# Patient Record
Sex: Male | Born: 1960 | Race: Black or African American | Hispanic: No | Marital: Married | Smoking: Current every day smoker
Health system: Southern US, Community
[De-identification: ages and names within clinical notes are randomized; demographics above are authoritative.]

## PROBLEM LIST (undated history)

## (undated) ENCOUNTER — Emergency Department (HOSPITAL_COMMUNITY): Payer: BLUE CROSS/BLUE SHIELD | Source: Home / Self Care

## (undated) DIAGNOSIS — K759 Inflammatory liver disease, unspecified: Secondary | ICD-10-CM

## (undated) DIAGNOSIS — I10 Essential (primary) hypertension: Secondary | ICD-10-CM

## (undated) DIAGNOSIS — E119 Type 2 diabetes mellitus without complications: Secondary | ICD-10-CM

## (undated) DIAGNOSIS — Z87442 Personal history of urinary calculi: Secondary | ICD-10-CM

## (undated) DIAGNOSIS — N1832 Chronic kidney disease, stage 3b: Secondary | ICD-10-CM

## (undated) DIAGNOSIS — E7800 Pure hypercholesterolemia, unspecified: Secondary | ICD-10-CM

## (undated) HISTORY — PX: TONSILLECTOMY: SUR1361

---

## 2005-05-09 ENCOUNTER — Emergency Department: Payer: Self-pay | Admitting: Emergency Medicine

## 2005-05-09 IMAGING — CT CT ABD-PELV W/O CM
1 of 2 series · 15 of 32 positions shown, 19 images · non-contrast
Comparison: none

REASON FOR EXAM: Abdominal pain, evaluate stone RM 6
COMMENTS:

[Series 2: stone · axial · 0.75mm/px · z∈[+105,+528]mm · 15 of 159 slices shown, 19 images]
[im 12/159  soft-tissue]
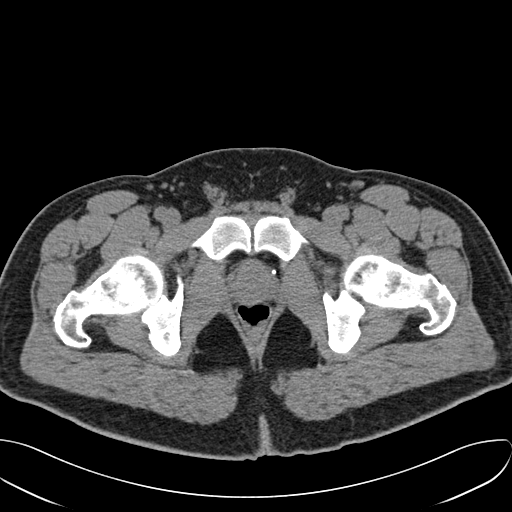
[im 12/159  bone]
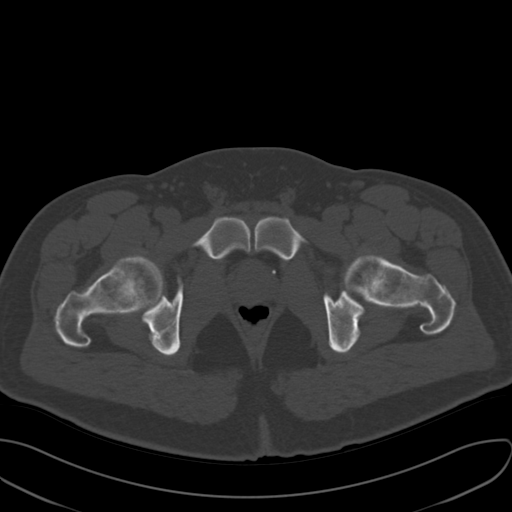
[im 23/159  soft-tissue]
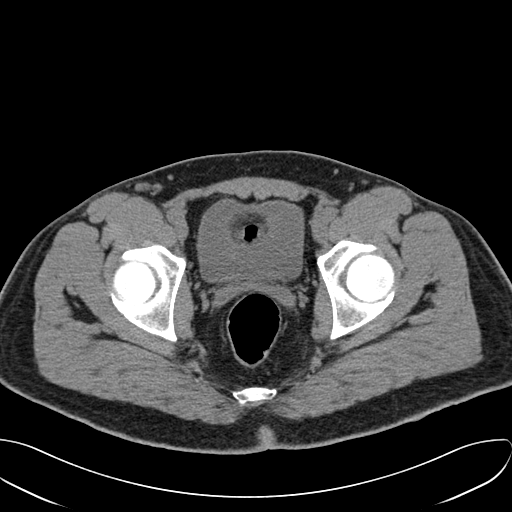
[im 34/159  soft-tissue]
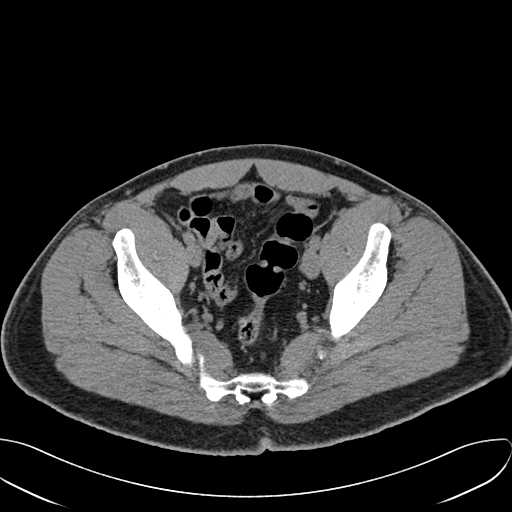
[im 46/159  soft-tissue]
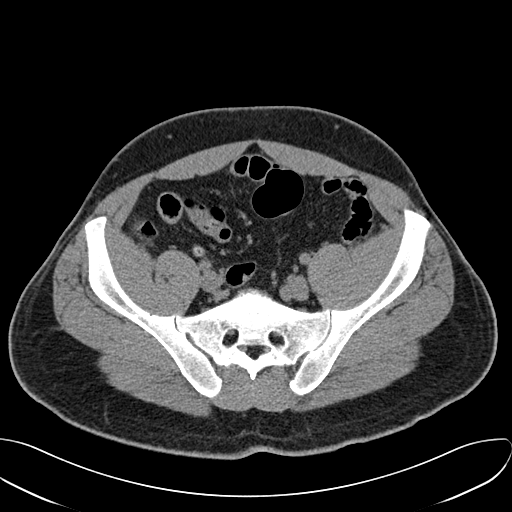
[im 57/159  soft-tissue]
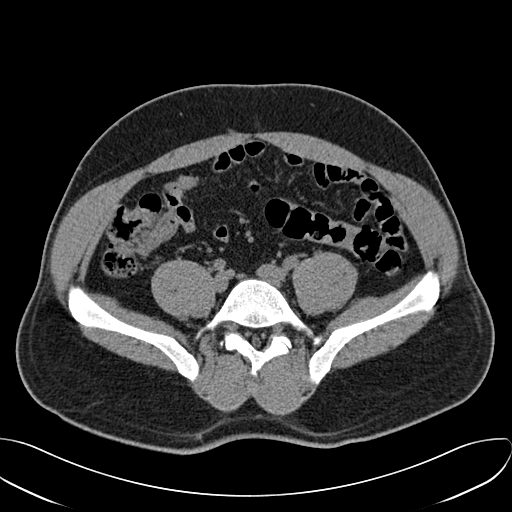
[im 68/159  soft-tissue]
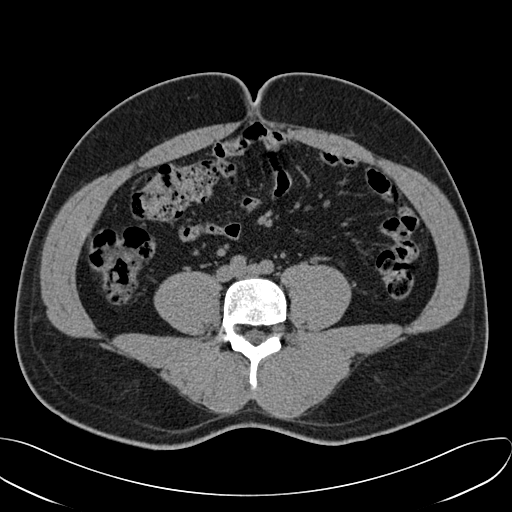
[im 80/159  soft-tissue]
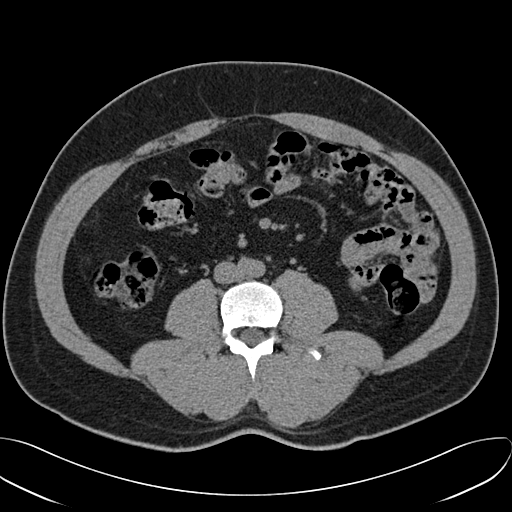
[im 91/159  soft-tissue]
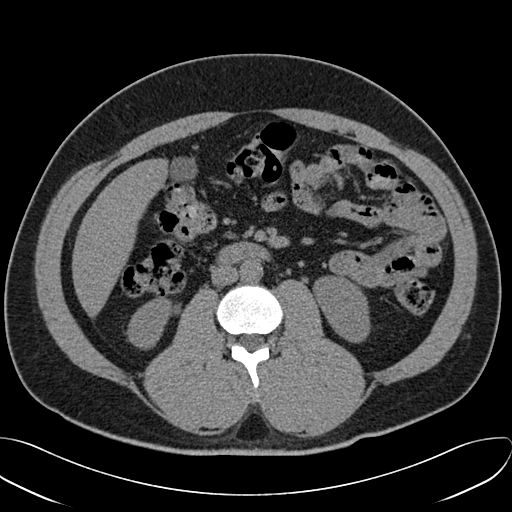
[im 102/159  soft-tissue]
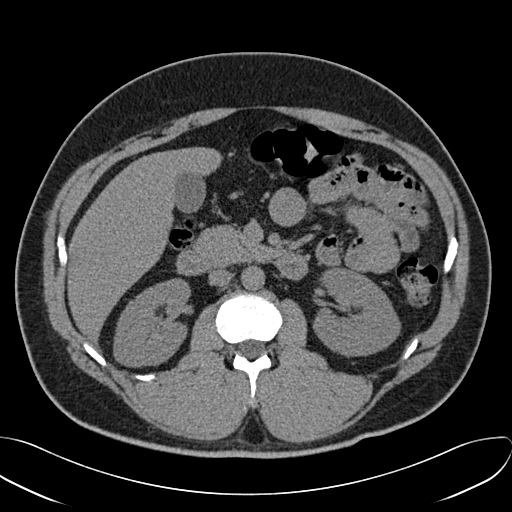
[im 102/159  bone]
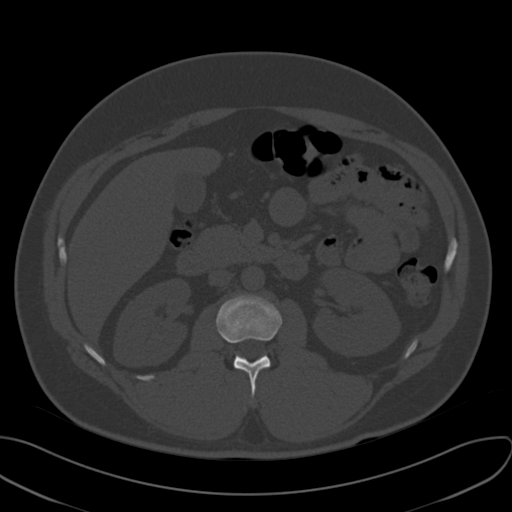
[im 113/159  soft-tissue]
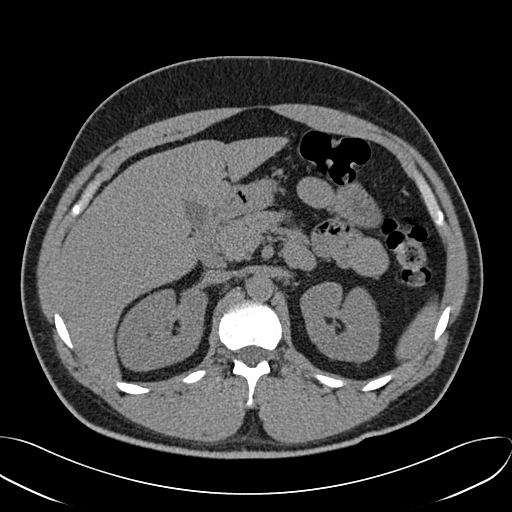
[im 125/159  soft-tissue]
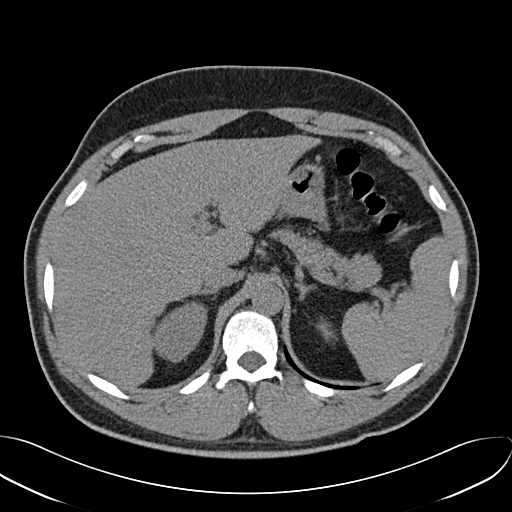
[im 136/159  soft-tissue]
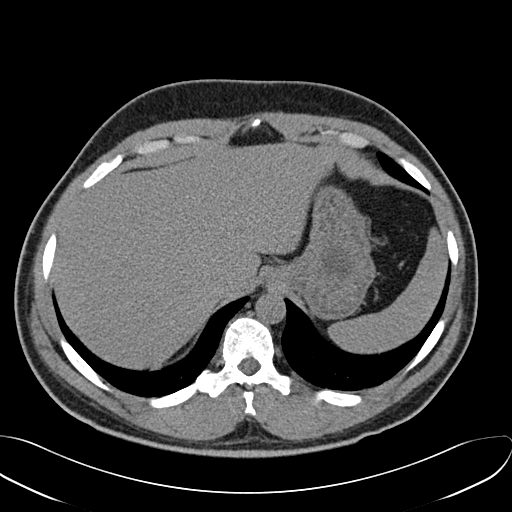
[im 136/159  lung]
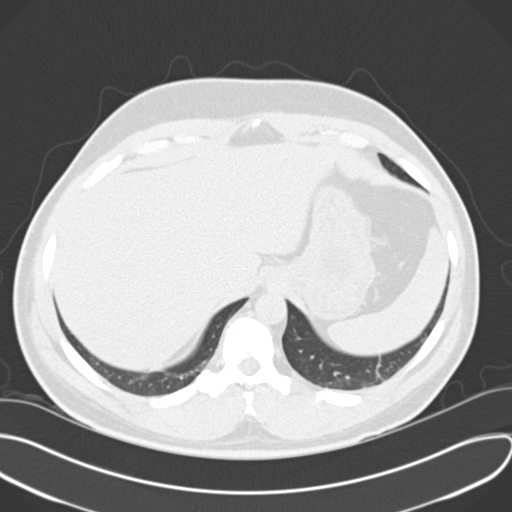
[im 142/159  lung]
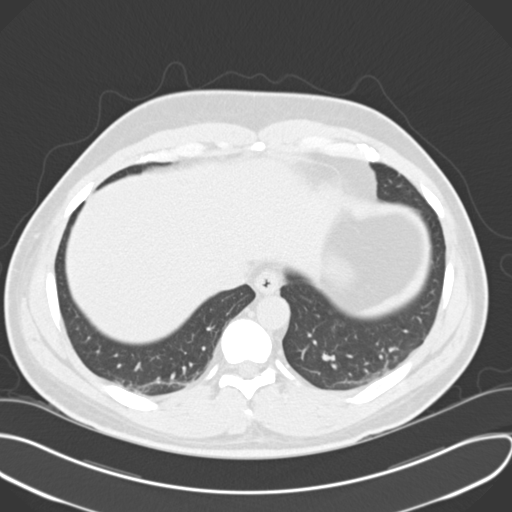
[im 147/159  soft-tissue]
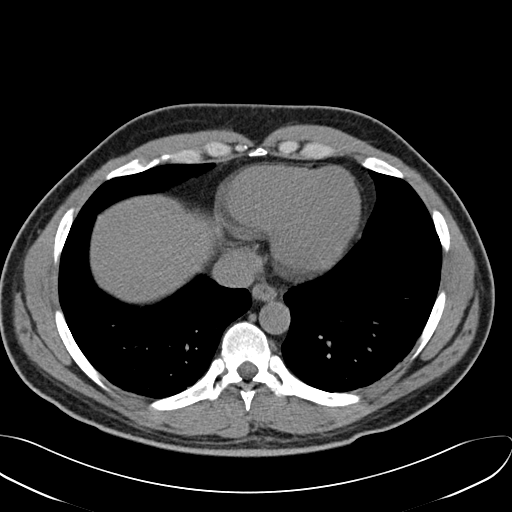
[im 147/159  lung]
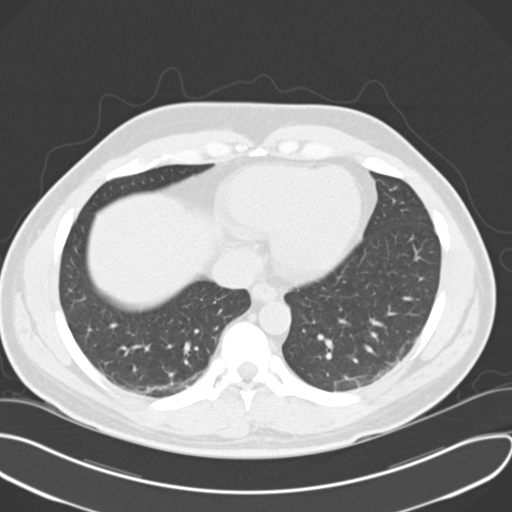
[im 153/159  lung]
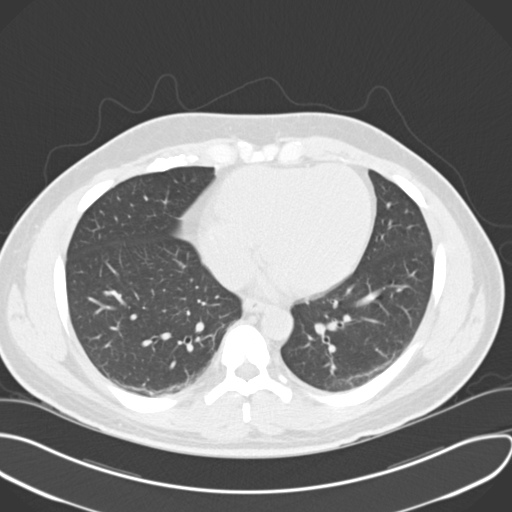

[15 of 32 positions shown; findings below may reference images not displayed]

PROCEDURE:     CT  - CT ABDOMEN AND PELVIS W[DATE] [DATE]

RESULT:     Emergent abdominal and pelvic CT was performed for abdominal
pain.  No renal calculi are identified.  No hydronephrosis is seen.  There
is an approximately 2 mm calculus in the LEFT posterior urinary bladder
which may be secondary to recent passage of a calculus.  No calculi is noted
within the ureters.  No hydroureters are identified.  No fluid is noted in
the abdomen or pelvis.  No evidence of diverticulitis or appendicitis is
noted.  The images through the lung bases reveals the lung bases to be clear
with no effusions.
IMPRESSION: Findings which can be compatible with recent passage of calculus on the LEFT
with a 2-mm calculus in the LEFT urinary bladder.  No hydronephrosis is
seen.  No renal calculi are noted.  The report was called to the emergency
room at the conclusion of the dictation.

## 2005-12-10 ENCOUNTER — Emergency Department: Payer: Self-pay | Admitting: Internal Medicine

## 2016-03-22 ENCOUNTER — Other Ambulatory Visit: Payer: Self-pay | Admitting: Gastroenterology

## 2016-03-22 DIAGNOSIS — B182 Chronic viral hepatitis C: Secondary | ICD-10-CM

## 2016-03-26 ENCOUNTER — Ambulatory Visit
Admission: RE | Admit: 2016-03-26 | Discharge: 2016-03-26 | Disposition: A | Discharge status: Home / Self Care | Payer: BLUE CROSS/BLUE SHIELD | Source: Ambulatory Visit | Attending: Gastroenterology | Admitting: Gastroenterology

## 2016-03-26 ENCOUNTER — Ambulatory Visit: Payer: BLUE CROSS/BLUE SHIELD

## 2016-07-09 ENCOUNTER — Ambulatory Visit
Admission: RE | Admit: 2016-07-09 | Payer: BLUE CROSS/BLUE SHIELD | Source: Ambulatory Visit | Admitting: Gastroenterology

## 2016-07-09 ENCOUNTER — Encounter: Admission: RE | Payer: Self-pay | Source: Ambulatory Visit

## 2016-07-09 SURGERY — COLONOSCOPY WITH PROPOFOL
Anesthesia: General

## 2016-08-07 ENCOUNTER — Other Ambulatory Visit: Payer: Self-pay | Admitting: Gastroenterology

## 2016-08-07 DIAGNOSIS — B1920 Unspecified viral hepatitis C without hepatic coma: Secondary | ICD-10-CM

## 2016-08-15 ENCOUNTER — Ambulatory Visit
Admission: RE | Admit: 2016-08-15 | Discharge: 2016-08-15 | Disposition: A | Discharge status: Home / Self Care | Payer: BLUE CROSS/BLUE SHIELD | Source: Ambulatory Visit | Attending: Gastroenterology | Admitting: Gastroenterology

## 2016-08-15 DIAGNOSIS — B1920 Unspecified viral hepatitis C without hepatic coma: Secondary | ICD-10-CM | POA: Insufficient documentation

## 2016-10-29 ENCOUNTER — Encounter: Payer: Self-pay | Admitting: *Deleted

## 2017-02-10 IMAGING — US US ABDOMEN COMPLETE W/ ELASTOGRAPHY
1 series · 13 of 25 positions shown · non-contrast
Comparison: None ; correlation CT abdomen and pelvis [DATE]

CLINICAL DATA: Hepatitis-C infection without hepatic coma



[Series 1: us abdomen complete w/ elastography · 0.22mm/px · 13 of 141 slices shown]
[im 1/141]
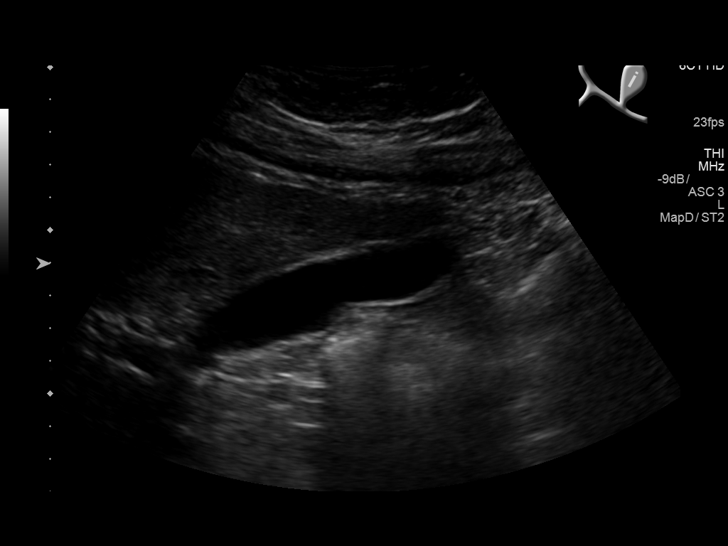
[im 12/141]
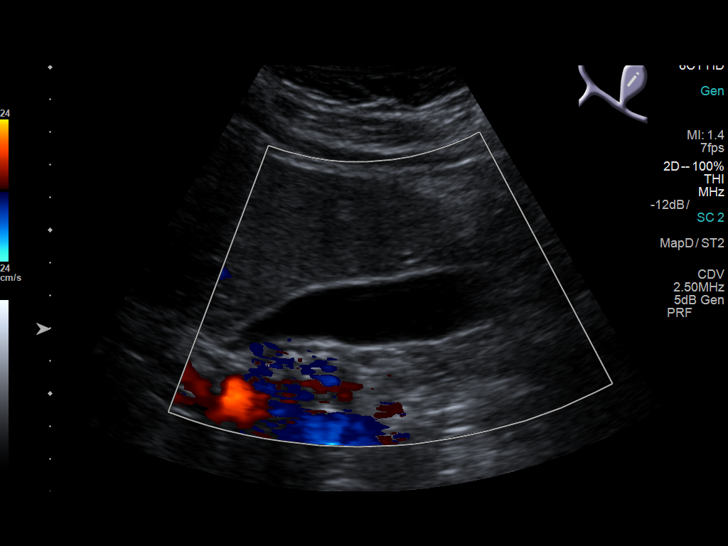
[im 24/141]
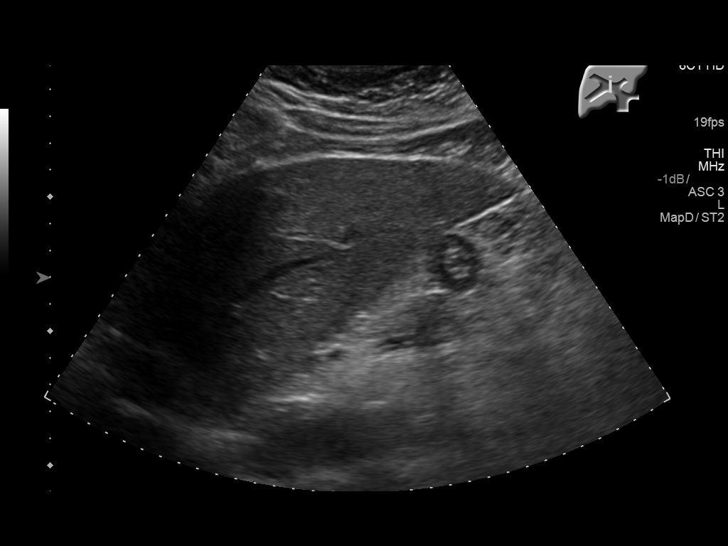
[im 36/141]
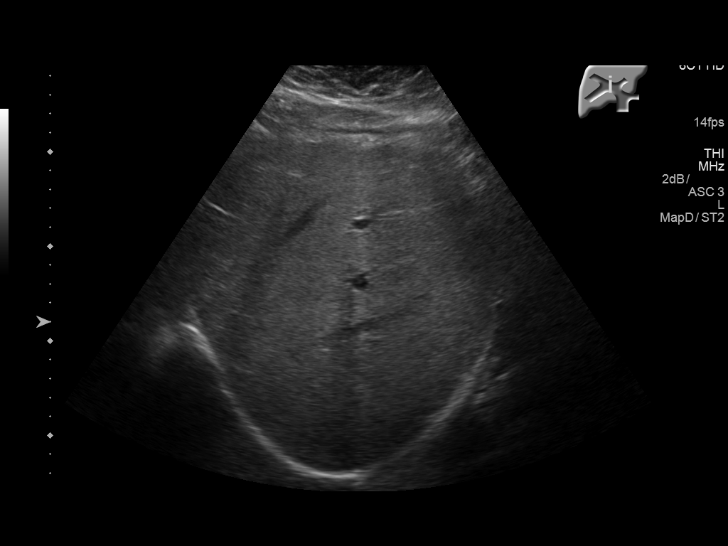
[im 47/141]
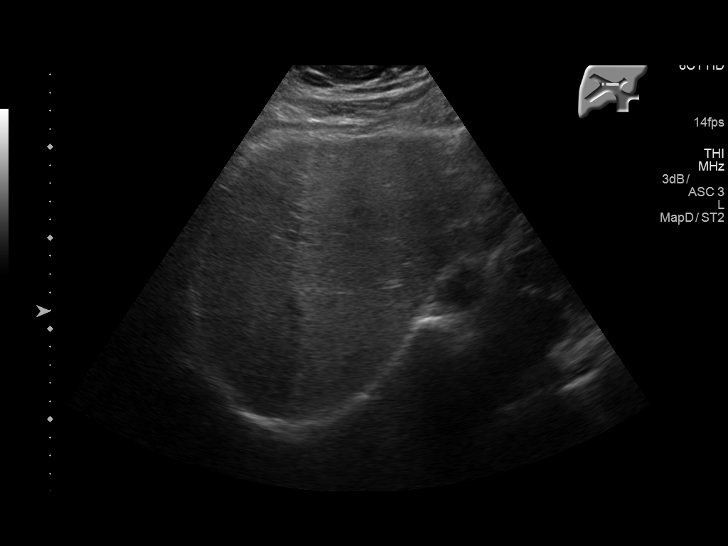
[im 59/141]
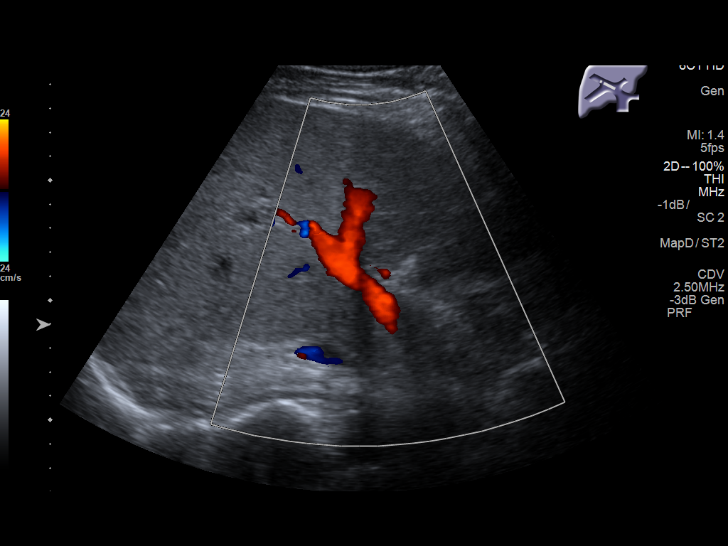
[im 71/141]
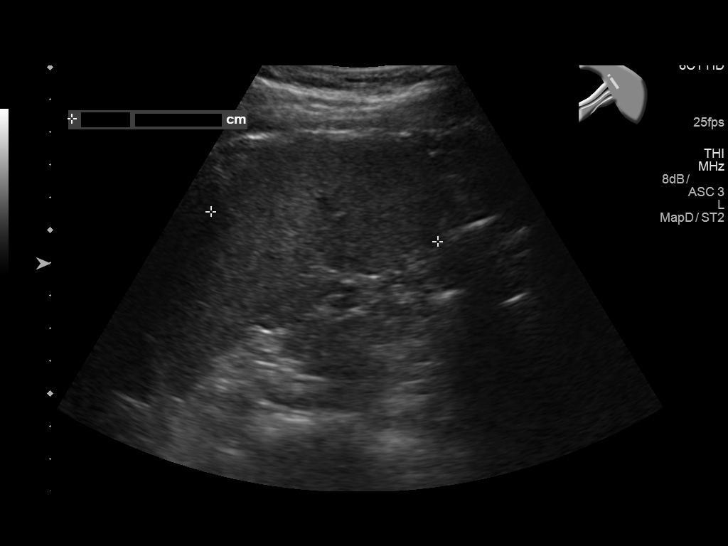
[im 82/141]
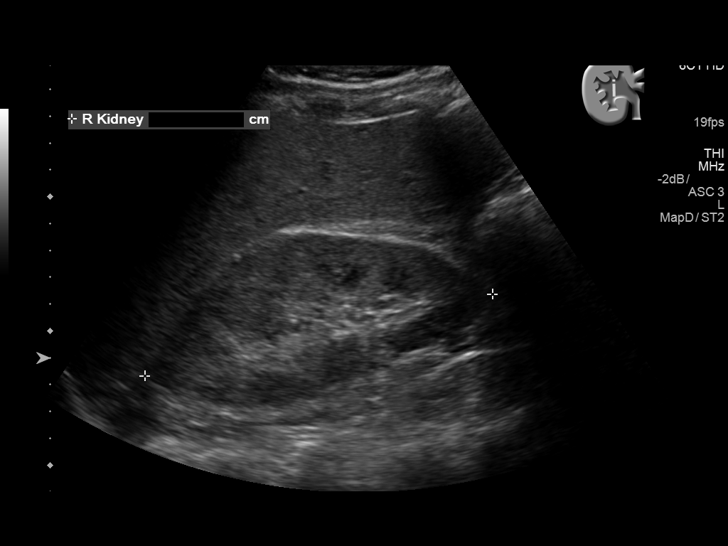
[im 94/141]
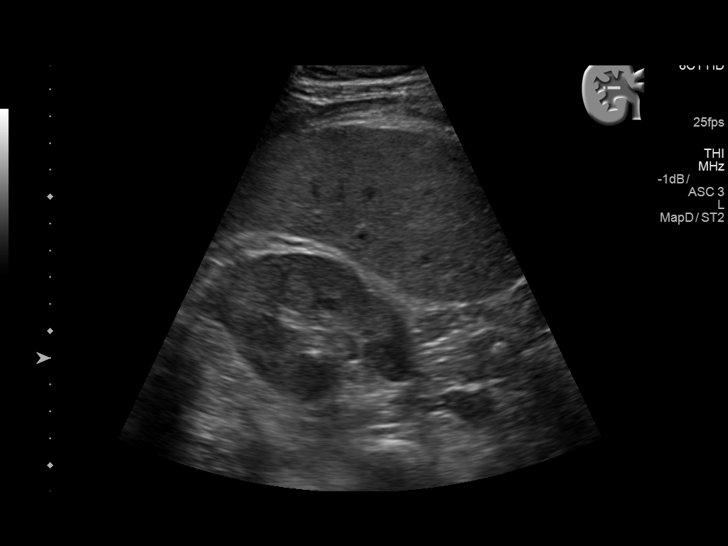
[im 106/141]
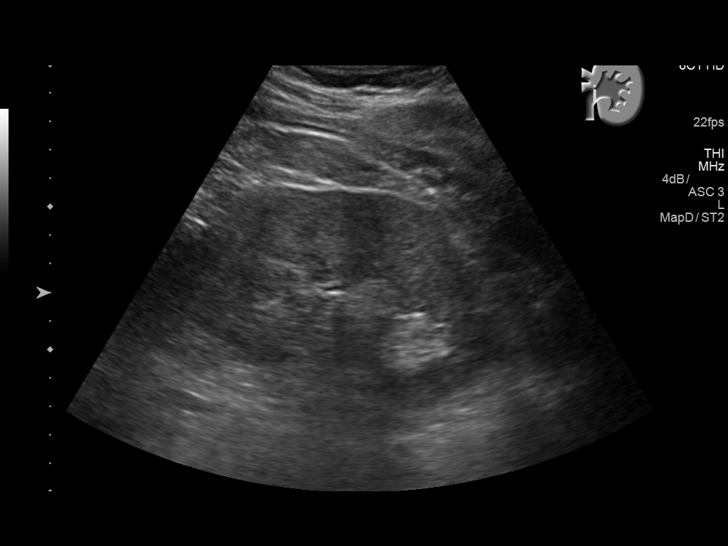
[im 117/141]
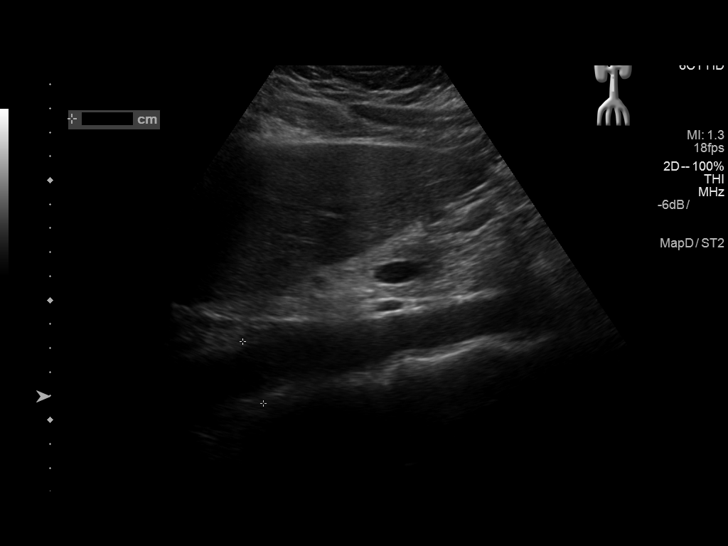
[im 129/141]
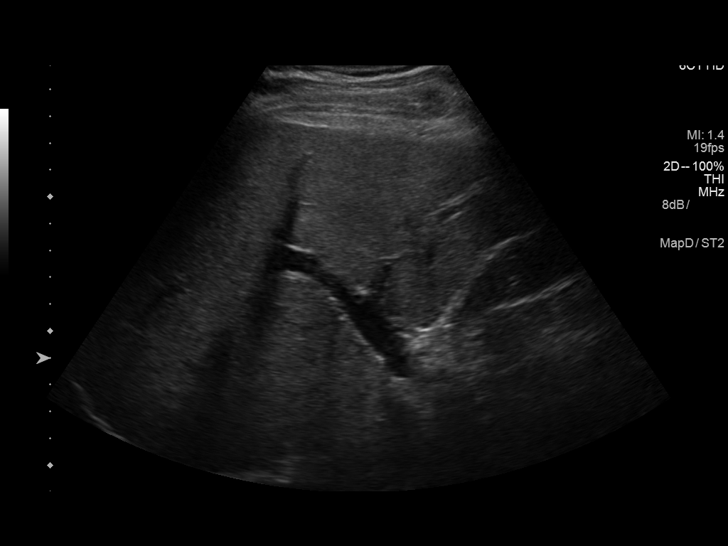
[im 141/141]
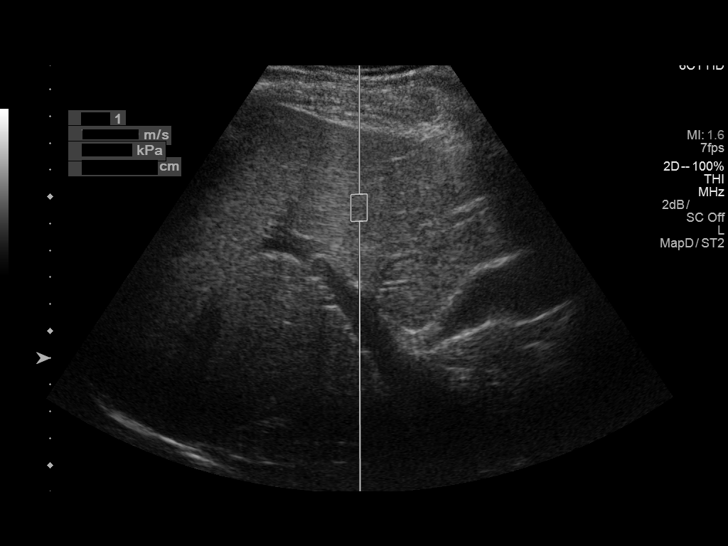

[13 of 25 positions shown; findings below may reference images not displayed]

FINDINGS: ULTRASOUND ABDOMEN

Gallbladder: Normally distended without stones or wall thickening.
No pericholecystic fluid or sonographic Murphy sign.

Common bile duct: Diameter: 3 mm diameter , normal

Liver: Minimally increased echogenicity. No definite mass or
nodules. Hepatopetal portal venous flow.

IVC: Normal appearance

Pancreas: Normal appearance

Spleen: Normal appearance, 8.6 cm length

Right Kidney: Length: 13.3 cm. Normal morphology without mass or
hydronephrosis.

Left Kidney: Length: 12.9 cm. Normal morphology without mass or
hydronephrosis.

Abdominal aorta: Normal caliber

Other findings: No free fluid

ULTRASOUND HEPATIC ELASTOGRAPHY

Device: Siemens Helix VTQ

Patient position: Supine

Transducer [id number]

Number of measurements: 10

Hepatic segment:  8

Median velocity:   1.74  m/sec

IQR:

IQR/Median velocity ratio: 0.1-6

Corresponding Metavir fibrosis score: F2 & some F3

Risk of fibrosis: Moderate

Limitations of exam: None

Pertinent findings noted on other imaging exams:  None

Please note that abnormal shear wave velocities may also be
identified in clinical settings other than with hepatic fibrosis,
such as: acute hepatitis, elevated right heart and central venous
pressures including use of beta blockers, AGAETISSON disease
(AGAETISSON), infiltrative processes such as
mastocytosis/amyloidosis/infiltrative tumor, extrahepatic
cholestasis, in the post-prandial state, and liver transplantation.
Correlation with patient history, laboratory data, and clinical
condition recommended.
IMPRESSION: ULTRASOUND ABDOMEN:
Increased hepatic echogenicity which can be seen with fatty
infiltration, cirrhosis, and certain infiltrative disorders

ULTRASOUND HEPATIC ELASTOGRAPHY:

Median hepatic shear wave velocity is calculated at 1.74 m/sec.

Corresponding Metavir fibrosis score is  F2 & some F3 .

Risk of fibrosis is moderate.

Follow-up: Additional testing appropriate

## 2017-03-11 ENCOUNTER — Encounter: Admission: RE | Payer: Self-pay | Source: Ambulatory Visit

## 2017-03-11 ENCOUNTER — Ambulatory Visit
Admission: RE | Admit: 2017-03-11 | Payer: BLUE CROSS/BLUE SHIELD | Source: Ambulatory Visit | Admitting: Gastroenterology

## 2017-03-11 HISTORY — DX: Inflammatory liver disease, unspecified: K75.9

## 2017-03-11 HISTORY — DX: Type 2 diabetes mellitus without complications: E11.9

## 2017-03-11 HISTORY — DX: Essential (primary) hypertension: I10

## 2017-03-11 HISTORY — DX: Pure hypercholesterolemia, unspecified: E78.00

## 2017-03-11 SURGERY — COLONOSCOPY WITH PROPOFOL
Anesthesia: General

## 2017-04-09 ENCOUNTER — Emergency Department: Payer: BLUE CROSS/BLUE SHIELD

## 2017-04-09 ENCOUNTER — Other Ambulatory Visit: Payer: Self-pay

## 2017-04-09 ENCOUNTER — Emergency Department
Admission: EM | Admit: 2017-04-09 | Discharge: 2017-04-09 | Disposition: A | Discharge status: Home / Self Care | Payer: BLUE CROSS/BLUE SHIELD | Attending: Emergency Medicine | Admitting: Emergency Medicine

## 2017-04-09 ENCOUNTER — Encounter: Payer: Self-pay | Admitting: Emergency Medicine

## 2017-04-09 DIAGNOSIS — I10 Essential (primary) hypertension: Secondary | ICD-10-CM | POA: Diagnosis not present

## 2017-04-09 DIAGNOSIS — Z79899 Other long term (current) drug therapy: Secondary | ICD-10-CM | POA: Diagnosis not present

## 2017-04-09 DIAGNOSIS — F17200 Nicotine dependence, unspecified, uncomplicated: Secondary | ICD-10-CM | POA: Insufficient documentation

## 2017-04-09 DIAGNOSIS — M5137 Other intervertebral disc degeneration, lumbosacral region: Secondary | ICD-10-CM | POA: Diagnosis not present

## 2017-04-09 DIAGNOSIS — R202 Paresthesia of skin: Secondary | ICD-10-CM | POA: Diagnosis not present

## 2017-04-09 DIAGNOSIS — Z794 Long term (current) use of insulin: Secondary | ICD-10-CM | POA: Insufficient documentation

## 2017-04-09 DIAGNOSIS — E119 Type 2 diabetes mellitus without complications: Secondary | ICD-10-CM | POA: Insufficient documentation

## 2017-04-09 DIAGNOSIS — M79604 Pain in right leg: Secondary | ICD-10-CM | POA: Diagnosis present

## 2017-04-09 IMAGING — CR DG LUMBAR SPINE COMPLETE 4+V
1 series · 5 of 5 positions shown · non-contrast
Comparison: None.

CLINICAL DATA: Low back pain

EXAM:
LUMBAR SPINE - COMPLETE 4+ VIEW

[Series 1: dg lumbar spine complete 4 +v · 0.14mm/px · 5 of 5 slices shown]
[im 1/5]
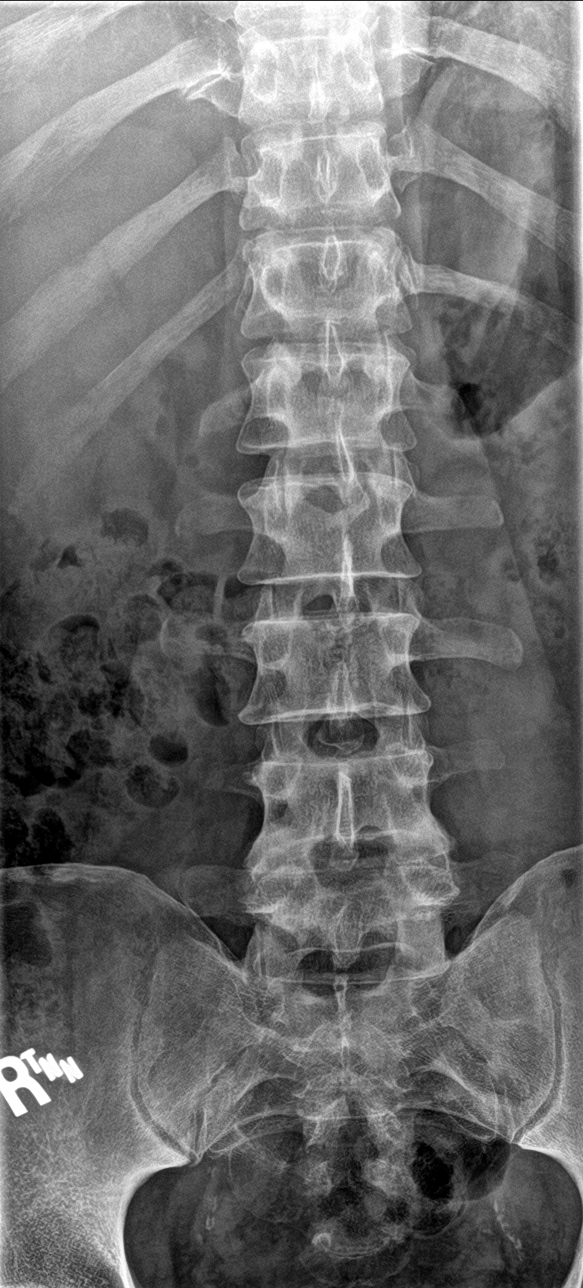
[im 2/5]
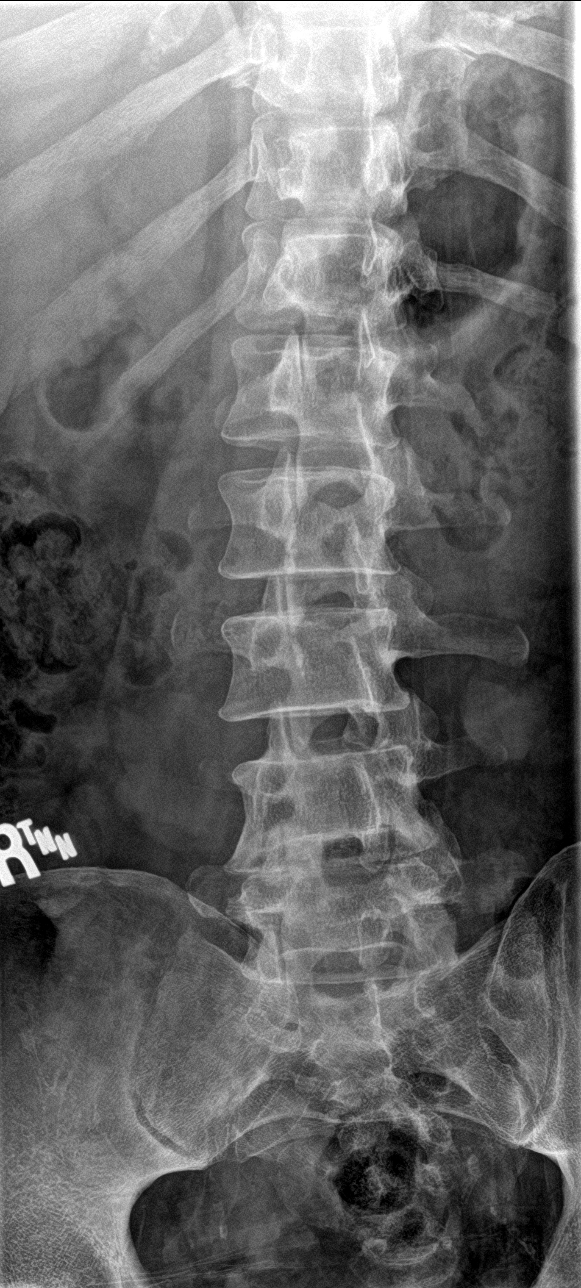
[im 3/5]
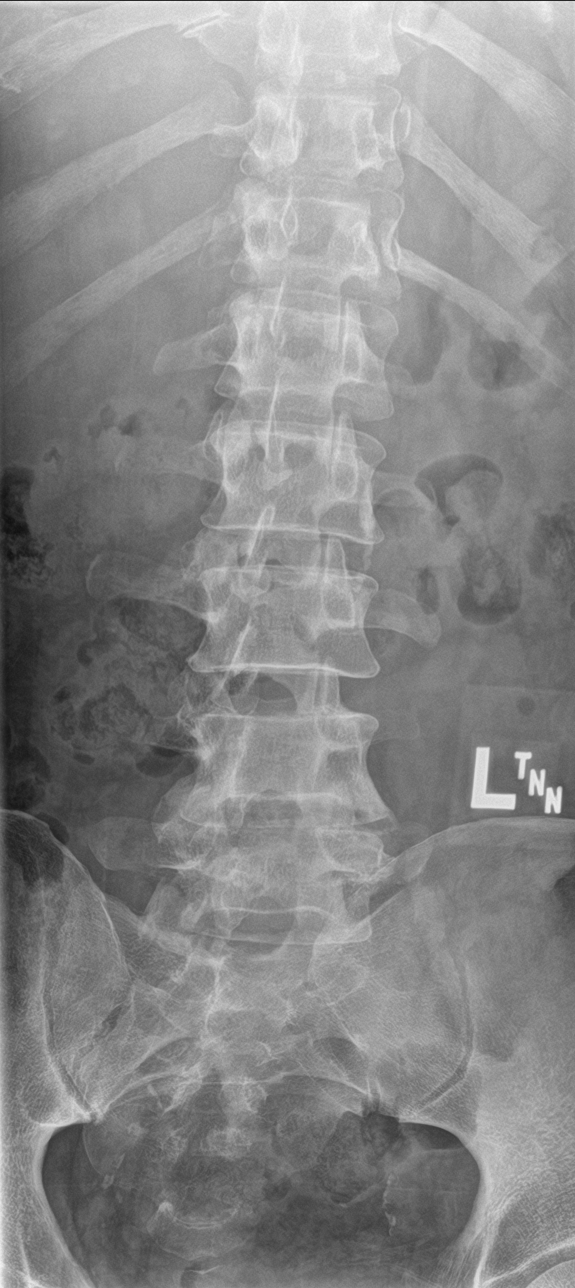
[im 4/5]
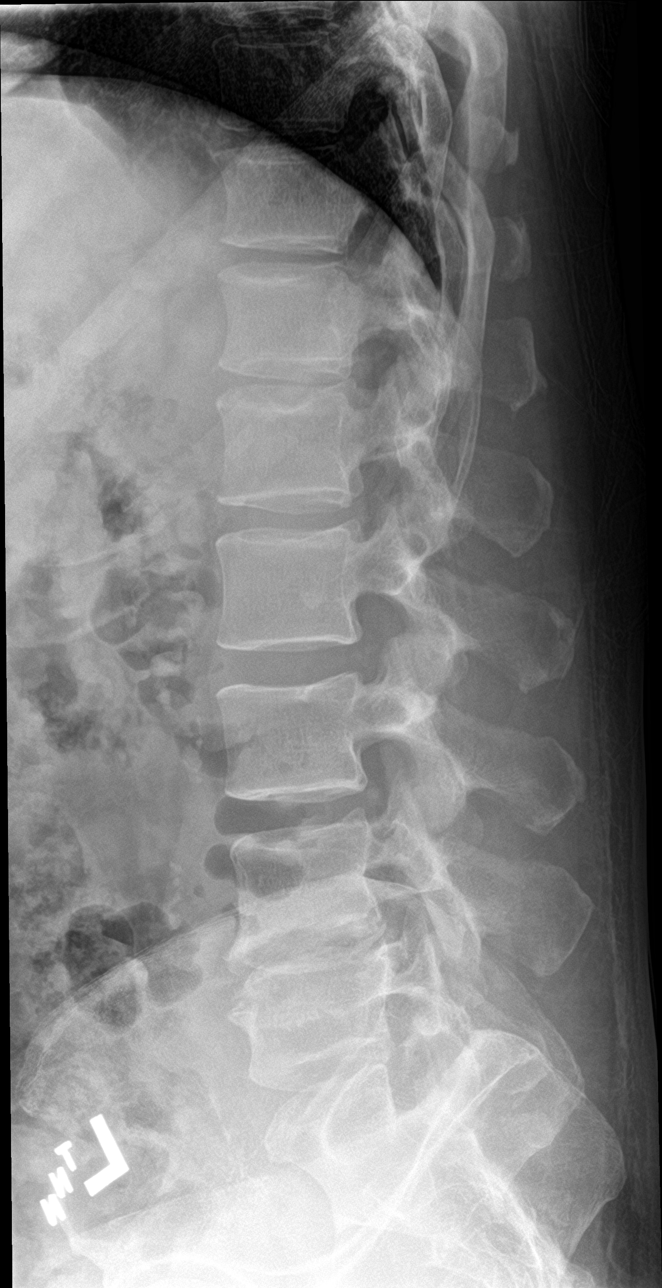
[im 5/5]
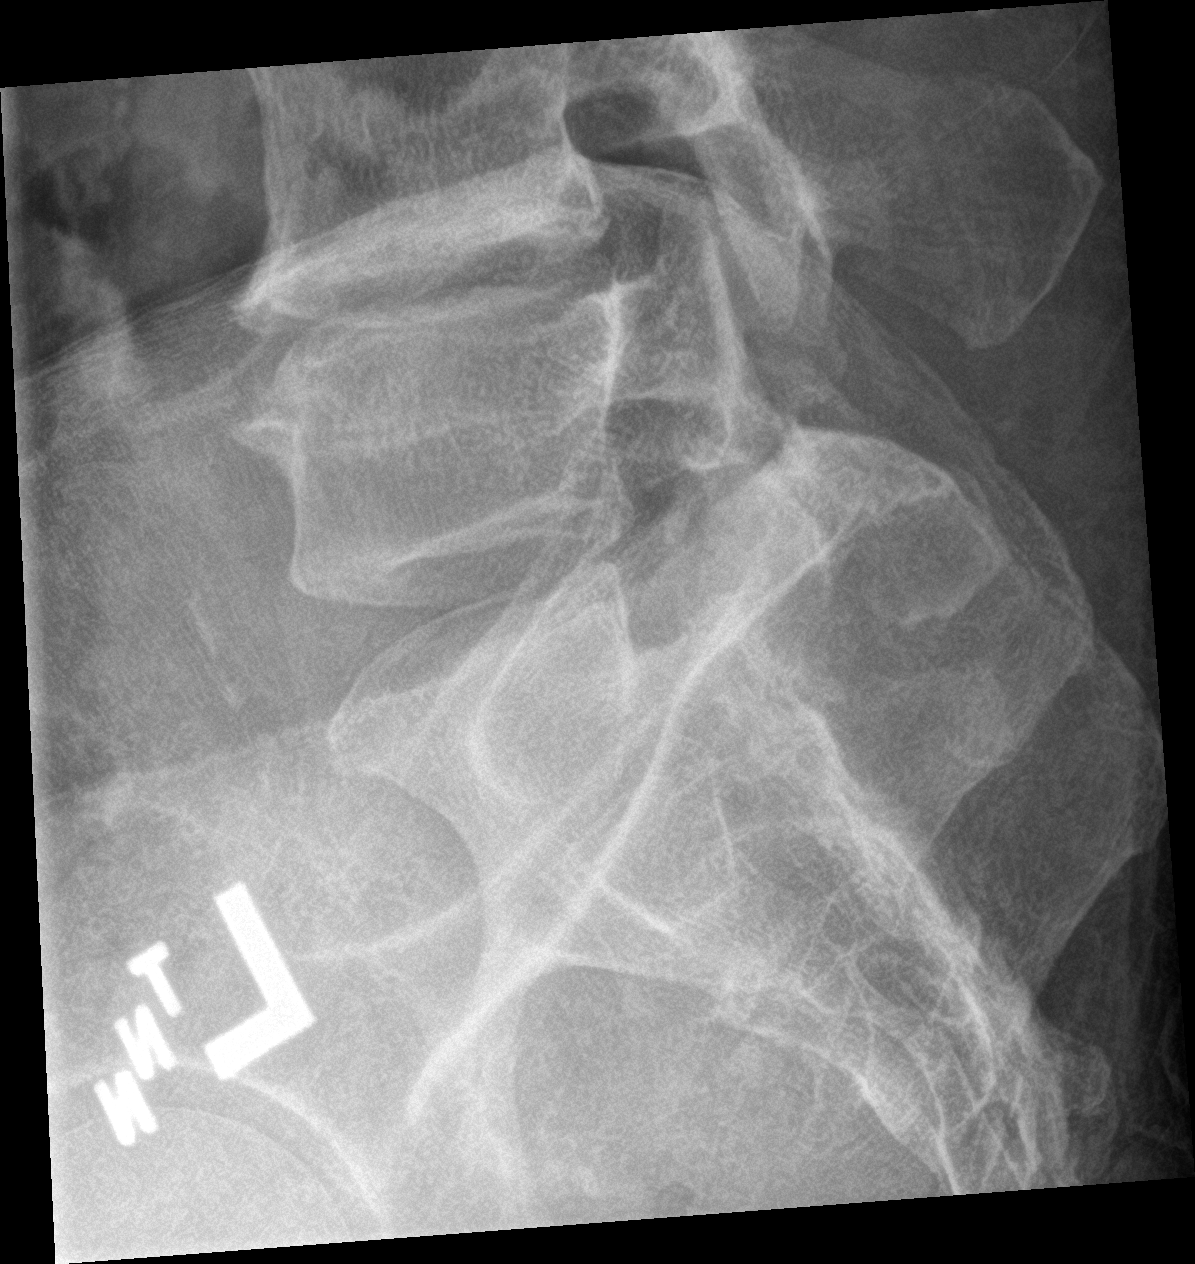

[5 of 5 positions shown; findings below may reference images not displayed]

FINDINGS: There is disc space narrowing with endplate remodeling at L4-L5. The
other disc spaces are preserved. Alignment is normal.
IMPRESSION: Mild lower lumbar degenerative disc disease. No acute fracture or
listhesis of the lumbar spine.

## 2017-04-09 MED ORDER — GABAPENTIN 300 MG PO CAPS: 300.0000 mg | ORAL_CAPSULE | Freq: Two times a day (BID) | ORAL | 0 refills | Status: DC

## 2017-04-09 NOTE — ED Triage Notes (Signed)
C/O intermittent right leg pain x 2 weeks.  Denies injury.

## 2017-04-09 NOTE — Discharge Instructions (Signed)
Your exam is essentially normal. 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 Your symptoms of numbness and tingling are due to nerve irritation. Take the prescription medicine as directed. Follow-up with your provider for continued or worsening symptoms. Be sure to keep your blood sugars under control. Return to the ED for leg weakness, swelling, or foot drop.

## 2017-04-09 NOTE — ED Provider Notes (Signed)
[facility] Emergency Department Provider Note ____________________________________________  Time seen: 1721  I have reviewed the triage vital signs and the nursing notes.  HISTORY  Chief Complaint  Leg Pain  HPI Chase Rodriguez is a 56 y.o. male presents to the ED for evaluation of intermittent right leg and thigh pain for the last 2 weeks.  Patient denies any injury, accident, or trauma.  He reports burning sensation to the anterior lateral thigh but this seems to migrate from the medial thigh to the dorsal lateral thigh to the posterior hip and buttocks.  He denies any distal paresthesias beyond the knee, foot drop, or weakness.  He also denies any skin temp or color changes.  Denies any history of recent fevers, chills, or sweats.  Also denies any low back pain, incontinence, or peripheral edema.  He has a history of hypertension, diabetes, and hepatitis C.  Past Medical History:  Diagnosis Date  . Diabetes mellitus without complication ([place])   . Hepatitis   . High cholesterol   . Hypertension     There are no active problems to display for this patient.   Past Surgical History:  Procedure Laterality Date  . TONSILLECTOMY      Prior to Admission medications   Medication Sig Start Date End Date Taking? Authorizing Provider  atorvastatin (LIPITOR) 20 MG tablet Take 20 mg by mouth daily.    [provider]  gabapentin (NEURONTIN) 300 MG capsule Take 1 capsule (300 mg total) 2 (two) times daily by mouth. 04/09/17 05/09/17  Pachia Strum, Dannielle Karvonen, PA-C  insulin detemir (LEVEMIR) 100 UNIT/ML injection Inject 50 Units into the skin at bedtime.    [provider]  lisinopril-hydrochlorothiazide (PRINZIDE,ZESTORETIC) 20-25 MG tablet Take 1 tablet by mouth daily.    [provider]  metFORMIN (GLUCOPHAGE) 1000 MG tablet Take 1,000 mg by mouth 2 (two) times daily with a meal.    [provider]    Allergies Patient has no  known allergies.  No family history on file.  Social History Social History   Tobacco Use  . Smoking status: Current Every Day Smoker  . Smokeless tobacco: Never Used  Substance Use Topics  . Alcohol use: No  . Drug use: No    Review of Systems  Constitutional: Negative for fever. Cardiovascular: Negative for chest pain. Respiratory: Negative for shortness of breath. Gastrointestinal: Negative for abdominal pain, vomiting and diarrhea. Genitourinary: Negative for dysuria. Musculoskeletal: Negative for back pain. Skin: Negative for rash. Neurological: Negative for headaches, focal weakness or numbness. Right thigh paresthesias as above ____________________________________________  PHYSICAL EXAM:  VITAL SIGNS: ED Triage Vitals  Enc Vitals Group     BP 04/09/17 1708 (!) 157/92     Pulse Rate 04/09/17 1708 96     Resp 04/09/17 1708 16     Temp 04/09/17 1708 98.2 F (36.8 C)     Temp Source 04/09/17 1708 Oral     SpO2 04/09/17 1708 98 %     Weight 04/09/17 1706 198 lb (89.8 kg)     Height 04/09/17 1706 5\' 8"  (1.727 m)     Head Circumference --      Peak Flow --      Pain Score 04/09/17 1706 8     Pain Loc --      Pain Edu? --      Excl. in [place]? --     Constitutional: Alert and oriented. Well appearing and in no distress. Head: Normocephalic and  atraumatic. Eyes: Conjunctivae are normal. Normal extraocular movements Cardiovascular: Normal rate, regular rhythm. Normal distal pulses. Respiratory: Normal respiratory effort. No wheezes/rales/rhonchi. Gastrointestinal: Soft and nontender. No distention. Musculoskeletal: Normal spinal alignment without midline tenderness, spasm, deformity, or step-off.  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 Nontender with normal range of motion in all extremities.  Neurologic: Cranial nerves II through XII grossly intact.  Normal LE DTRs bilaterally.  Normal toe dorsiflexion and foot eversion noted bilaterally.   Normal gait without ataxia. Normal speech and language. No gross focal neurologic deficits are appreciated. Skin:  Skin is warm, dry and intact. No rash noted.  No edema, ecchymosis, or stasis ulcers noted. ___________________________________________   RADIOLOGY  Lumbar Spine  IMPRESSION: Mild lower lumbar degenerative disc disease. No acute fracture or listhesis of the lumbar spine  I, Denis Koppel, Dannielle Karvonen, personally viewed and evaluated these images (plain radiographs) as part of my medical decision making, as well as reviewing the written report by the radiologist. ____________________________________________  INITIAL IMPRESSION / [place] / ED COURSE  Patient with ED evaluation of a 2-week complaint of intermittent right thigh paresthesias.  Patient without any recent injury, accident, or trauma, presents for evaluation.  His exam is overall benign without any acute neuromuscular deficits.  We did perform a plain film x-rays which did reveal some L4-5 DDD.  Overall the x-ray is without acute fracture or dislocation.  No degenerative changes to correlate to his L2-3 dermatomal symptoms.  He is discharged at this time with a prescription for gabapentin to dose as directed.  He is advised to follow-up with primary care provider for ongoing symptoms.  Return precautions are reviewed.  He is encouraged to continue monitor and control his blood sugars with his insulin and oral meds. ____________________________________________  FINAL CLINICAL IMPRESSION(S) / ED DIAGNOSES  Final diagnoses:  Paresthesia of right lower extremity  DDD (degenerative disc disease), lumbosacral      Carmie End, Dannielle Karvonen, PA-C 04/09/17 1917    Harvest Dark, MD 04/09/17 2015

## 2017-04-09 NOTE — ED Notes (Signed)
Pt returned from xray via stretcher.

## 2017-08-19 ENCOUNTER — Encounter: Payer: Self-pay | Admitting: *Deleted

## 2017-08-19 ENCOUNTER — Other Ambulatory Visit: Payer: Self-pay

## 2017-08-19 ENCOUNTER — Emergency Department
Admission: EM | Admit: 2017-08-19 | Discharge: 2017-08-19 | Disposition: A | Discharge status: Home / Self Care | Payer: BLUE CROSS/BLUE SHIELD | Attending: Emergency Medicine | Admitting: Emergency Medicine

## 2017-08-19 DIAGNOSIS — Z794 Long term (current) use of insulin: Secondary | ICD-10-CM | POA: Diagnosis not present

## 2017-08-19 DIAGNOSIS — I10 Essential (primary) hypertension: Secondary | ICD-10-CM | POA: Diagnosis not present

## 2017-08-19 DIAGNOSIS — F17200 Nicotine dependence, unspecified, uncomplicated: Secondary | ICD-10-CM | POA: Diagnosis not present

## 2017-08-19 DIAGNOSIS — G8929 Other chronic pain: Secondary | ICD-10-CM | POA: Insufficient documentation

## 2017-08-19 DIAGNOSIS — M549 Dorsalgia, unspecified: Secondary | ICD-10-CM | POA: Diagnosis not present

## 2017-08-19 DIAGNOSIS — E119 Type 2 diabetes mellitus without complications: Secondary | ICD-10-CM | POA: Insufficient documentation

## 2017-08-19 DIAGNOSIS — M79604 Pain in right leg: Secondary | ICD-10-CM | POA: Diagnosis present

## 2017-08-19 DIAGNOSIS — Z79899 Other long term (current) drug therapy: Secondary | ICD-10-CM | POA: Diagnosis not present

## 2017-08-19 DIAGNOSIS — M79605 Pain in left leg: Secondary | ICD-10-CM | POA: Diagnosis not present

## 2017-08-19 DIAGNOSIS — M7918 Myalgia, other site: Secondary | ICD-10-CM

## 2017-08-19 MED ORDER — MELOXICAM 15 MG PO TABS: 15.0000 mg | ORAL_TABLET | Freq: Every day | ORAL | 2 refills | Status: AC

## 2017-08-19 MED ORDER — GABAPENTIN 300 MG PO CAPS: 300.0000 mg | ORAL_CAPSULE | Freq: Two times a day (BID) | ORAL | 0 refills | Status: DC

## 2017-08-19 NOTE — ED Triage Notes (Signed)
Pt ambulatory with steady gait to triage room, reports pain in the upper back area that comes and goes & constant pain in bilateral legs, symptoms "for three months" States he has been seen for the same, told it was arthritis, but the medication prescribed is not working.

## 2017-08-19 NOTE — ED Provider Notes (Signed)
[facility] Emergency Department Provider Note  ____________________________________________   First MD Initiated Contact with Patient 08/19/17 1947     (approximate)  I have reviewed the triage vital signs and the nursing notes.   HISTORY  Chief Complaint Leg Pain    HPI Chase Rodriguez is a 57 y.o. male presents emergency department complaining on pain in the legs the knee the back.  He states the pain moves to different areas of his body.  He was placed on gabapentin which did not help.  So he did not get the refill.  He states to doctors have told him it is arthritis.  He denies any fever or chills.  Denies any chest pain or shortness of breath.  Past Medical History:  Diagnosis Date  . Diabetes mellitus without complication ([place])   . Hepatitis   . High cholesterol   . Hypertension     There are no active problems to display for this patient.   Past Surgical History:  Procedure Laterality Date  . TONSILLECTOMY      Prior to Admission medications   Medication Sig Start Date End Date Taking? Authorizing Provider  atorvastatin (LIPITOR) 20 MG tablet Take 20 mg by mouth daily.    [provider]  gabapentin (NEURONTIN) 300 MG capsule Take 1 capsule (300 mg total) by mouth 2 (two) times daily. 08/19/17 09/18/17  Erlinda Solinger, Linden Dolin, PA-C  insulin detemir (LEVEMIR) 100 UNIT/ML injection Inject 50 Units into the skin at bedtime.    [provider]  lisinopril-hydrochlorothiazide (PRINZIDE,ZESTORETIC) 20-25 MG tablet Take 1 tablet by mouth daily.    [provider]  meloxicam (MOBIC) 15 MG tablet Take 1 tablet (15 mg total) by mouth daily. 08/19/17 08/19/18  Sidney Kann, Linden Dolin, PA-C  metFORMIN (GLUCOPHAGE) 1000 MG tablet Take 1,000 mg by mouth 2 (two) times daily with a meal.    [provider]    Allergies Patient has no known allergies.  No family history on file.  Social History Social History   Tobacco Use  .  Smoking status: Current Every Day Smoker  . Smokeless tobacco: Never Used  Substance Use Topics  . Alcohol use: No  . Drug use: No    Review of Systems  Constitutional: No fever/chills Eyes: No visual changes. ENT: No sore throat. Respiratory: Denies cough Genitourinary: Negative for dysuria. Musculoskeletal: Positive for back pain.  Positive for leg pain and hip pain Skin: Negative for rash.    ____________________________________________   PHYSICAL EXAM:  VITAL SIGNS: ED Triage Vitals  Enc Vitals Group     BP 08/19/17 1914 (!) 172/98     Pulse Rate 08/19/17 1914 (!) 101     Resp 08/19/17 1914 18     Temp 08/19/17 1914 98.9 F (37.2 C)     Temp Source 08/19/17 1914 Oral     SpO2 08/19/17 1914 96 %     Weight 08/19/17 1915 198 lb (89.8 kg)     Height 08/19/17 1915 5\' 11"  (1.803 m)     Head Circumference --      Peak Flow --      Pain Score 08/19/17 1920 10     Pain Loc --      Pain Edu? --      Excl. in [place]? --     Constitutional: Alert and oriented. Well appearing and in no acute distress. Eyes: Conjunctivae are normal.  Head: Atraumatic. Nose: No congestion/rhinnorhea. Mouth/Throat: Mucous membranes are moist.  Cardiovascular: Normal rate, regular rhythm.  Heart sounds are normal Respiratory: Normal respiratory effort.  No retractions, lungs clear to auscultation GU: deferred Musculoskeletal: FROM all extremities, warm and well perfused there is no exact tenderness noted on the spine hips or knees.,  Patient does have full range of motion.  Neurovascularly is intact Neurologic:  Normal speech and language.  Skin:  Skin is warm, dry and intact. No rash noted. Psychiatric: Mood and affect are normal. Speech and behavior are normal.  ____________________________________________   LABS (all labs ordered are listed, but only abnormal results are displayed)  Labs Reviewed - No data to  display ____________________________________________   ____________________________________________  RADIOLOGY    ____________________________________________   PROCEDURES  Procedure(s) performed: No  Procedures    ____________________________________________   INITIAL IMPRESSION / ASSESSMENT AND PLAN / ED COURSE  Pertinent labs & imaging results that were available during my care of the patient were reviewed by me and considered in my medical decision making (see chart for details).  Patient is 57 year old male that presents to the emergency department complaining of chronic pain in his back, knees and hips.  He is also here with another patient.  He was put on gabapentin but states it did not help.  On physical exam he appears very well.  I cannot locate any exact tender areas.  He has full range of motion is able to ambulate without any difficulty.  He is come to the door and walked down the hall several times without any problems.  Explained the findings to the patient.  Explained that he probably does have arthritis in the see his regular doctor.  He was given a prescription for meloxicam.  He is to continue his gabapentin and other medications.  He states he understands.  He was discharged in stable condition     As part of my medical decision making, I reviewed the following data within the [place] notes reviewed and incorporated, Old chart reviewed, Notes from prior ED visits and [place] Controlled Substance Database  ____________________________________________   FINAL CLINICAL IMPRESSION(S) / ED DIAGNOSES  Final diagnoses:  Musculoskeletal pain, chronic      NEW MEDICATIONS STARTED DURING THIS VISIT:  Discharge Medication List as of 08/19/2017  8:09 PM    START taking these medications   Details  meloxicam (MOBIC) 15 MG tablet Take 1 tablet (15 mg total) by mouth daily., Starting Mon 08/19/2017, Until Tue 08/19/2018, Print          Note:  This document was prepared using Dragon voice recognition software and may include unintentional dictation errors.    Versie Starks, PA-C 08/19/17 2209    Schuyler Amor, MD 08/30/17 [phone]

## 2017-08-19 NOTE — ED Notes (Signed)
Pt to the er for back pain that has no changes. The pain has been going on for 4 months. Seen by your doctor and placed on gabapentin which did not help. Went back to his doctor and placed on methacarbamol which showed no relief. Pt says pain jumps around. Pain also in the left ankle. Pt denies sitting or standing makes no difference. Pt ambulates with a slight limp r/t pain. Pt is diabetic but blood sugar is under control and pt is compliant with meds.

## 2021-03-24 ENCOUNTER — Emergency Department: Payer: Self-pay

## 2021-03-24 ENCOUNTER — Other Ambulatory Visit: Payer: Self-pay

## 2021-03-24 ENCOUNTER — Inpatient Hospital Stay: Payer: Self-pay

## 2021-03-24 ENCOUNTER — Inpatient Hospital Stay
Admission: EM | Admit: 2021-03-24 | Discharge: 2021-03-30 | DRG: 064 | Disposition: A | Discharge status: Home Health | Payer: Self-pay | Attending: Internal Medicine | Admitting: Internal Medicine

## 2021-03-24 DIAGNOSIS — Z23 Encounter for immunization: Secondary | ICD-10-CM

## 2021-03-24 DIAGNOSIS — E119 Type 2 diabetes mellitus without complications: Secondary | ICD-10-CM | POA: Diagnosis present

## 2021-03-24 DIAGNOSIS — E876 Hypokalemia: Secondary | ICD-10-CM | POA: Diagnosis present

## 2021-03-24 DIAGNOSIS — N1832 Chronic kidney disease, stage 3b: Secondary | ICD-10-CM | POA: Diagnosis present

## 2021-03-24 DIAGNOSIS — W19XXXA Unspecified fall, initial encounter: Secondary | ICD-10-CM | POA: Diagnosis present

## 2021-03-24 DIAGNOSIS — Z79899 Other long term (current) drug therapy: Secondary | ICD-10-CM

## 2021-03-24 DIAGNOSIS — G40909 Epilepsy, unspecified, not intractable, without status epilepticus: Secondary | ICD-10-CM

## 2021-03-24 DIAGNOSIS — S064XAA Epidural hemorrhage with loss of consciousness status unknown, initial encounter: Secondary | ICD-10-CM | POA: Diagnosis present

## 2021-03-24 DIAGNOSIS — I6381 Other cerebral infarction due to occlusion or stenosis of small artery: Secondary | ICD-10-CM | POA: Diagnosis present

## 2021-03-24 DIAGNOSIS — Z6832 Body mass index (BMI) 32.0-32.9, adult: Secondary | ICD-10-CM

## 2021-03-24 DIAGNOSIS — E1100 Type 2 diabetes mellitus with hyperosmolarity without nonketotic hyperglycemic-hyperosmolar coma (NKHHC): Secondary | ICD-10-CM

## 2021-03-24 DIAGNOSIS — N179 Acute kidney failure, unspecified: Secondary | ICD-10-CM | POA: Diagnosis present

## 2021-03-24 DIAGNOSIS — E7800 Pure hypercholesterolemia, unspecified: Secondary | ICD-10-CM | POA: Diagnosis present

## 2021-03-24 DIAGNOSIS — E1165 Type 2 diabetes mellitus with hyperglycemia: Secondary | ICD-10-CM | POA: Diagnosis present

## 2021-03-24 DIAGNOSIS — E669 Obesity, unspecified: Secondary | ICD-10-CM | POA: Diagnosis present

## 2021-03-24 DIAGNOSIS — I160 Hypertensive urgency: Secondary | ICD-10-CM | POA: Diagnosis present

## 2021-03-24 DIAGNOSIS — E785 Hyperlipidemia, unspecified: Secondary | ICD-10-CM | POA: Diagnosis present

## 2021-03-24 DIAGNOSIS — Z7984 Long term (current) use of oral hypoglycemic drugs: Secondary | ICD-10-CM

## 2021-03-24 DIAGNOSIS — Z716 Tobacco abuse counseling: Secondary | ICD-10-CM

## 2021-03-24 DIAGNOSIS — Z794 Long term (current) use of insulin: Secondary | ICD-10-CM

## 2021-03-24 DIAGNOSIS — I639 Cerebral infarction, unspecified: Principal | ICD-10-CM | POA: Diagnosis present

## 2021-03-24 DIAGNOSIS — Z8673 Personal history of transient ischemic attack (TIA), and cerebral infarction without residual deficits: Secondary | ICD-10-CM

## 2021-03-24 DIAGNOSIS — S065XAA Traumatic subdural hemorrhage with loss of consciousness status unknown, initial encounter: Secondary | ICD-10-CM | POA: Diagnosis present

## 2021-03-24 DIAGNOSIS — F17210 Nicotine dependence, cigarettes, uncomplicated: Secondary | ICD-10-CM | POA: Diagnosis present

## 2021-03-24 DIAGNOSIS — B182 Chronic viral hepatitis C: Secondary | ICD-10-CM | POA: Diagnosis present

## 2021-03-24 DIAGNOSIS — Z91120 Patient's intentional underdosing of medication regimen due to financial hardship: Secondary | ICD-10-CM

## 2021-03-24 DIAGNOSIS — R569 Unspecified convulsions: Secondary | ICD-10-CM | POA: Diagnosis present

## 2021-03-24 DIAGNOSIS — Z452 Encounter for adjustment and management of vascular access device: Secondary | ICD-10-CM

## 2021-03-24 DIAGNOSIS — I129 Hypertensive chronic kidney disease with stage 1 through stage 4 chronic kidney disease, or unspecified chronic kidney disease: Secondary | ICD-10-CM | POA: Diagnosis present

## 2021-03-24 DIAGNOSIS — Z20822 Contact with and (suspected) exposure to covid-19: Secondary | ICD-10-CM | POA: Diagnosis present

## 2021-03-24 DIAGNOSIS — E1122 Type 2 diabetes mellitus with diabetic chronic kidney disease: Secondary | ICD-10-CM | POA: Diagnosis present

## 2021-03-24 DIAGNOSIS — I10 Essential (primary) hypertension: Secondary | ICD-10-CM | POA: Diagnosis present

## 2021-03-24 HISTORY — DX: Chronic kidney disease, stage 3b: N18.32

## 2021-03-24 LAB — HEPATIC FUNCTION PANEL
ALT: 12 U/L (ref 0–44)
AST: 21 U/L (ref 15–41)
Albumin: 3.1 g/dL — ABNORMAL LOW (ref 3.5–5.0)
Alkaline Phosphatase: 65 U/L (ref 38–126)
Bilirubin, Direct: <0.1 mg/dL (ref 0.0–0.2)
Total Bilirubin: 0.5 mg/dL (ref 0.3–1.2)
Total Protein: 7.4 g/dL (ref 6.5–8.1)

## 2021-03-24 LAB — CBC
HCT: 42 % (ref 39.0–52.0)
Hemoglobin: 15.1 g/dL (ref 13.0–17.0)
MCH: 29.2 pg (ref 26.0–34.0)
MCHC: 36 g/dL (ref 30.0–36.0)
MCV: 81.1 fL (ref 80.0–100.0)
Platelets: 249 10*3/uL (ref 150–400)
RBC: 5.18 MIL/uL (ref 4.22–5.81)
RDW: 13.3 % (ref 11.5–15.5)
WBC: 8.7 10*3/uL (ref 4.0–10.5)
nRBC: 0 % (ref 0.0–0.2)

## 2021-03-24 LAB — BASIC METABOLIC PANEL
Anion gap: 11 (ref 5–15)
BUN: 23 mg/dL — ABNORMAL HIGH (ref 6–20)
CO2: 28 mmol/L (ref 22–32)
Calcium: 8.3 mg/dL — ABNORMAL LOW (ref 8.9–10.3)
Chloride: 88 mmol/L — ABNORMAL LOW (ref 98–111)
Creatinine, Ser: 2.48 mg/dL — ABNORMAL HIGH (ref 0.61–1.24)
GFR, Estimated: 29 mL/min — ABNORMAL LOW (ref >60)
Glucose, Bld: 768 mg/dL (ref 70–99)
Potassium: 2.3 mmol/L — CL (ref 3.5–5.1)
Sodium: 127 mmol/L — ABNORMAL LOW (ref 135–145)

## 2021-03-24 LAB — BLOOD GAS, VENOUS
Acid-Base Excess: 6.4 mmol/L — ABNORMAL HIGH (ref 0.0–2.0)
Bicarbonate: 32.8 mmol/L — ABNORMAL HIGH (ref 20.0–28.0)
O2 Saturation: 63.6 %
Patient temperature: 37
pCO2, Ven: 53 mmHg (ref 44.0–60.0)
pH, Ven: 7.4 (ref 7.250–7.430)
pO2, Ven: 33 mmHg (ref 32.0–45.0)

## 2021-03-24 LAB — URINALYSIS, COMPLETE (UACMP) WITH MICROSCOPIC
Bacteria, UA: NONE SEEN
Bilirubin Urine: NEGATIVE
Glucose, UA: >=500 mg/dL — AB
Ketones, ur: NEGATIVE mg/dL
Leukocytes,Ua: NEGATIVE
Nitrite: NEGATIVE
Protein, ur: >=300 mg/dL — AB
Specific Gravity, Urine: 1.016 (ref 1.005–1.030)
WBC, UA: NONE SEEN WBC/hpf (ref 0–5)
pH: 6 (ref 5.0–8.0)

## 2021-03-24 LAB — BETA-HYDROXYBUTYRIC ACID: Beta-Hydroxybutyric Acid: 0.12 mmol/L (ref 0.05–0.27)

## 2021-03-24 LAB — PROTIME-INR
INR: 1 (ref 0.8–1.2)
Prothrombin Time: 12.9 seconds (ref 11.4–15.2)

## 2021-03-24 LAB — MAGNESIUM: Magnesium: 1.4 mg/dL — ABNORMAL LOW (ref 1.7–2.4)

## 2021-03-24 LAB — CBG MONITORING, ED
Glucose-Capillary: 444 mg/dL — ABNORMAL HIGH (ref 70–99)
Glucose-Capillary: 449 mg/dL — ABNORMAL HIGH (ref 70–99)

## 2021-03-24 LAB — RESP PANEL BY RT-PCR (FLU A&B, COVID) ARPGX2
Influenza A by PCR: NEGATIVE
Influenza B by PCR: NEGATIVE
SARS Coronavirus 2 by RT PCR: NEGATIVE

## 2021-03-24 LAB — OSMOLALITY: Osmolality: 306 mOsm/kg — ABNORMAL HIGH (ref 275–295)

## 2021-03-24 LAB — TROPONIN I (HIGH SENSITIVITY): Troponin I (High Sensitivity): 88 ng/L — ABNORMAL HIGH (ref <18)

## 2021-03-24 LAB — APTT: aPTT: 37 seconds — ABNORMAL HIGH (ref 24–36)

## 2021-03-24 IMAGING — CT CT HEAD W/O CM
3 series · 15 of 47 positions shown, 18 images · non-contrast
Comparison: None.

CLINICAL DATA: Seizure, amnesia

EXAM:
CT HEAD WITHOUT CONTRAST
TECHNIQUE: Contiguous axial images were obtained from the base of the skull
through the vertex without intravenous contrast.

[Series 2: head wo · axial · 0.42mm/px · z∈[+312,+437]mm · 9 of 31 slices shown, 12 images]
[im 3/31  brain]
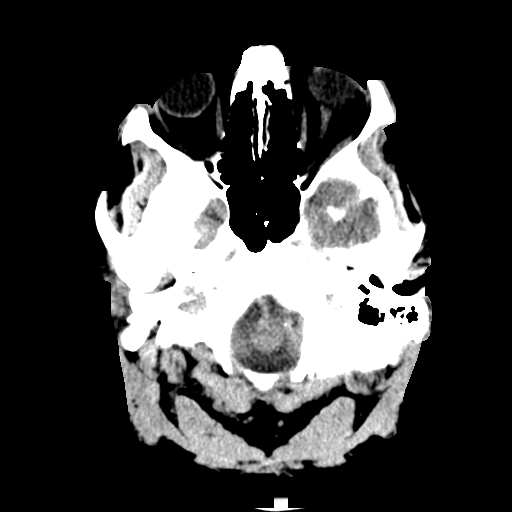
[im 3/31  bone]
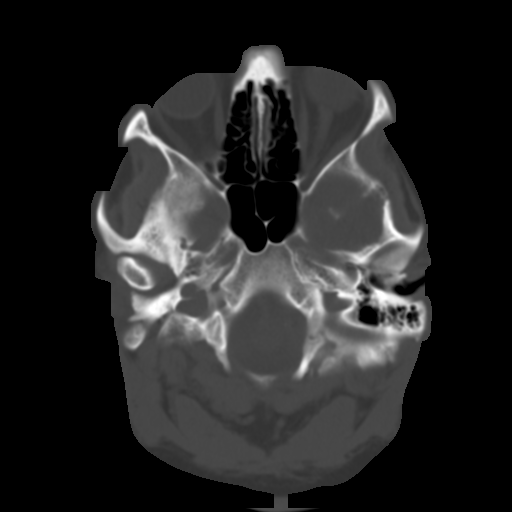
[im 6/31  brain]
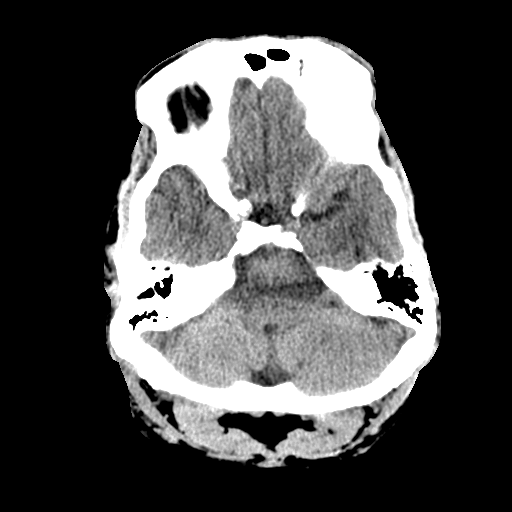
[im 9/31  brain]
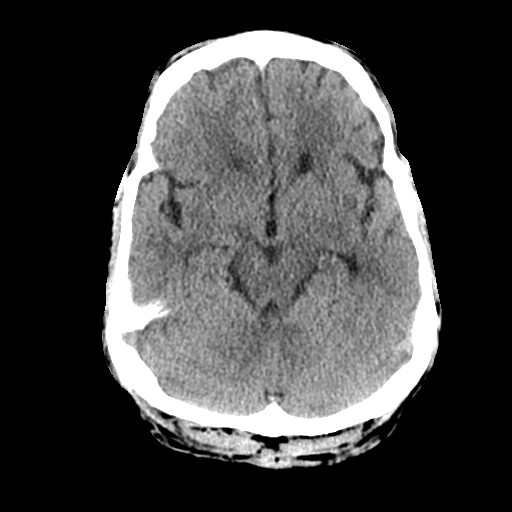
[im 12/31  brain]
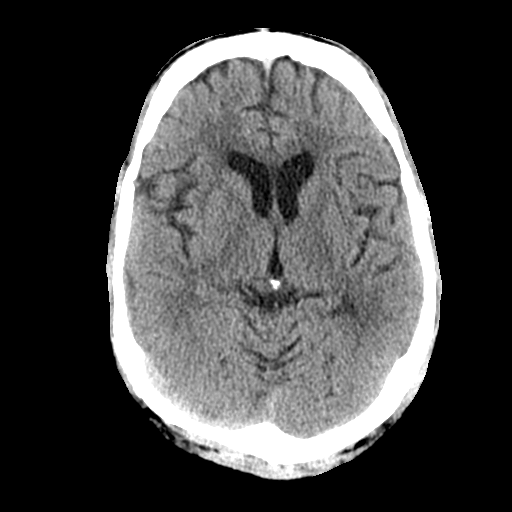
[im 16/31  brain]
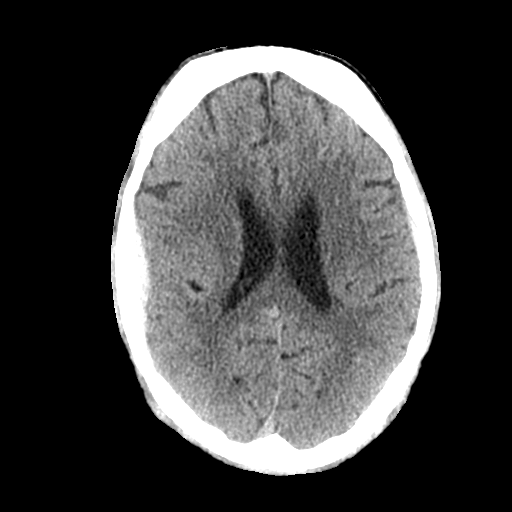
[im 16/31  bone]
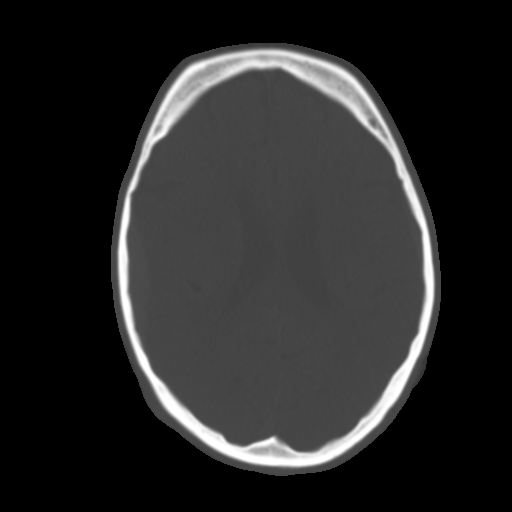
[im 19/31  brain]
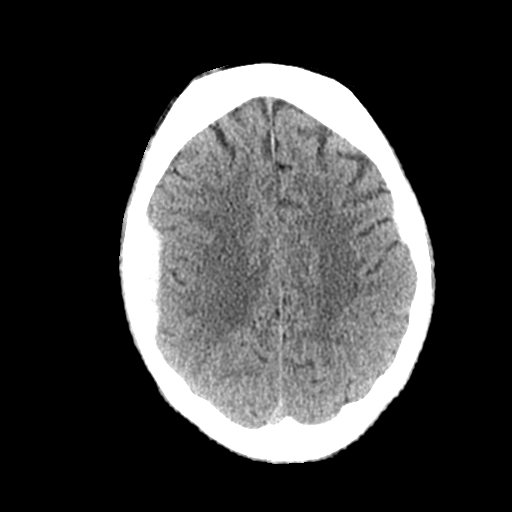
[im 22/31  brain]
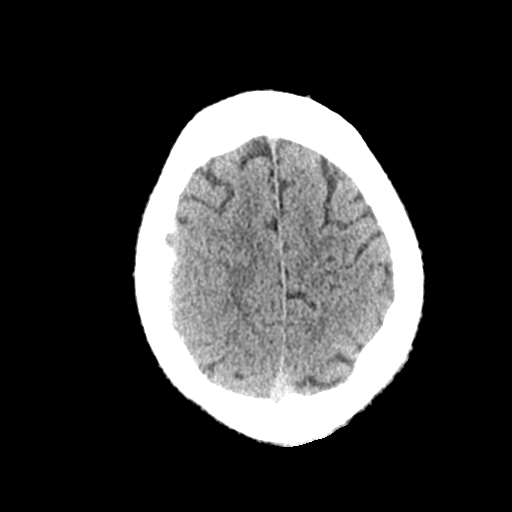
[im 25/31  brain]
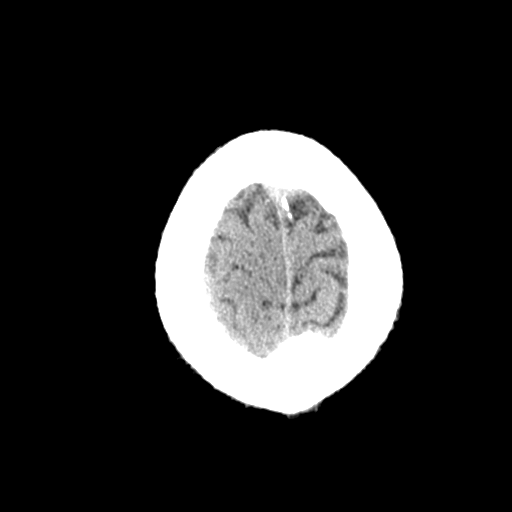
[im 28/31  brain]
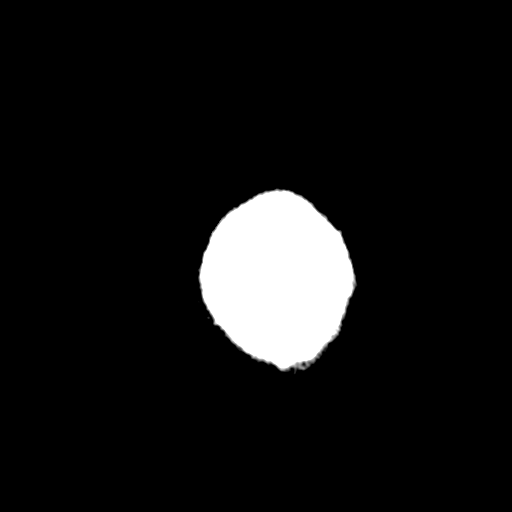
[im 28/31  bone]
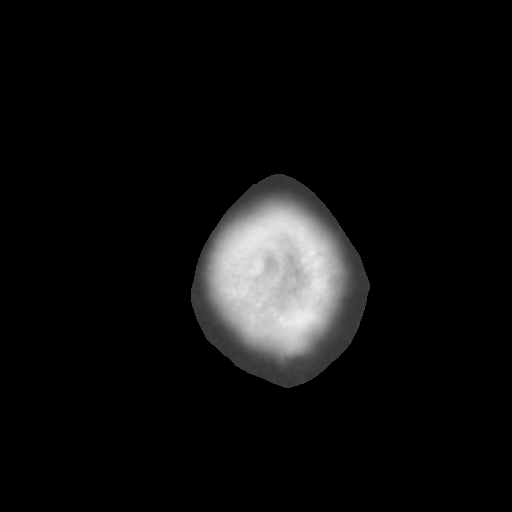

[Series 4: coronal soft tissue · coronal · 0.32mm/px · 3 of 67 slices shown]
[im 23/67  brain]
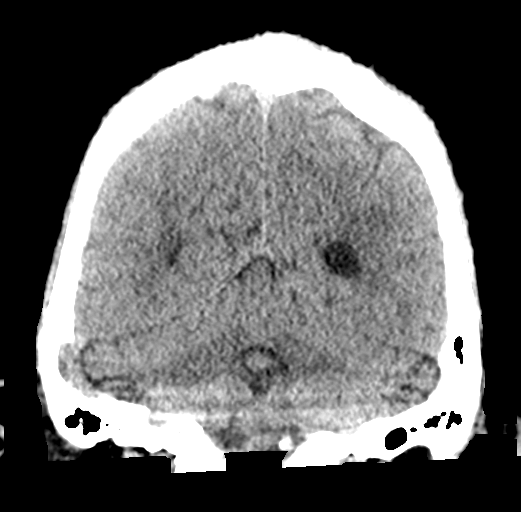
[im 30/67  brain]
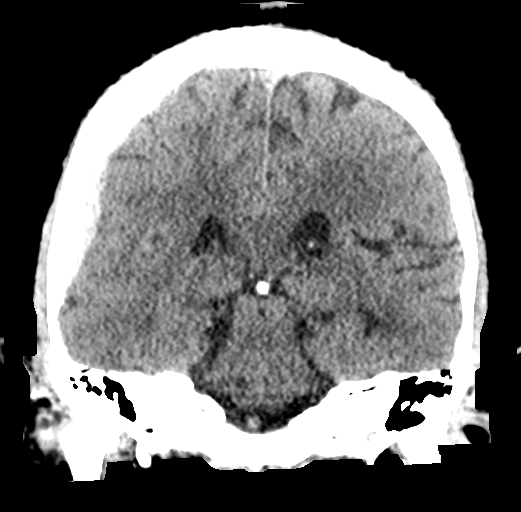
[im 37/67  brain]
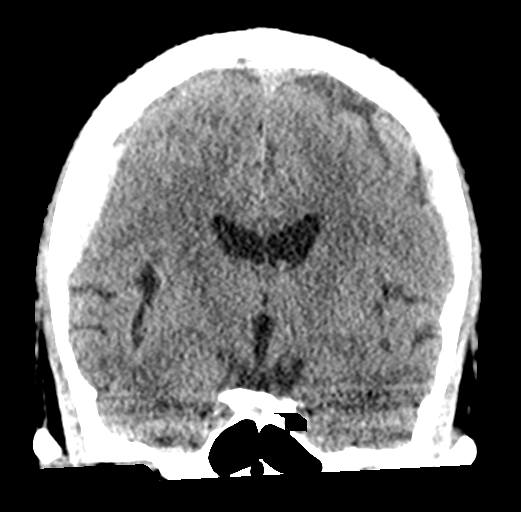

[Series 5: sagittal soft tissue · sagittal · 0.32mm/px · 3 of 52 slices shown]
[im 18/52  brain]
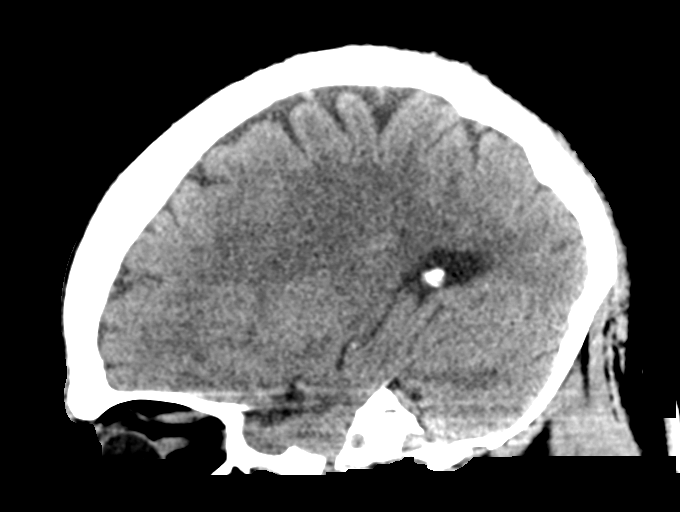
[im 26/52  brain]
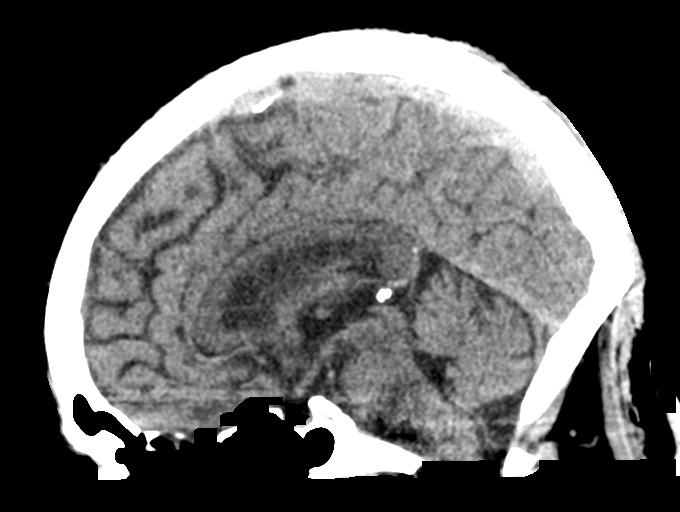
[im 35/52  brain]
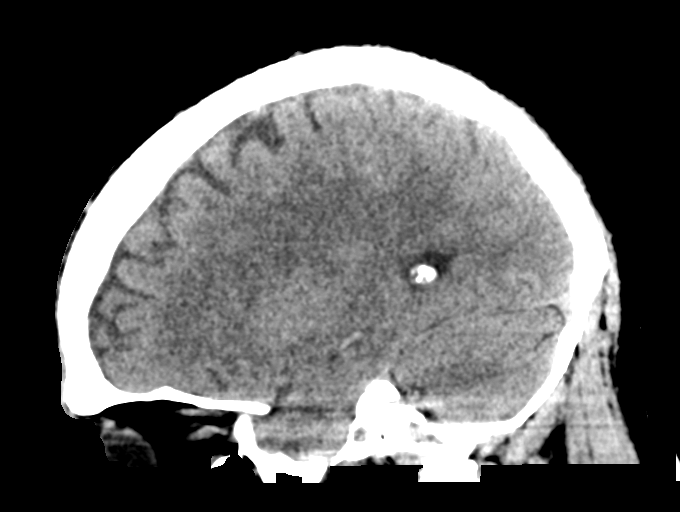

[15 of 47 positions shown; findings below may reference images not displayed]

FINDINGS: Brain: Acute extra-axial hemorrhage in the right temporal region as
a lentiform appearance concerning for right temporal epidural
hematoma. This measures up to 0.9 cm in thickness, and extends
proximally 5 cm in anterior-posterior dimension and 6 cm in
craniocaudal extent. Minimal underlying sulcal effacement without
midline shift.

There is a small contralateral left temporal subdural hematoma
measuring up to 0.4 cm without mass effect.

No signs of acute infarct. The lateral ventricles and midline
structures are unremarkable.

Vascular: No hyperdense vessel or unexpected calcification.

Skull: Normal. Negative for fracture or focal lesion.

Sinuses/Orbits: No acute finding.

Other: None.
IMPRESSION: 1. Acute right temporal extra-axial hematoma suspicious for epidural
hematoma. This measures up to 0.9 cm in maximal thickness with mild
underlying sulcal effacement. No midline shift.
2. Small contralateral acute left temporal subdural hematoma
measuring up to 0.4 cm. No mass effect.
3. No evidence of acute infarct.

Critical Value/emergent results were called by telephone at the time
of interpretation on [DATE] at [DATE] to provider BENNET ,
who verbally acknowledged these results.

## 2021-03-24 MED ORDER — DEXTROSE 50 % IV SOLN: 0.0000 mL | INTRAVENOUS | Status: DC | PRN

## 2021-03-24 MED ORDER — POLYETHYLENE GLYCOL 3350 17 G PO PACK: 17.0000 g | PACK | Freq: Every day | ORAL | Status: DC | PRN

## 2021-03-24 MED ORDER — POTASSIUM CHLORIDE CRYS ER 20 MEQ PO TBCR
40.0000 meq | EXTENDED_RELEASE_TABLET | Freq: Once | ORAL | Status: AC
  Administered 2021-03-24: 40 meq via ORAL
  Filled 2021-03-24: qty 2

## 2021-03-24 MED ORDER — DOCUSATE SODIUM 100 MG PO CAPS: 100.0000 mg | ORAL_CAPSULE | Freq: Two times a day (BID) | ORAL | Status: DC | PRN

## 2021-03-24 MED ORDER — LABETALOL HCL 5 MG/ML IV SOLN: 10.0000 mg | Freq: Once | INTRAVENOUS | Status: DC

## 2021-03-24 MED ORDER — LACTATED RINGERS IV BOLUS
1000.0000 mL | Freq: Once | INTRAVENOUS | Status: AC
  Administered 2021-03-24: 1000 mL via INTRAVENOUS

## 2021-03-24 MED ORDER — LABETALOL HCL 5 MG/ML IV SOLN
10.0000 mg | Freq: Once | INTRAVENOUS | Status: AC
  Administered 2021-03-24: 10 mg via INTRAVENOUS
  Filled 2021-03-24: qty 4

## 2021-03-24 MED ORDER — POTASSIUM CHLORIDE 10 MEQ/100ML IV SOLN
10.0000 meq | INTRAVENOUS | Status: AC
Start: 2021-03-24 — End: 2021-03-25
  Administered 2021-03-24 – 2021-03-25 (×4): 10 meq via INTRAVENOUS
  Filled 2021-03-24 (×4): qty 100

## 2021-03-24 MED ORDER — LEVETIRACETAM IN NACL 1500 MG/100ML IV SOLN
1500.0000 mg | Freq: Once | INTRAVENOUS | Status: AC
  Administered 2021-03-24: 1500 mg via INTRAVENOUS
  Filled 2021-03-24: qty 100

## 2021-03-24 MED ORDER — LABETALOL HCL 5 MG/ML IV SOLN: 20.0000 mg | Freq: Once | INTRAVENOUS | Status: DC

## 2021-03-24 MED ORDER — DEXTROSE IN LACTATED RINGERS 5 % IV SOLN: INTRAVENOUS | Status: DC

## 2021-03-24 MED ORDER — LEVETIRACETAM IN NACL 1000 MG/100ML IV SOLN
1000.0000 mg | Freq: Two times a day (BID) | INTRAVENOUS | Status: DC
  Administered 2021-03-25: 1000 mg via INTRAVENOUS
  Filled 2021-03-24 (×3): qty 100

## 2021-03-24 MED ORDER — NICARDIPINE HCL IN NACL 20-0.86 MG/200ML-% IV SOLN
3.0000 mg/h | INTRAVENOUS | Status: DC
  Administered 2021-03-24: 5 mg/h via INTRAVENOUS
  Filled 2021-03-24: qty 200

## 2021-03-24 MED ORDER — PANTOPRAZOLE SODIUM 40 MG IV SOLR
40.0000 mg | Freq: Every day | INTRAVENOUS | Status: DC
  Administered 2021-03-24: 40 mg via INTRAVENOUS
  Filled 2021-03-24: qty 40

## 2021-03-24 MED ORDER — MAGNESIUM SULFATE 2 GM/50ML IV SOLN
2.0000 g | Freq: Once | INTRAVENOUS | Status: AC
  Administered 2021-03-24: 2 g via INTRAVENOUS
  Filled 2021-03-24: qty 50

## 2021-03-24 MED ORDER — INSULIN REGULAR(HUMAN) IN NACL 100-0.9 UT/100ML-% IV SOLN
INTRAVENOUS | Status: DC
  Administered 2021-03-24: 11.5 [IU]/h via INTRAVENOUS
  Filled 2021-03-24: qty 100

## 2021-03-24 MED ORDER — LACTATED RINGERS IV SOLN: INTRAVENOUS | Status: DC

## 2021-03-24 NOTE — ED Notes (Signed)
Patient transported to CT 

## 2021-03-24 NOTE — ED Triage Notes (Signed)
Pt here via [facility] with a seizure from home. Pt does not have a hx of a seizure and states that he feels fine but does not remember what happened. Pt in NAD in triage.

## 2021-03-24 NOTE — ED Triage Notes (Signed)
First Nurse Note:  Arrives via [facility]. C?O seizure today.  CBG:  'High".  Hx HTN, DM, non compliant with medications.    192/108 97 18  18g Left hand.  500 cc NS IVB.

## 2021-03-24 NOTE — H&P (Addendum)
NAME:  Chase Rodriguez, MRN:  [id number], DOB:  09-13-1960, LOS: 1 ADMISSION DATE:  03/24/2021, CONSULTATION DATE:  03/24/2021 REFERRING MD:  Dr. Charlsie Quest, CHIEF COMPLAINT:  Seizures   History of Present Illness:  60 year old male presenting to [facility] ED from home via EMS on 03/24/2021 for evaluation of a possible seizure.  Per ED documentation & with the assistance of girlfriend's report bedside, the patient was seated on the couch eating some food felt fine and then woke up in ambulance.  Girlfriend reported he seemed okay until he suddenly slumped over and syncopized grip jerking and shaking his right arm only, with correlating incontinence of urine.  He fell to the ground striking the right side of his head on the carpeted floor.  She reported possibly as much as 30 minutes of this right arm seizure activity & unresponsive state prior to self resolving.  He was slow to recover and seemed confused not saying anything until she met back up with him at the hospital. ED course: Patient has been hypertensive since arrival, with hyperglycemia.  CT head as below shows small epidural and subdural hematomas.  Dr. Charlsie Quest spoke with neurosurgeon on-call: Dr. Dava Najjar who reviewed the images from home.  Dr. Dava Najjar recommended maintaining SBP <160, Q 1 h neurochecks, repeat CT head in 3 hours and admission to ICU. medications given: LR bolus 1L, labetalol 10 mg followed by Nicardipine drip, Keppra load 1500 mg, Mag 2g IVP, K+ 40 meq PO followed by 40 meq IVP Initial Vitals: 98.2, 18, HR 100, 175/114 & SpO2 99% on RA Significant labs: (Labs/ Imaging personally reviewed) I, Domingo Pulse [place], AGACNP-BC, personally viewed and interpreted this ECG. EKG Interpretation: Date: 03/24/21, EKG Time: 14:04, Rate: 100, Rhythm: ST,  QRS Axis:  normal, Intervals: prolonged QTc, ST/T Wave abnormalities: nonspecific T wave abnormalities, Narrative Interpretation: ST with LVH, prolonged QTc & non specific T wave  abnormalities Chemistry: Na+: 127, K+: 2.3, Cl: 88, BUN/Cr.:  23/2.48, Serum CO2/ AG: 28/11,  Mg: 1.4, Glucose: 768 Hematology: WBC: 8.7, Hgb: 15.1,  Troponin: 88,  COVID-19 & Influenza A/B: negative VBG: 7.40/ 53/33/32.8  CT head without contrast 03/24/2021: Acute right temporal extra-axial hematoma suspicious for epidural hematoma, measuring 0.9 cm with mild underlying sulcal effacement, no midline shift.  Small contralateral acute left temporal subdural hematoma measuring up to 0.4 cm, no mass-effect.  No evidence of acute infarct  PCCM consulted for admission due to the need for every hour neurochecks and nicardipine drip to maintain blood pressure parameters. Pertinent  Medical History  HTN HLD T2DM Hepatitis  Significant Hospital Events: Including procedures, antibiotic start and stop dates in addition to other pertinent events   03/24/2021-patient admitted to ICU requiring nicardipine drip for blood pressure control in the setting of suspected new onset seizure activity & small epidural and subdural hematomas.  Interim History / Subjective:  Patient alert and responsive but drowsy, stating he is "tired and wants to sleep". Neuro exam WNL. Patient denies any regular alcohol use, denies recreational drug use Plan of care discussed no questions at this time.  Objective   Blood pressure 132/73, pulse 86, temperature 98.3 F (36.8 C), temperature source Oral, resp. rate 17, height '5\' 11"'  (1.803 m), weight 99.2 kg, SpO2 96 %.        Intake/Output Summary (Last 24 hours) at 03/25/2021 0015 Last data filed at 03/24/2021 2350 Gross per 24 hour  Intake 1350 ml  Output 270 ml  Net 1080 ml   Danley Danker  Weights   03/24/21 1403 03/24/21 2244  Weight: 89.8 kg 99.2 kg    Examination: General: Adult male, acutely ill, lying in bed, NAD HEENT: MM pink/moist, anicteric, atraumatic, neck supple Neuro: A&O x 4, able to follow commands, PERRL +3, MAE: 5/5 BUE & 4/5 BLE CV: s1s2 RRR, NSR  on monitor, no r/m/g Pulm: Regular, non labored on room air, breath sounds clear-BUL & diminished-BLL GI: soft, rounded, non tender, bs x 4 Skin: Limited exam- no rashes/lesions noted Extremities: warm/dry, pulses + 2 R/P, no edema noted  Resolved Hospital Problem list     Assessment & Plan:  Acute Right Temporal Epidural hematoma & small contralateral acute left temporal subdural hematoma secondary to syncope and fall in the setting of new onset seizure activity  Neurosurgeon on call, Dr. Dava Najjar recommended: - Q 1 hour neuro checks - repeat CT head in 3 hours > timed for 00:00 - maintain SBP < 160 - continue nicardipine drip - any worsening in mentation > neurosurgery will be alerted to change for possible intervention - f/u CXR as patient syncopized with seizure activity while eating> daily CBC, monitor WBC/fever curve for possible aspiration pneumonia. No need for antibiotic coverage currently  Hypertensive Urgency PMHx: HTN Per patient report he has been taking his antihypertensives daily and did not miss a dose today. - continue Nicardipine drip as above - goal SBP < 160, goal to reduce BP by 25% in the first hour - f/u UDS - continuous cardiac monitoring - consider restarting outpatient lisinopril & carvedilol as patient stabilizes  Suspected New Onset Seizure activity Patient loaded with 1500 mg of Keppra in ED - continue 1000 mg Keppra BID - Ativan PRN for seizure activity - EEG in AM - Frequent neuro checks, as above - seizure & fall precautions - Neurology consulted, appreciate input  Hyperglycemia secondary to HHS vs uncontrolled Type 2 Diabetes Mellitus Glucose: 768, CO2: 28, AG: 11, Beta-hydrox: 0.12, serum osmolality: 306 According to patient he is currently taking metformin, unclear per med reconcile if he has taken any diabetes medication in the past 6 months Hemoglobin A1C: pending - Insulin drip initiated per hyperglycemia protocol - Monitor CBG Q 1,  per Endo tool until able to transition - LR @ 125 mL/h  - target range while in ICU: 140-180 - follow ICU hyper/hypo-glycemia protocol - consult Diabetes Coordinator  Acute Kidney Injury 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 Pseudohyponatremia in the setting of hyperglycemia-127 Hypokalemia-2.3 Hypomagnesemia-1.4 Patient received potassium and magnesium supplementation in ED Baseline Cr: 1.95 (per doc from 2021), Cr on admission: 2.48 - Strict I/O's: alert provider if UOP < 0.5 mL/kg/hr - gentle IVF hydration  - Daily BMP, replace electrolytes PRN - Avoid nephrotoxic agents as able, ensure adequate renal perfusion - consider renal US, if kidney function does not improve  Hyperlipidemia - continue outpatient Lipitor daily - f/u lipid panel  Tobacco use 20 year pack history - smoking cessation counseling provided - Once BP has stabilized can consider nicotine patch  Best Practice (right click and "Reselect all SmartList Selections" daily)  Diet/type: NPO w/ oral meds DVT prophylaxis: SCD GI prophylaxis: PPI Lines: N/A Foley:  N/A Code Status:  full code Last date of multidisciplinary goals of care discussion [03/24/21]  Labs   CBC: Recent Labs  Lab 03/24/21 1405  WBC 8.7  HGB 15.1  HCT 42.0  MCV 81.1  PLT 680    Basic Metabolic Panel: Recent Labs  Lab 03/24/21 1405  NA 127*  K 2.3*  CL 88*  CO2 28  GLUCOSE 768*  BUN 23*  CREATININE 2.48*  CALCIUM 8.3*  MG 1.4*   GFR: Estimated Creatinine Clearance: 39 mL/min (A) (by C-G formula based on SCr of 2.48 mg/dL (H)). Recent Labs  Lab 03/24/21 1405  WBC 8.7    Liver Function Tests: Recent Labs  Lab 03/24/21 2105  AST 21  ALT 12  ALKPHOS 65  BILITOT 0.5  PROT 7.4  ALBUMIN 3.1*   No results for input(s): LIPASE, AMYLASE in the last 168 hours. No results for input(s): AMMONIA in the last 168 hours.  ABG    Component Value Date/Time   HCO3 32.8 (H) 03/24/2021 2053   O2SAT 63.6 03/24/2021 2053      Coagulation Profile: Recent Labs  Lab 03/24/21 2105  INR 1.0    Cardiac Enzymes: No results for input(s): CKTOTAL, CKMB, CKMBINDEX, TROPONINI in the last 168 hours.  HbA1C: No results found for: HGBA1C  CBG: Recent Labs  Lab 03/24/21 2252 03/24/21 2355  GLUCAP 449* 444*    Review of Systems: Positives in BOLD  Gen: Denies fever, chills, weight change, fatigue, night sweats HEENT: Denies blurred vision, double vision, hearing loss, tinnitus, sinus congestion, rhinorrhea, sore throat, neck stiffness, dysphagia PULM: Denies shortness of breath, cough, sputum production, hemoptysis, wheezing CV: Denies chest pain, edema, orthopnea, paroxysmal nocturnal dyspnea, palpitations GI: Denies abdominal pain, nausea, vomiting, diarrhea, hematochezia, melena, constipation, change in bowel habits GU: Denies dysuria, hematuria, polyuria, oliguria, urethral discharge Endocrine: Denies hot or cold intolerance, polyuria, polyphagia or appetite change Derm: Denies rash, dry skin, scaling or peeling skin change Heme: Denies easy bruising, bleeding, bleeding gums Neuro: Denies headache, numbness, weakness, slurred speech, loss of memory or consciousness  Past Medical History:  He,  has a past medical history of Diabetes mellitus without complication ([place]), Hepatitis, High cholesterol, Hypertension, and Stage 3b chronic kidney disease (CKD) ([place]).   Surgical History:   Past Surgical History:  Procedure Laterality Date   TONSILLECTOMY       Social History:   reports that he has been smoking cigarettes. He has a 19.50 pack-year smoking history. He has never used smokeless tobacco. He reports current alcohol use. He reports that he does not use drugs.   Family History:  His family history is not on file.   Allergies No Known Allergies   Home Medications  Prior to Admission medications   Medication Sig Start Date End Date Taking? Authorizing Provider  atorvastatin (LIPITOR) 20 MG  tablet Take 20 mg by mouth daily.    [provider]  gabapentin (NEURONTIN) 300 MG capsule Take 1 capsule (300 mg total) by mouth 2 (two) times daily. 08/19/17 09/18/17  Fisher, Linden Dolin, PA-C  insulin detemir (LEVEMIR) 100 UNIT/ML injection Inject 50 Units into the skin at bedtime.    [provider]  lisinopril-hydrochlorothiazide (PRINZIDE,ZESTORETIC) 20-25 MG tablet Take 1 tablet by mouth daily.    [provider]  metFORMIN (GLUCOPHAGE) 1000 MG tablet Take 1,000 mg by mouth 2 (two) times daily with a meal.    [provider]     Critical care time: 62 minutes       Venetia Night, AGACNP-BC Acute Care Nurse Practitioner [facility]   [phone] / [phone] Please see Amion for pager details.

## 2021-03-24 NOTE — ED Provider Notes (Signed)
[facility] Emergency Department Provider Note ____________________________________________   Event Date/Time   First MD Initiated Contact with Patient 03/24/21 2048     (approximate)  I have reviewed the triage vital signs and the nursing notes.  HISTORY  Chief Complaint Seizures   HPI Chase Rodriguez is a 60 y.o. malewho presents to the ED for evaluation of possible seizure.  Chart review indicates HTN, HLD, DM and CKD.  No thinners, including no aspirin.  Patient presents to the ED for evaluation of syncope, possible seizure at home.  He presents with his girlfriend, and she provides majority of history.  Patient reports that he was seated on the couch eating some food, felt fine and then woke up in the ambulance.  Reports feeling fine now without pain and without recurrence of the symptoms.  Girlfriend reports that he seemed okay, until he suddenly slumped over and syncopized, jerking and shaking his right arm only and was incontinent of urine.  He fell to the ground, striking the right side of his head on the carpeted floor.  She reports 1-2 minutes of this right arm seizure activity prior to self resolving.  He was slow to recover and seemed confused, not saying anything until she met back up with him at the hospital.  Past Medical History:  Diagnosis Date   Diabetes mellitus without complication ([place])    Hepatitis    High cholesterol    Hypertension     Patient Active Problem List   Diagnosis Date Noted   Seizures ([place]) 03/24/2021    Past Surgical History:  Procedure Laterality Date   TONSILLECTOMY      Prior to Admission medications   Medication Sig Start Date End Date Taking? Authorizing Provider  atorvastatin (LIPITOR) 20 MG tablet Take 20 mg by mouth daily.    [provider]  gabapentin (NEURONTIN) 300 MG capsule Take 1 capsule (300 mg total) by mouth 2 (two) times daily. 08/19/17 09/18/17  Fisher, Linden Dolin, PA-C  insulin  detemir (LEVEMIR) 100 UNIT/ML injection Inject 50 Units into the skin at bedtime.    [provider]  lisinopril-hydrochlorothiazide (PRINZIDE,ZESTORETIC) 20-25 MG tablet Take 1 tablet by mouth daily.    [provider]  metFORMIN (GLUCOPHAGE) 1000 MG tablet Take 1,000 mg by mouth 2 (two) times daily with a meal.    [provider]    Allergies Patient has no known allergies.  No family history on file.  Social History Social History   Tobacco Use   Smoking status: Every Day   Smokeless tobacco: Never  Substance Use Topics   Alcohol use: No   Drug use: No    Review of Systems  Constitutional: No fever/chills Eyes: No visual changes. ENT: No sore throat. Cardiovascular: Denies chest pain. Respiratory: Denies shortness of breath. Gastrointestinal: No abdominal pain.  No nausea, no vomiting.  No diarrhea.  No constipation. Genitourinary: Negative for dysuria. Musculoskeletal: Negative for back pain. Skin: Negative for rash. Neurological: Negative for headaches, focal weakness or numbness.  Seizure and syncope  ____________________________________________   PHYSICAL EXAM:  VITAL SIGNS: Vitals:   03/24/21 2300 03/24/21 2305  BP: (!) 150/94 (!) 142/84  Pulse: 88 87  Resp:    Temp:    SpO2: 98% 98%      Constitutional: Alert and oriented. Well appearing and in no acute distress. Eyes: Conjunctivae are normal. PERRL. EOMI. Head: Atraumatic. Nose: No congestion/rhinnorhea. Mouth/Throat: Mucous membranes are moist.  Oropharynx non-erythematous. Neck: No stridor.  No cervical spine tenderness to palpation. Cardiovascular: Normal rate, regular rhythm. Grossly normal heart sounds.  Good peripheral circulation. Respiratory: Normal respiratory effort.  No retractions. Lungs CTAB. Gastrointestinal: Soft , nondistended, nontender to palpation. No CVA tenderness. Musculoskeletal: No lower extremity tenderness nor edema.  No joint effusions. No signs  of acute trauma. Neurologic:  Normal speech and language. No gross focal neurologic deficits are appreciated.  Cranial nerves II through XII intact 5/5 strength and sensation in all 4 extremities Skin:  Skin is warm, dry and intact. No rash noted. Psychiatric: Mood and affect are normal. Speech and behavior are normal.  ____________________________________________   LABS (all labs ordered are listed, but only abnormal results are displayed)  Labs Reviewed  BASIC METABOLIC PANEL - Abnormal; Notable for the following components:      Result Value   Sodium 127 (*)    Potassium 2.3 (*)    Chloride 88 (*)    Glucose, Bld 768 (*)    BUN 23 (*)    Creatinine, Ser 2.48 (*)    Calcium 8.3 (*)    GFR, Estimated 29 (*)    All other components within normal limits  MAGNESIUM - Abnormal; Notable for the following components:   Magnesium 1.4 (*)    All other components within normal limits  OSMOLALITY - Abnormal; Notable for the following components:   Osmolality 306 (*)    All other components within normal limits  BLOOD GAS, VENOUS - Abnormal; Notable for the following components:   Bicarbonate 32.8 (*)    Acid-Base Excess 6.4 (*)    All other components within normal limits  URINALYSIS, COMPLETE (UACMP) WITH MICROSCOPIC - Abnormal; Notable for the following components:   Color, Urine STRAW (*)    APPearance CLEAR (*)    Glucose, UA >=500 (*)    Hgb urine dipstick MODERATE (*)    Protein, ur >=300 (*)    All other components within normal limits  HEPATIC FUNCTION PANEL - Abnormal; Notable for the following components:   Albumin 3.1 (*)    All other components within normal limits  APTT - Abnormal; Notable for the following components:   aPTT 37 (*)    All other components within normal limits  CBG MONITORING, ED - Abnormal; Notable for the following components:   Glucose-Capillary 449 (*)    All other components within normal limits  TROPONIN I (HIGH SENSITIVITY) - Abnormal;  Notable for the following components:   Troponin I (High Sensitivity) 88 (*)    All other components within normal limits  RESP PANEL BY RT-PCR (FLU A&B, COVID) ARPGX2  CBC  BETA-HYDROXYBUTYRIC ACID  PROTIME-INR  HIV ANTIBODY (ROUTINE TESTING W REFLEX)  CBC  MAGNESIUM  PHOSPHORUS  BASIC METABOLIC PANEL  HEMOGLOBIN A1C   ____________________________________________  12 Lead EKG  Sinus tachycardia with rate of 100 bpm.  Normal axis.  Prolonged QTC at 495 ms.  Otherwise normal intervals.  Stigmata of LVH.  Inferior and lateral T wave inversions without STEMI. ____________________________________________  RADIOLOGY  ED MD interpretation: CT head reviewed by me with left epidural bleed without midline shift.  Official radiology report(s): CT HEAD WO CONTRAST (5MM)  Result Date: 03/24/2021 CLINICAL DATA:  Seizure, amnesia EXAM: CT HEAD WITHOUT CONTRAST TECHNIQUE: Contiguous axial images were obtained from the base of the skull through the vertex without intravenous contrast. COMPARISON:  None. FINDINGS: Brain: Acute extra-axial hemorrhage in the right temporal region as a lentiform appearance concerning for right temporal epidural hematoma. This measures  up to 0.9 cm in thickness, and extends proximally 5 cm in anterior-posterior dimension and 6 cm in craniocaudal extent. Minimal underlying sulcal effacement without midline shift. There is a small contralateral left temporal subdural hematoma measuring up to 0.4 cm without mass effect. No signs of acute infarct. The lateral ventricles and midline structures are unremarkable. Vascular: No hyperdense vessel or unexpected calcification. Skull: Normal. Negative for fracture or focal lesion. Sinuses/Orbits: No acute finding. Other: None. IMPRESSION: 1. Acute right temporal extra-axial hematoma suspicious for epidural hematoma. This measures up to 0.9 cm in maximal thickness with mild underlying sulcal effacement. No midline shift. 2. Small  contralateral acute left temporal subdural hematoma measuring up to 0.4 cm. No mass effect. 3. No evidence of acute infarct. Critical Value/emergent results were called by telephone at the time of interpretation on 03/24/2021 at 9:19 pm to provider [facility] , who verbally acknowledged these results. Electronically Signed   By: Randa Ngo M.D.   On: 03/24/2021 21:25    ____________________________________________   PROCEDURES and INTERVENTIONS  Procedure(s) performed (including Critical Care):  .1-3 Lead EKG Interpretation Performed by: Vladimir Crofts, MD Authorized by: Vladimir Crofts, MD     Interpretation: normal     ECG rate:  98   ECG rate assessment: normal     Rhythm: sinus rhythm     Ectopy: none     Conduction: normal   .Critical Care Performed by: Vladimir Crofts, MD Authorized by: Vladimir Crofts, MD   Critical care provider statement:    Critical care time (minutes):  30   Critical care time was exclusive of:  Separately billable procedures and treating other patients   Critical care was necessary to treat or prevent imminent or life-threatening deterioration of the following conditions:  CNS failure or compromise   Critical care was time spent personally by me on the following activities:  Ordering and performing treatments and interventions, ordering and review of laboratory studies, ordering and review of radiographic studies, pulse oximetry, re-evaluation of patient's condition, review of old charts, discussions with consultants, evaluation of patient's response to treatment and examination of patient Ultrasound ED Peripheral IV (Provider)  Date/Time: 03/24/2021 11:26 PM Performed by: Vladimir Crofts, MD Authorized by: Vladimir Crofts, MD   Procedure details:    Indications: multiple failed IV attempts and poor IV access     Skin Prep: chlorhexidine gluconate     Location: Right basilic vein.   Angiocath:  18 G   Bedside Ultrasound Guided: Yes     Images: not archived      Patient tolerated procedure without complications: Yes     Dressing applied: Yes    Medications  potassium chloride 10 mEq in 100 mL IVPB (10 mEq Intravenous New Bag/Given 03/24/21 2223)  magnesium sulfate IVPB 2 g 50 mL (2 g Intravenous New Bag/Given 03/24/21 2239)  nicardipine (CARDENE) 58m in 0.86% saline 2039mIV infusion (0.1 mg/ml) (7.5 mg/hr Intravenous Rate/Dose Change 03/24/21 2256)  docusate sodium (COLACE) capsule 100 mg (has no administration in time range)  polyethylene glycol (MIRALAX / GLYCOLAX) packet 17 g (has no administration in time range)  pantoprazole (PROTONIX) injection 40 mg (40 mg Intravenous Given 03/24/21 2229)  levETIRAcetam (KEPPRA) IVPB 1000 mg/100 mL premix (has no administration in time range)  labetalol (NORMODYNE) injection 10 mg (has no administration in time range)  insulin regular, human (MYXREDLIN) 100 units/ 100 mL infusion (has no administration in time range)  lactated ringers infusion (has no administration in time range)  dextrose  5 % in lactated ringers infusion (has no administration in time range)  dextrose 50 % solution 0-50 mL (has no administration in time range)  lactated ringers bolus 1,000 mL (0 mLs Intravenous Stopped 03/24/21 2238)  potassium chloride SA (KLOR-CON) CR tablet 40 mEq (40 mEq Oral Given 03/24/21 2128)  levETIRAcetam (KEPPRA) IVPB 1500 mg/ 100 mL premix (0 mg Intravenous Stopped 03/24/21 2222)  labetalol (NORMODYNE) injection 10 mg (10 mg Intravenous Given 03/24/21 2200)    ____________________________________________   MDM / ED COURSE   60 year old male presents to the ED after syncopal episode and possible seizure activity, found to have intracranial hemorrhage requiring ICU admission, and concurrent HHS.  He presents quite well-appearing, GCS 15 and neurologically intact without evidence of trauma, neurovascular deficits.  Blood work with metabolic derangements concerning for HHS with his elevated osmolality without  acidosis to suggest DKA.  Hypokalemia and magnesium are repleted IV.  CT head with ICH and what looks like a countercoup SDH, likely traumatic after his fall.  I discussed with neurosurgery, who recommends nonoperative management at this time with ICU admission.  We start Cardene for BP control and admit to the ICU.  Clinical Course as of 03/24/21 0677  Fri Mar 24, 2021  2120 Call from rads. NSGY paged [DS]  2123 I speak with Dr. Catalina Pizza, he is logging on to review images. Expects obs with repeat ct. Recommends keppra.  [DS]  2136 Updated pt of this [DS]  2138 NSGY callback. ICU, Q1 neurochecks, repeat 3hr CT. SBP < 160 [DS]  2151 USIV placed by me.  [DS]    Clinical Course User Index [DS] Vladimir Crofts, MD    ____________________________________________   FINAL CLINICAL IMPRESSION(S) / ED DIAGNOSES  Final diagnoses:  Epidural hematoma  Seizure ([place])  Hyperosmolar hyperglycemic state (HHS) ([place])  Hypokalemia  Hypomagnesemia     ED Discharge Orders     None        Drucella Karbowski   Note:  This document was prepared using Dragon voice recognition software and may include unintentional dictation errors.    Vladimir Crofts, MD 03/24/21 [phone]

## 2021-03-25 ENCOUNTER — Inpatient Hospital Stay: Payer: Self-pay

## 2021-03-25 ENCOUNTER — Encounter: Payer: Self-pay | Admitting: Internal Medicine

## 2021-03-25 DIAGNOSIS — R569 Unspecified convulsions: Secondary | ICD-10-CM

## 2021-03-25 DIAGNOSIS — E1100 Type 2 diabetes mellitus with hyperosmolarity without nonketotic hyperglycemic-hyperosmolar coma (NKHHC): Secondary | ICD-10-CM

## 2021-03-25 DIAGNOSIS — E876 Hypokalemia: Secondary | ICD-10-CM

## 2021-03-25 DIAGNOSIS — S064XAA Epidural hemorrhage with loss of consciousness status unknown, initial encounter: Secondary | ICD-10-CM | POA: Diagnosis present

## 2021-03-25 LAB — LIPID PANEL
Cholesterol: 264 mg/dL — ABNORMAL HIGH (ref 0–200)
HDL: 29 mg/dL — ABNORMAL LOW (ref >40)
LDL Cholesterol: 170 mg/dL — ABNORMAL HIGH (ref 0–99)
Total CHOL/HDL Ratio: 9.1 RATIO
Triglycerides: 325 mg/dL — ABNORMAL HIGH (ref <150)
VLDL: 65 mg/dL — ABNORMAL HIGH (ref 0–40)

## 2021-03-25 LAB — URINE DRUG SCREEN, QUALITATIVE (ARMC ONLY)
Amphetamines, Ur Screen: NOT DETECTED
Barbiturates, Ur Screen: NOT DETECTED
Benzodiazepine, Ur Scrn: NOT DETECTED
Cannabinoid 50 Ng, Ur ~~LOC~~: NOT DETECTED
Cocaine Metabolite,Ur ~~LOC~~: NOT DETECTED
MDMA (Ecstasy)Ur Screen: NOT DETECTED
Methadone Scn, Ur: NOT DETECTED
Opiate, Ur Screen: NOT DETECTED
Phencyclidine (PCP) Ur S: NOT DETECTED
Tricyclic, Ur Screen: NOT DETECTED

## 2021-03-25 LAB — BASIC METABOLIC PANEL
Anion gap: 9 (ref 5–15)
Anion gap: 10 (ref 5–15)
Anion gap: 9 (ref 5–15)
Anion gap: 10 (ref 5–15)
BUN: 19 mg/dL (ref 6–20)
BUN: 16 mg/dL (ref 6–20)
BUN: 17 mg/dL (ref 6–20)
BUN: 14 mg/dL (ref 6–20)
CO2: 28 mmol/L (ref 22–32)
CO2: 29 mmol/L (ref 22–32)
CO2: 32 mmol/L (ref 22–32)
CO2: 29 mmol/L (ref 22–32)
Calcium: 7.9 mg/dL — ABNORMAL LOW (ref 8.9–10.3)
Calcium: 7.6 mg/dL — ABNORMAL LOW (ref 8.9–10.3)
Calcium: 7.6 mg/dL — ABNORMAL LOW (ref 8.9–10.3)
Calcium: 7.3 mg/dL — ABNORMAL LOW (ref 8.9–10.3)
Chloride: 96 mmol/L — ABNORMAL LOW (ref 98–111)
Chloride: 96 mmol/L — ABNORMAL LOW (ref 98–111)
Chloride: 96 mmol/L — ABNORMAL LOW (ref 98–111)
Chloride: 98 mmol/L (ref 98–111)
Creatinine, Ser: 2.1 mg/dL — ABNORMAL HIGH (ref 0.61–1.24)
Creatinine, Ser: 2.03 mg/dL — ABNORMAL HIGH (ref 0.61–1.24)
Creatinine, Ser: 2.06 mg/dL — ABNORMAL HIGH (ref 0.61–1.24)
Creatinine, Ser: 2.02 mg/dL — ABNORMAL HIGH (ref 0.61–1.24)
GFR, Estimated: 36 mL/min — ABNORMAL LOW (ref >60)
GFR, Estimated: 37 mL/min — ABNORMAL LOW (ref >60)
GFR, Estimated: 37 mL/min — ABNORMAL LOW (ref >60)
GFR, Estimated: 38 mL/min — ABNORMAL LOW (ref >60)
Glucose, Bld: 336 mg/dL — ABNORMAL HIGH (ref 70–99)
Glucose, Bld: 245 mg/dL — ABNORMAL HIGH (ref 70–99)
Glucose, Bld: 227 mg/dL — ABNORMAL HIGH (ref 70–99)
Glucose, Bld: 242 mg/dL — ABNORMAL HIGH (ref 70–99)
Potassium: <2 mmol/L — CL (ref 3.5–5.1)
Potassium: 2.2 mmol/L — CL (ref 3.5–5.1)
Potassium: 2 mmol/L — CL (ref 3.5–5.1)
Potassium: 2.5 mmol/L — CL (ref 3.5–5.1)
Sodium: 133 mmol/L — ABNORMAL LOW (ref 135–145)
Sodium: 135 mmol/L (ref 135–145)
Sodium: 137 mmol/L (ref 135–145)
Sodium: 137 mmol/L (ref 135–145)

## 2021-03-25 LAB — GLUCOSE, CAPILLARY
Glucose-Capillary: 326 mg/dL — ABNORMAL HIGH (ref 70–99)
Glucose-Capillary: 276 mg/dL — ABNORMAL HIGH (ref 70–99)
Glucose-Capillary: 208 mg/dL — ABNORMAL HIGH (ref 70–99)
Glucose-Capillary: 171 mg/dL — ABNORMAL HIGH (ref 70–99)
Glucose-Capillary: 181 mg/dL — ABNORMAL HIGH (ref 70–99)
Glucose-Capillary: 169 mg/dL — ABNORMAL HIGH (ref 70–99)
Glucose-Capillary: 164 mg/dL — ABNORMAL HIGH (ref 70–99)
Glucose-Capillary: 190 mg/dL — ABNORMAL HIGH (ref 70–99)
Glucose-Capillary: 279 mg/dL — ABNORMAL HIGH (ref 70–99)
Glucose-Capillary: 220 mg/dL — ABNORMAL HIGH (ref 70–99)
Glucose-Capillary: 224 mg/dL — ABNORMAL HIGH (ref 70–99)
Glucose-Capillary: 251 mg/dL — ABNORMAL HIGH (ref 70–99)
Glucose-Capillary: 314 mg/dL — ABNORMAL HIGH (ref 70–99)

## 2021-03-25 LAB — HEMOGLOBIN A1C
Hgb A1c MFr Bld: 12.5 % — ABNORMAL HIGH (ref 4.8–5.6)
Mean Plasma Glucose: 312.05 mg/dL

## 2021-03-25 LAB — PHOSPHORUS
Phosphorus: 2.1 mg/dL — ABNORMAL LOW (ref 2.5–4.6)
Phosphorus: 3.3 mg/dL (ref 2.5–4.6)

## 2021-03-25 LAB — CBC
HCT: 39.1 % (ref 39.0–52.0)
Hemoglobin: 14.2 g/dL (ref 13.0–17.0)
MCH: 29 pg (ref 26.0–34.0)
MCHC: 36.3 g/dL — ABNORMAL HIGH (ref 30.0–36.0)
MCV: 80 fL (ref 80.0–100.0)
Platelets: 240 10*3/uL (ref 150–400)
RBC: 4.89 MIL/uL (ref 4.22–5.81)
RDW: 13.1 % (ref 11.5–15.5)
WBC: 8.7 10*3/uL (ref 4.0–10.5)
nRBC: 0 % (ref 0.0–0.2)

## 2021-03-25 LAB — MRSA NEXT GEN BY PCR, NASAL: MRSA by PCR Next Gen: NOT DETECTED

## 2021-03-25 LAB — MAGNESIUM
Magnesium: 1.7 mg/dL (ref 1.7–2.4)
Magnesium: 1.5 mg/dL — ABNORMAL LOW (ref 1.7–2.4)
Magnesium: 1.6 mg/dL — ABNORMAL LOW (ref 1.7–2.4)

## 2021-03-25 LAB — NA AND K (SODIUM & POTASSIUM), RAND UR
Potassium Urine: 15 mmol/L
Sodium, Ur: 60 mmol/L

## 2021-03-25 LAB — HIV ANTIBODY (ROUTINE TESTING W REFLEX): HIV Screen 4th Generation wRfx: NONREACTIVE

## 2021-03-25 IMAGING — DX DG CHEST 1V PORT
1 series · 1 of 1 positions shown · non-contrast
Comparison: [DATE] at [AC] hours

CLINICAL DATA: Central line placement

EXAM:
PORTABLE CHEST 1 VIEW

[chest ap]
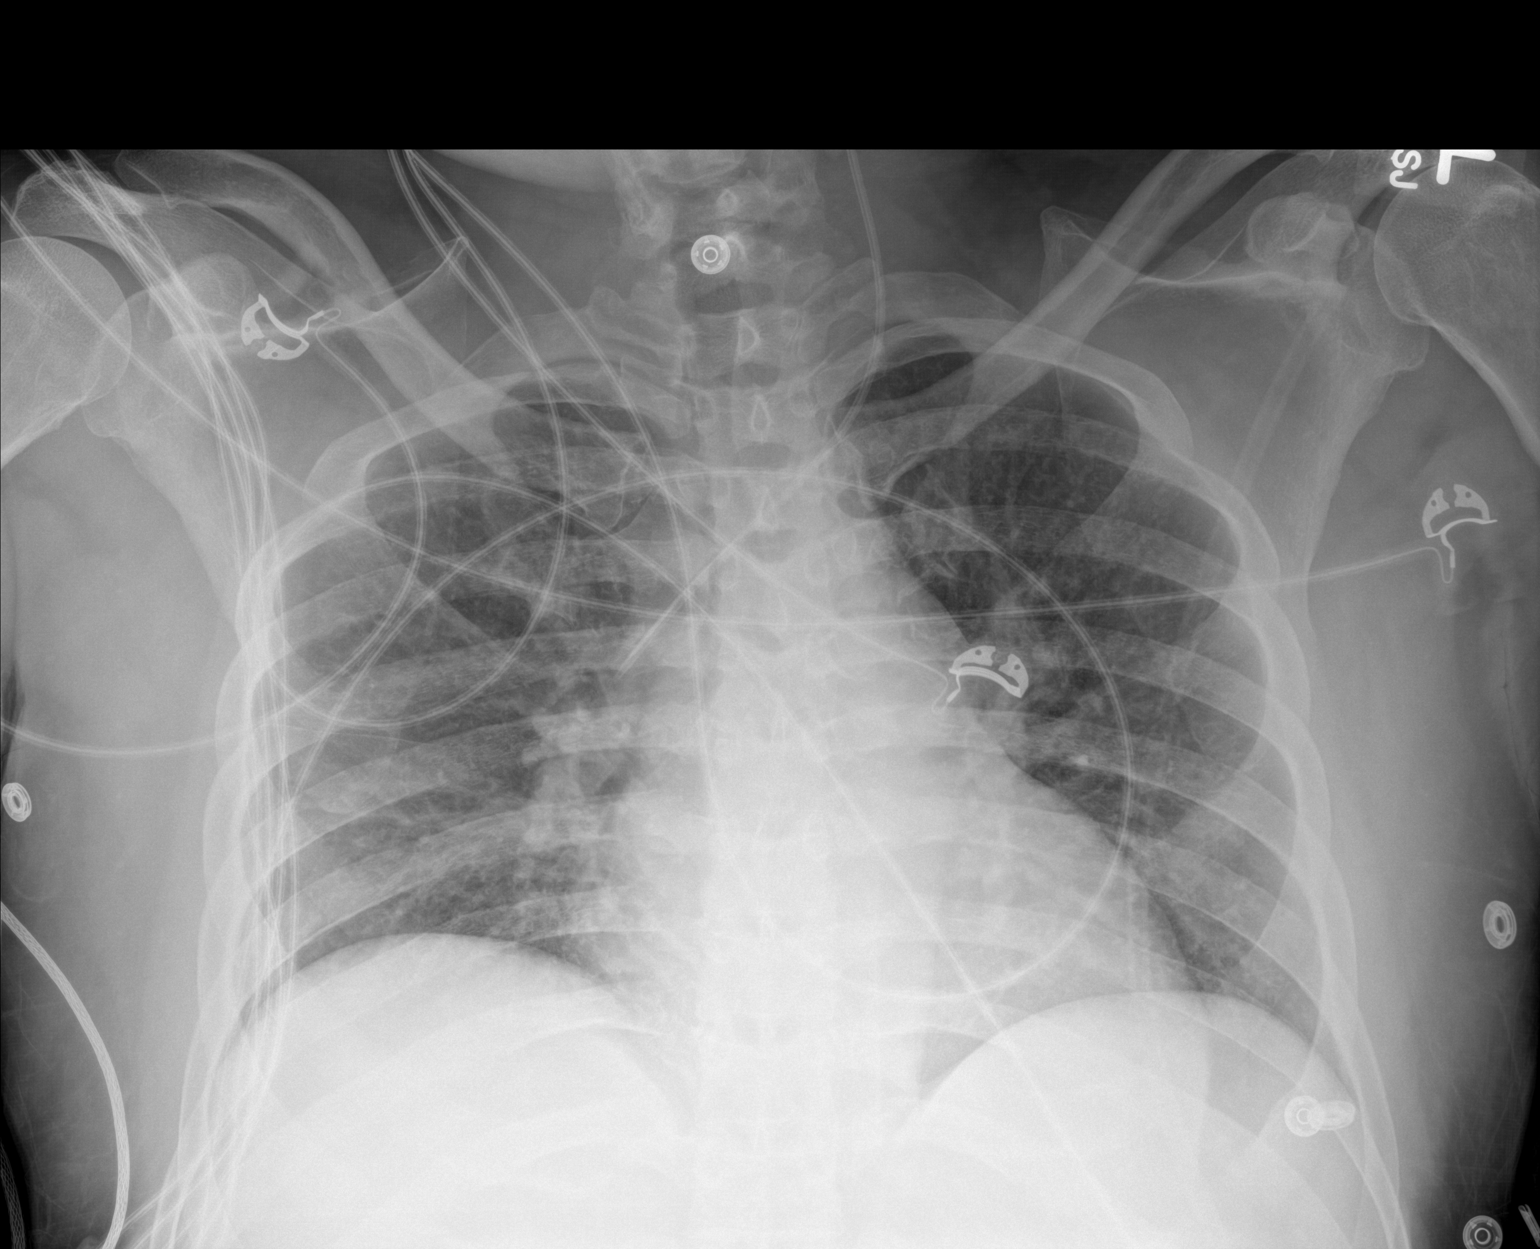

[1 of 1 positions shown; findings below may reference images not displayed]

FINDINGS: Low lung volumes. Lungs are clear. No pleural effusion or
pneumothorax.

The heart is normal in size.

Left IJ venous catheter terminates in the mid SVC.
IMPRESSION: Left IJ venous catheter terminates in the mid SVC.

## 2021-03-25 IMAGING — CR DG CHEST 1V PORT
1 series · 1 of 1 positions shown · non-contrast
Comparison: None.

CLINICAL DATA: Seizure

EXAM:
PORTABLE CHEST 1 VIEW

[dg chest port 1 view]
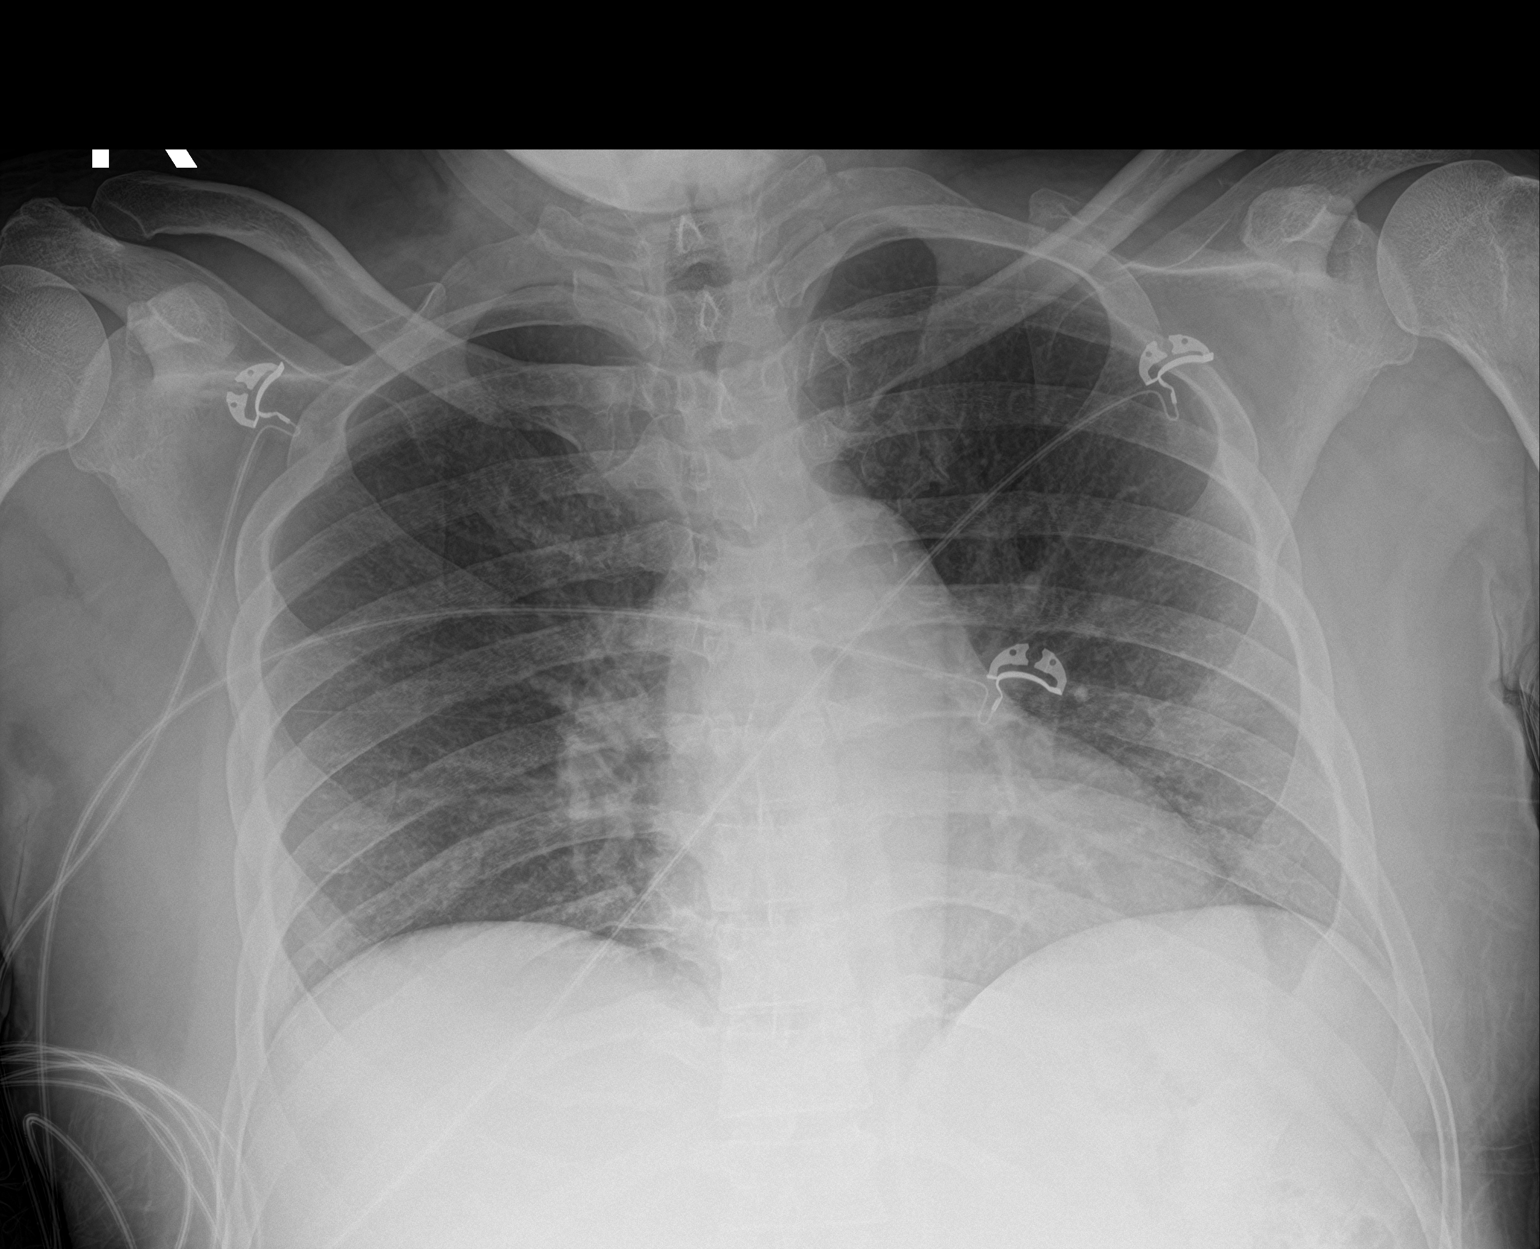

[1 of 1 positions shown; findings below may reference images not displayed]

FINDINGS: The heart size and mediastinal contours are within normal limits.
Both lungs are clear. The visualized skeletal structures are
unremarkable.
IMPRESSION: No active disease.

## 2021-03-25 IMAGING — CT CT HEAD W/O CM
1 of 2 series · 15 of 30 positions shown, 19 images · non-contrast
Comparison: [DATE] at [DATE] p.m.

CLINICAL DATA: Intracranial hemorrhage, follow-up examination

EXAM:
CT HEAD WITHOUT CONTRAST
TECHNIQUE: Contiguous axial images were obtained from the base of the skull
through the vertex without intravenous contrast.

[Series 2: head wo · axial · 0.46mm/px · z∈[+248,+393]mm · 15 of 33 slices shown, 19 images]
[im 2/33  brain]
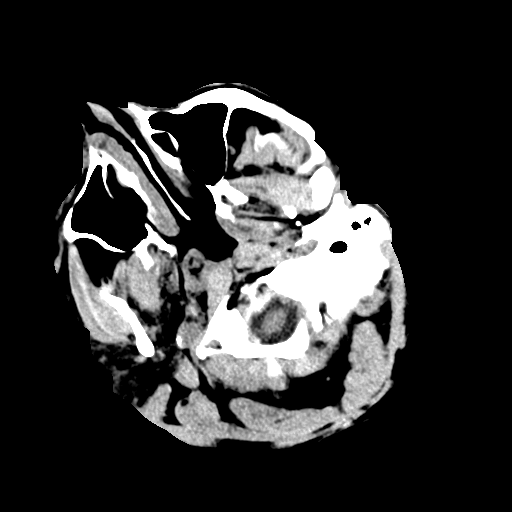
[im 2/33  bone]
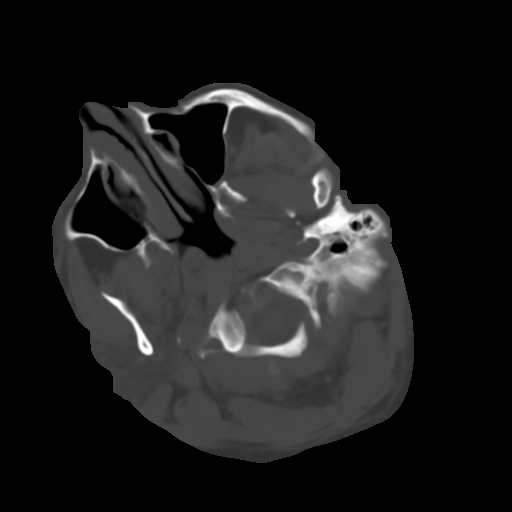
[im 4/33  brain]
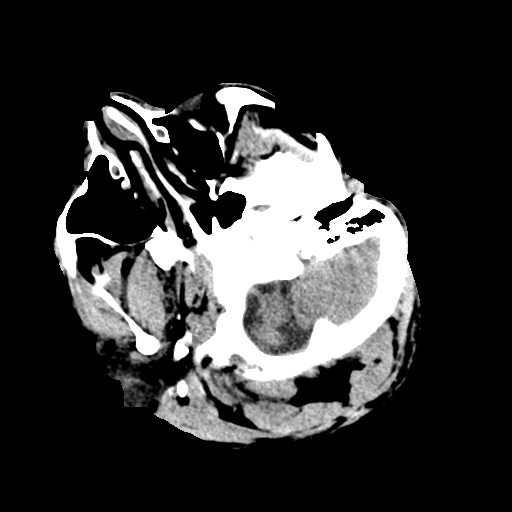
[im 7/33  brain]
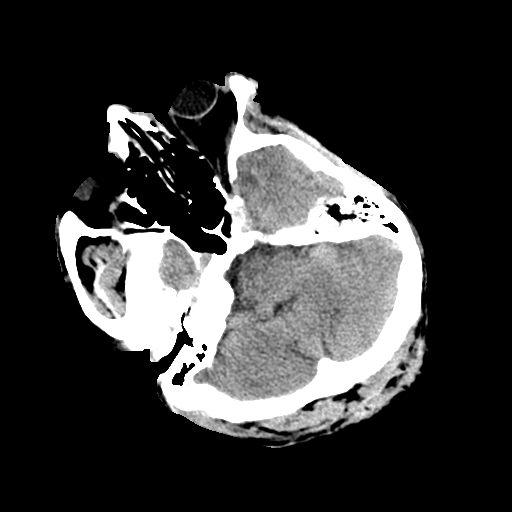
[im 9/33  brain]
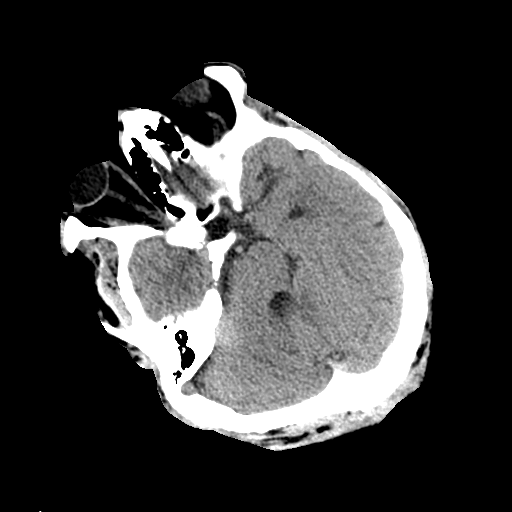
[im 10/33  brain]
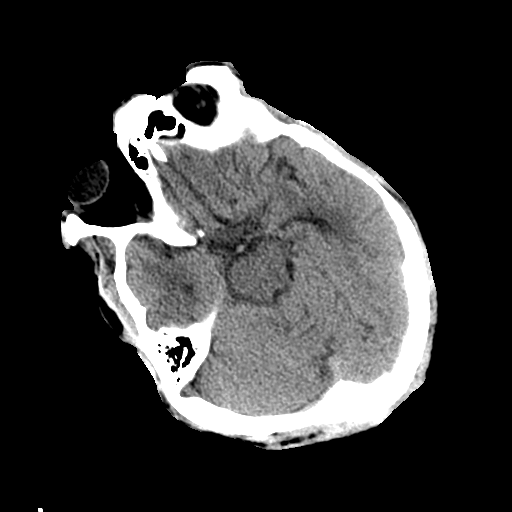
[im 10/33  bone]
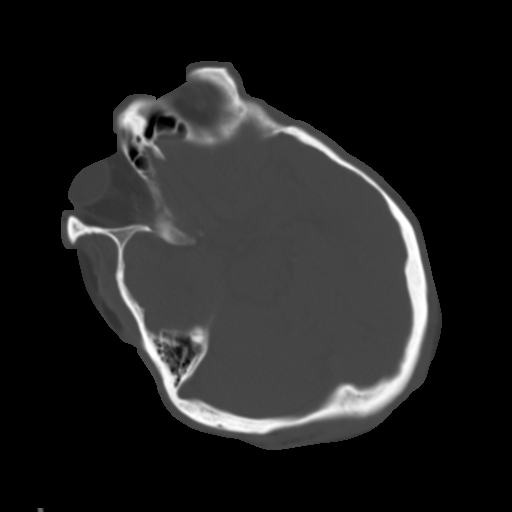
[im 12/33  brain]
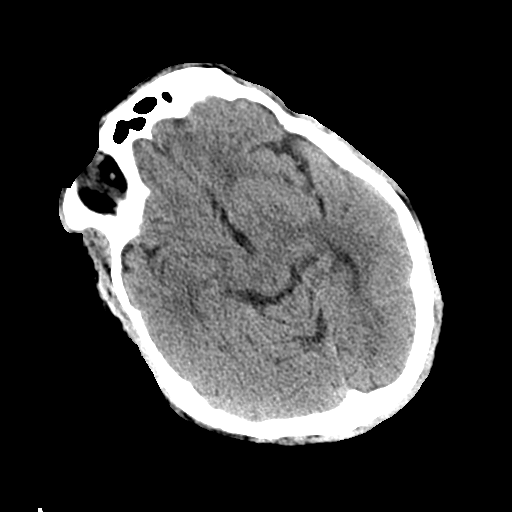
[im 15/33  brain]
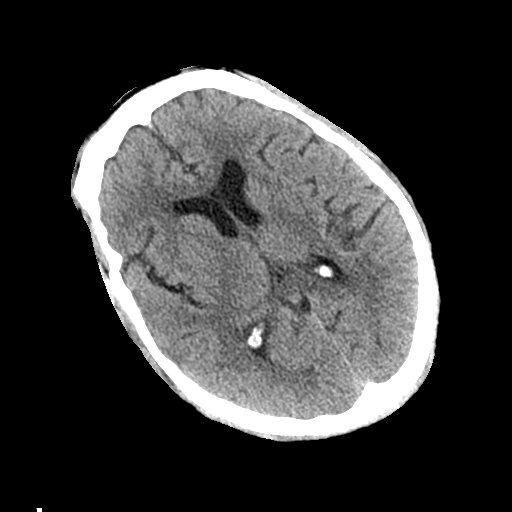
[im 17/33  brain]
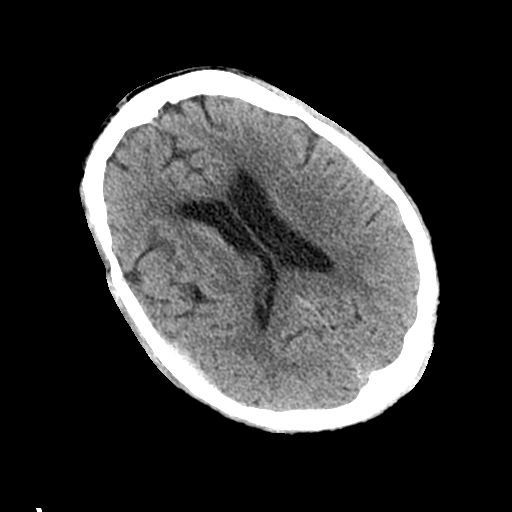
[im 18/33  brain]
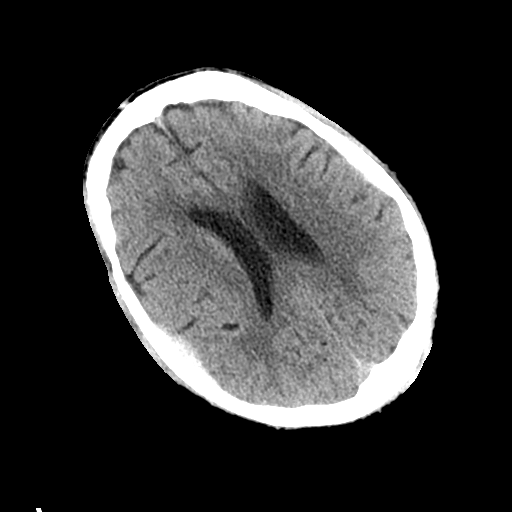
[im 18/33  bone]
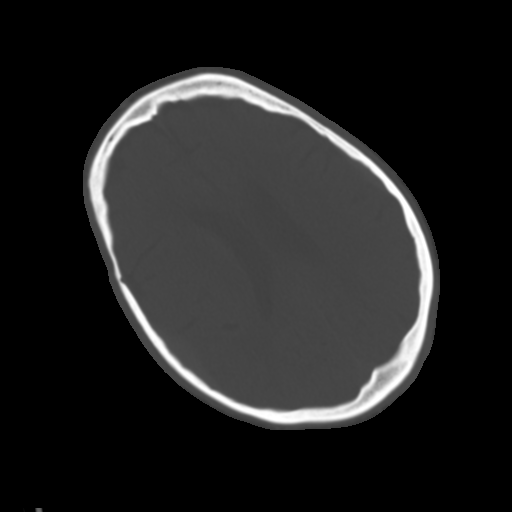
[im 21/33  brain]
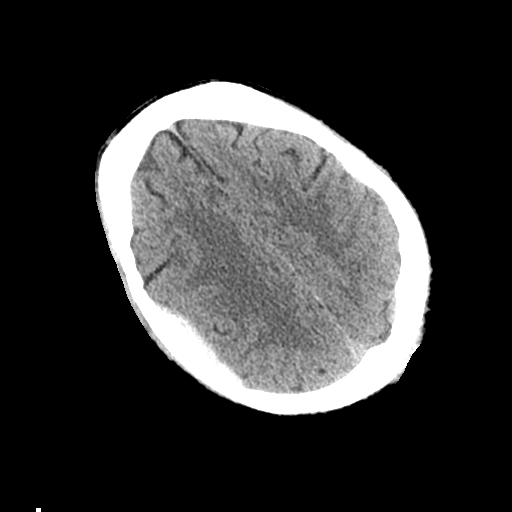
[im 23/33  brain]
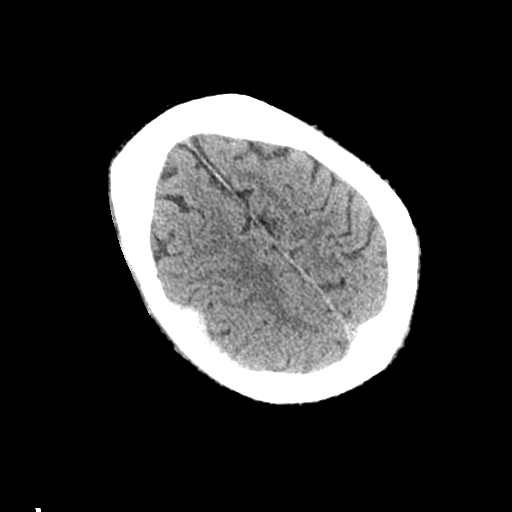
[im 25/33  brain]
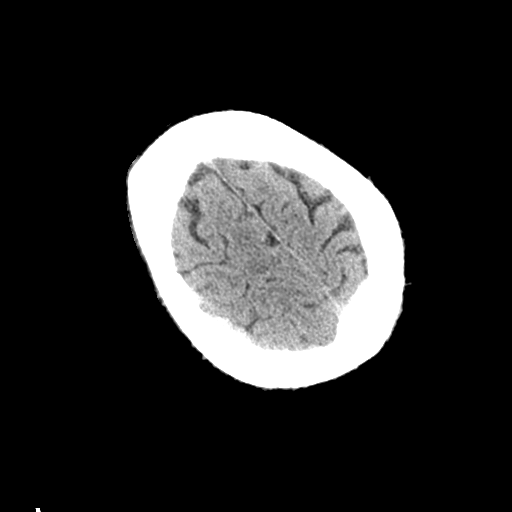
[im 26/33  brain]
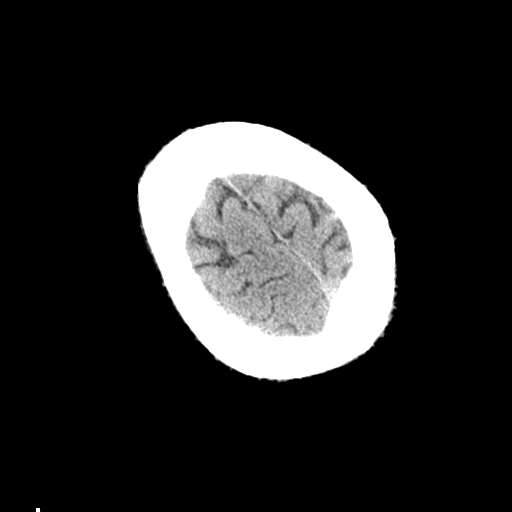
[im 26/33  bone]
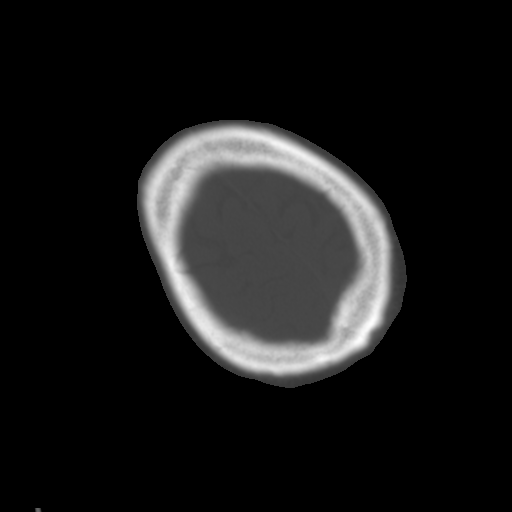
[im 29/33  brain]
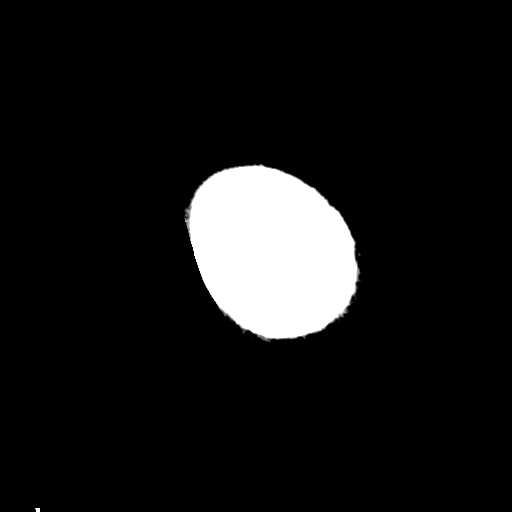
[im 31/33  brain]
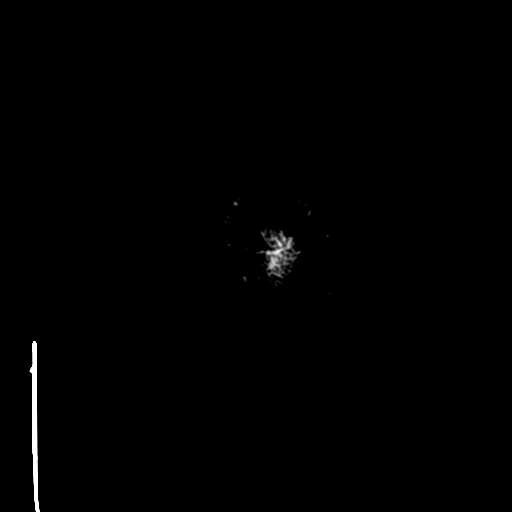

[15 of 30 positions shown; findings below may reference images not displayed]

FINDINGS: Brain: Extra-axial hemorrhagic fluid collection along the right
cerebral convexity demonstrating a slightly lenticular configuration
suggestive of a an epidural hematoma appears stable measuring 0.8 x
4.9 x 5.5 cm when measured in similar fashion. Mild associated mass
effect upon the right cerebral hemisphere is unchanged. Stable left
temporal extra-axial hemorrhagic fluid collection measuring 4 mm in
thickness. No significant associated mass effect with this
collection.

No interval hemorrhage. No significant midline shift. Ventricular
size is normal. No acute infarct. Remote lacunar infarct within the
right pons again noted. Cerebellum unremarkable.

Vascular: No hyperdense vessel or unexpected calcification.

Skull: Normal. Negative for fracture or focal lesion.

Sinuses/Orbits: No acute finding.

Other: Mastoid air cells and middle ear cavities are clear.
IMPRESSION: Stable right frontotemporal epidural hematoma with associated mild
mass effect upon the right cerebral hemisphere.

Stable small left probable subdural hematoma without significant
associated mass effect.

No interval hemorrhage.

## 2021-03-25 MED ORDER — FENTANYL CITRATE PF 50 MCG/ML IJ SOSY
PREFILLED_SYRINGE | INTRAMUSCULAR | Status: AC
  Administered 2021-03-25: 50 ug via INTRAVENOUS
  Filled 2021-03-25: qty 1

## 2021-03-25 MED ORDER — CALCIUM GLUCONATE-NACL 1-0.675 GM/50ML-% IV SOLN
1.0000 g | Freq: Once | INTRAVENOUS | Status: AC
  Administered 2021-03-25: 1000 mg via INTRAVENOUS
  Filled 2021-03-25: qty 50

## 2021-03-25 MED ORDER — INSULIN ASPART 100 UNIT/ML IJ SOLN
0.0000 [IU] | Freq: Three times a day (TID) | INTRAMUSCULAR | Status: DC
  Administered 2021-03-25: 8 [IU] via SUBCUTANEOUS
  Administered 2021-03-25: 5 [IU] via SUBCUTANEOUS
  Administered 2021-03-25: 11 [IU] via SUBCUTANEOUS
  Filled 2021-03-25 (×3): qty 1

## 2021-03-25 MED ORDER — POTASSIUM CHLORIDE 10 MEQ/100ML IV SOLN
10.0000 meq | INTRAVENOUS | Status: AC
  Administered 2021-03-25 (×4): 10 meq via INTRAVENOUS
  Filled 2021-03-25 (×4): qty 100

## 2021-03-25 MED ORDER — LEVETIRACETAM IN NACL 500 MG/100ML IV SOLN
500.0000 mg | Freq: Two times a day (BID) | INTRAVENOUS | Status: DC
  Administered 2021-03-25 – 2021-03-29 (×8): 500 mg via INTRAVENOUS
  Filled 2021-03-25 (×9): qty 100

## 2021-03-25 MED ORDER — FENTANYL CITRATE PF 50 MCG/ML IJ SOSY: 50.0000 ug | PREFILLED_SYRINGE | Freq: Once | INTRAMUSCULAR | Status: AC

## 2021-03-25 MED ORDER — INSULIN DETEMIR 100 UNIT/ML ~~LOC~~ SOLN
15.0000 [IU] | Freq: Two times a day (BID) | SUBCUTANEOUS | Status: DC
  Administered 2021-03-25 – 2021-03-29 (×9): 15 [IU] via SUBCUTANEOUS
  Filled 2021-03-25 (×10): qty 0.15

## 2021-03-25 MED ORDER — LABETALOL HCL 5 MG/ML IV SOLN
10.0000 mg | Freq: Once | INTRAVENOUS | Status: AC
  Administered 2021-03-25: 10 mg via INTRAVENOUS
  Filled 2021-03-25: qty 4

## 2021-03-25 MED ORDER — POTASSIUM CHLORIDE CRYS ER 20 MEQ PO TBCR
40.0000 meq | EXTENDED_RELEASE_TABLET | ORAL | Status: AC
Start: 2021-03-25 — End: 2021-03-25
  Administered 2021-03-25 (×3): 40 meq via ORAL
  Filled 2021-03-25 (×2): qty 2

## 2021-03-25 MED ORDER — K PHOS MONO-SOD PHOS DI & MONO 155-852-130 MG PO TABS
500.0000 mg | ORAL_TABLET | ORAL | Status: AC
  Administered 2021-03-25: 500 mg via ORAL
  Filled 2021-03-25 (×2): qty 2

## 2021-03-25 MED ORDER — LISINOPRIL 20 MG PO TABS
40.0000 mg | ORAL_TABLET | Freq: Every day | ORAL | Status: DC
  Administered 2021-03-25 – 2021-03-30 (×6): 40 mg via ORAL
  Filled 2021-03-25 (×6): qty 2

## 2021-03-25 MED ORDER — MIDAZOLAM HCL 2 MG/2ML IJ SOLN
INTRAMUSCULAR | Status: AC
  Administered 2021-03-25: 2 mg via INTRAVENOUS
  Filled 2021-03-25: qty 2

## 2021-03-25 MED ORDER — FENTANYL CITRATE PF 50 MCG/ML IJ SOSY
PREFILLED_SYRINGE | INTRAMUSCULAR | Status: AC
  Filled 2021-03-25: qty 1

## 2021-03-25 MED ORDER — LABETALOL HCL 5 MG/ML IV SOLN
10.0000 mg | Freq: Once | INTRAVENOUS | Status: AC
  Administered 2021-03-26: 10 mg via INTRAVENOUS

## 2021-03-25 MED ORDER — CHLORHEXIDINE GLUCONATE CLOTH 2 % EX PADS
6.0000 | MEDICATED_PAD | Freq: Every day | CUTANEOUS | Status: DC
  Administered 2021-03-25 – 2021-03-29 (×3): 6 via TOPICAL

## 2021-03-25 MED ORDER — POTASSIUM CHLORIDE CRYS ER 20 MEQ PO TBCR
40.0000 meq | EXTENDED_RELEASE_TABLET | Freq: Once | ORAL | Status: AC
  Administered 2021-03-25: 40 meq via ORAL
  Filled 2021-03-25: qty 2

## 2021-03-25 MED ORDER — AMLODIPINE BESYLATE 5 MG PO TABS
5.0000 mg | ORAL_TABLET | Freq: Every day | ORAL | Status: DC
  Administered 2021-03-25: 5 mg via ORAL
  Filled 2021-03-25: qty 1

## 2021-03-25 MED ORDER — MAGNESIUM SULFATE 2 GM/50ML IV SOLN
2.0000 g | Freq: Once | INTRAVENOUS | Status: DC
  Filled 2021-03-25: qty 50

## 2021-03-25 MED ORDER — MIDAZOLAM HCL 2 MG/2ML IJ SOLN
INTRAMUSCULAR | Status: AC
  Administered 2021-03-25: 1 mg via INTRAVENOUS
  Filled 2021-03-25: qty 2

## 2021-03-25 MED ORDER — MIDAZOLAM HCL 2 MG/2ML IJ SOLN: 1.0000 mg | Freq: Once | INTRAMUSCULAR | Status: AC

## 2021-03-25 MED ORDER — POTASSIUM CHLORIDE 2 MEQ/ML IV SOLN
INTRAVENOUS | Status: DC
  Filled 2021-03-25 (×11): qty 1000

## 2021-03-25 MED ORDER — POTASSIUM CHLORIDE 2 MEQ/ML IV SOLN
INTRAVENOUS | Status: DC
  Filled 2021-03-25 (×2): qty 1000

## 2021-03-25 MED ORDER — POTASSIUM CHLORIDE 10 MEQ/100ML IV SOLN
10.0000 meq | INTRAVENOUS | Status: AC
  Administered 2021-03-25 – 2021-03-26 (×3): 10 meq via INTRAVENOUS
  Filled 2021-03-25 (×3): qty 100

## 2021-03-25 MED ORDER — MIDAZOLAM HCL 2 MG/2ML IJ SOLN: 2.0000 mg | Freq: Once | INTRAMUSCULAR | Status: AC

## 2021-03-25 MED ORDER — LABETALOL HCL 100 MG PO TABS
100.0000 mg | ORAL_TABLET | Freq: Three times a day (TID) | ORAL | Status: DC
  Administered 2021-03-25 – 2021-03-27 (×7): 100 mg via ORAL
  Filled 2021-03-25 (×9): qty 1

## 2021-03-25 MED ORDER — MAGNESIUM SULFATE 4 GM/100ML IV SOLN
4.0000 g | Freq: Once | INTRAVENOUS | Status: AC
  Administered 2021-03-25: 4 g via INTRAVENOUS
  Filled 2021-03-25: qty 100

## 2021-03-25 MED ORDER — HYDRALAZINE HCL 20 MG/ML IJ SOLN
10.0000 mg | INTRAMUSCULAR | Status: DC | PRN
  Administered 2021-03-25 – 2021-03-26 (×3): 20 mg via INTRAVENOUS
  Filled 2021-03-25 (×3): qty 1

## 2021-03-25 MED ORDER — POTASSIUM CHLORIDE 10 MEQ/100ML IV SOLN
10.0000 meq | INTRAVENOUS | Status: AC
  Administered 2021-03-25 (×5): 10 meq via INTRAVENOUS
  Filled 2021-03-25 (×5): qty 100

## 2021-03-25 MED ORDER — LACTATED RINGERS IV BOLUS
1000.0000 mL | Freq: Once | INTRAVENOUS | Status: AC
  Administered 2021-03-25: 1000 mL via INTRAVENOUS

## 2021-03-25 MED ORDER — CARVEDILOL 6.25 MG PO TABS: 6.2500 mg | ORAL_TABLET | Freq: Two times a day (BID) | ORAL | Status: DC

## 2021-03-25 MED ORDER — KCL-LACTATED RINGERS-D5W 20 MEQ/L IV SOLN
INTRAVENOUS | Status: DC
  Filled 2021-03-25 (×2): qty 1000

## 2021-03-25 MED ORDER — LORAZEPAM 2 MG/ML IJ SOLN: 2.0000 mg | INTRAMUSCULAR | Status: DC | PRN

## 2021-03-25 MED ORDER — AMLODIPINE BESYLATE 10 MG PO TABS
10.0000 mg | ORAL_TABLET | Freq: Every day | ORAL | Status: DC
  Administered 2021-03-26 – 2021-03-30 (×5): 10 mg via ORAL
  Filled 2021-03-25 (×5): qty 1

## 2021-03-25 MED ORDER — MAGNESIUM SULFATE 2 GM/50ML IV SOLN
2.0000 g | Freq: Once | INTRAVENOUS | Status: AC
  Administered 2021-03-25: 2 g via INTRAVENOUS
  Filled 2021-03-25: qty 50

## 2021-03-25 MED ORDER — LIVING WELL WITH DIABETES BOOK
Freq: Once | Status: AC
  Filled 2021-03-25: qty 1

## 2021-03-25 MED ORDER — POTASSIUM CHLORIDE 2 MEQ/ML IV SOLN
INTRAVENOUS | Status: AC
  Filled 2021-03-25: qty 1000

## 2021-03-25 NOTE — Consult Note (Addendum)
NEUROLOGY CONSULTATION NOTE   Date of service: March 25, 2021 Patient Name: Chase Rodriguez MRN:  [id number] DOB:  01/20/1961 Reason for consult: suspected first time seizure resulting in head trauma with epidural and subdural hematomas Requesting physician: Dr. Otelia Limes _ _ _   _ __   _ __ _ _  __ __   _ __   __ _  History of Present Illness    This is a 60 yo man with pmhx significant for DM2, HTN, HL, CKD stage 3b admitted for seizure-like activity leading to head trauma c/b small epidural and subdural hematomas. Patient has no known seizure history and does not recall the event in question. Denies prodromal sx. Per H&P, girlfriend reported pt was sitting on couch eating food, then suddenly slumped over and lost consciousness and had R sided rhythmic jerking lasting at least several minutes and urinary incontinence. He was lethargic on arrival but returned to mental status baseline while still in the ED. No further events c/f seizure.   Head CT in ED showed:  1. Acute right temporal extra-axial hematoma suspicious for epidural hematoma. This measures up to 0.9 cm in maximal thickness with mild underlying sulcal effacement. No midline shift. 2. Small contralateral acute left temporal subdural hematoma measuring up to 0.4 cm. No mass effect. 3. No evidence of acute infarct.  CNS imaging personally reviewed; I agree with above interpretation.   UDS pan-negative  [facility]U consulted who recommended BP control goal SBP <160, no surgical intervention indicated at this time. He was loaded with keppra and started on 1000mg  bid.  Since admission patient has had hyperglycemia (BG 700+ on arrival) and refractory hypokalemia (2.0 this afternoon despite aggressive repletion since admission) without significant hypomagnesemia    ROS   Per HPI: all other systems reviewed and are negative  Past History   I have reviewed the following:  Past Medical History:  Diagnosis Date  . Diabetes  mellitus without complication ([place])   . Hepatitis   . High cholesterol   . Hypertension   . Stage 3b chronic kidney disease (CKD) ([place])    Past Surgical History:  Procedure Laterality Date  . TONSILLECTOMY     History reviewed. No pertinent family history. Social History   Socioeconomic History  . Marital status: Single    Spouse name: Not on file  . Number of children: Not on file  . Years of education: Not on file  . Highest education level: Not on file  Occupational History  . Not on file  Tobacco Use  . Smoking status: Every Day    Packs/day: 0.50    Years: 39.00    Pack years: 19.50    Types: Cigarettes  . Smokeless tobacco: Never  Substance and Sexual Activity  . Alcohol use: Yes    Comment: sometimes 1-2 drinks  . Drug use: No  . Sexual activity: Not on file  Other Topics Concern  . Not on file  Social History Narrative  . Not on file   Social Determinants of Health   Financial Resource Strain: Not on file  Food Insecurity: Not on file  Transportation Needs: Not on file  Physical Activity: Not on file  Stress: Not on file  Social Connections: Not on file   No Known Allergies  Medications   Medications Prior to Admission  Medication Sig Dispense Refill Last Dose  . atorvastatin (LIPITOR) 20 MG tablet Take 20 mg by mouth daily.   Unknown at Unknown  .  carvedilol (COREG) 6.25 MG tablet Take 6.25 mg by mouth 2 (two) times daily.   Unknown at Unknown  . cetirizine (ZYRTEC) 10 MG tablet Take 10 mg by mouth daily.   Unknown at Unknown  . lisinopril (ZESTRIL) 40 MG tablet Take 40 mg by mouth daily.   03/24/2021 at Unknown  . Potassium Chloride ER 20 MEQ TBCR Take 1 tablet by mouth daily.   03/25/2021      Current Facility-Administered Medications:  .  fentaNYL (SUBLIMAZE) 50 MCG/ML injection, , , ,  .  amLODipine (NORVASC) tablet 5 mg, 5 mg, Oral, Daily, Jonnie Finner, Michele Mcalpine, MD, 5 mg at 03/25/21 1023 .  calcium gluconate 1 g/ 50 mL sodium chloride IVPB, 1  g, Intravenous, Once, Rito Ehrlich A, RPH .  Chlorhexidine Gluconate Cloth 2 % PADS 6 each, 6 each, Topical, Daily, Darel Hong D, NP .  dextrose 50 % solution 0-50 mL, 0-50 mL, Intravenous, PRN, Rust-Chester, Toribio Harbour L, NP .  docusate sodium (COLACE) capsule 100 mg, 100 mg, Oral, BID PRN, Rust-Chester, Britton L, NP .  hydrALAZINE (APRESOLINE) injection 10-20 mg, 10-20 mg, Intravenous, Q4H PRN, Rust-Chester, Britton L, NP .  insulin aspart (novoLOG) injection 0-15 Units, 0-15 Units, Subcutaneous, TID AC & HS, Rust-Chester, Britton L, NP, 8 Units at 03/25/21 1644 .  insulin detemir (LEVEMIR) injection 15 Units, 15 Units, Subcutaneous, BID, Rust-Chester, Britton L, NP, 15 Units at 03/25/21 0820 .  labetalol (NORMODYNE) tablet 100 mg, 100 mg, Oral, TID, Jonnie Finner Michele Mcalpine, MD, 100 mg at 03/25/21 1521 .  lactated ringers bolus 1,000 mL, 1,000 mL, Intravenous, Once, Darel Hong D, NP .  levETIRAcetam (KEPPRA) IVPB 1000 mg/100 mL premix, 1,000 mg, Intravenous, Q12H, Rust-Chester, Huel Cote, NP, Paused at 03/25/21 1042 .  lisinopril (ZESTRIL) tablet 40 mg, 40 mg, Oral, Daily, Darel Hong D, NP, 40 mg at 03/25/21 1022 .  LORazepam (ATIVAN) injection 2 mg, 2 mg, Intravenous, Q1H PRN, Rust-Chester, Britton L, NP .  phosphorus (K PHOS NEUTRAL) tablet 500 mg, 500 mg, Oral, Q4H, Nazari, Walid A, RPH, 500 mg at 03/25/21 1521 .  polyethylene glycol (MIRALAX / GLYCOLAX) packet 17 g, 17 g, Oral, Daily PRN, Rust-Chester, Britton L, NP .  potassium chloride SA (KLOR-CON) CR tablet 40 mEq, 40 mEq, Oral, Q2H, Schertz, Michele Mcalpine, MD, 40 mEq at 03/25/21 1520  Vitals   Vitals:   03/25/21 1300 03/25/21 1400 03/25/21 1600 03/25/21 1800  BP: (!) 169/92 (!) 169/82 (!) 164/90 (!) 205/103  Pulse: 66 66 66 79  Resp: 20 14  (!) 23  Temp:   98.2 F (36.8 C)   TempSrc:   Oral   SpO2: 99% 98% 100% 100%  Weight:      Height:         Body mass index is 31.06 kg/m.  Physical Exam   Physical Exam Gen:  A&O x4, NAD CV: RRR, no m/g/r; nml S1 and S2. 2+ symmetric peripheral pulses. Resp: CTAB  Neuro: *MS: A&O x4. Follows multi-step commands.  *Speech: fluid, nondysarthric, able to name and repeat *CN:    I: Deferred   II,III: PERRLA, VFF by confrontation, optic discs unable to be visualized 2/2 pupillary constriction   III,IV,VI: EOMI w/o nystagmus, no ptosis   V: Sensation intact from V1 to V3 to LT   VII: Eyelid closure was full.  Smile symmetric.   VIII: Hearing intact to voice   IX,X: Voice normal, palate elevates symmetrically    XI: SCM/trap 5/5 bilat   XII:  Tongue protrudes midline, no atrophy or fasciculations   *Motor:   Normal bulk.  No tremor, rigidity or bradykinesia. No pronator drift.    Strength: Dlt Bic Tri WrE WrF FgS Gr HF KnF KnE PlF DoF    Left 5 5 5 5 5 5 5 5 5 5 5 5     Right 5 5 5 5 5 5 5 5 5 5 5 5     *Sensory: Intact to light touch, pinprick, temperature vibration throughout. Symmetric. Propioception intact bilat.  No double-simultaneous extinction.  *Coordination:  Finger-to-nose, heel-to-shin, rapid alternating motions were intact. *Reflexes:  2+ and symmetric throughout without clonus; toes down-going bilat *Gait: deferred  NIHSS = 0   Premorbid mRS = 0   Labs   CBC:  Recent Labs  Lab 03/24/21 1405 03/25/21 0441  WBC 8.7 8.7  HGB 15.1 14.2  HCT 42.0 39.1  MCV 81.1 80.0  PLT 249 935    Basic Metabolic Panel:  Lab Results  Component Value Date   NA 137 03/25/2021   K 2.0 (LL) 03/25/2021   CO2 32 03/25/2021   GLUCOSE 227 (H) 03/25/2021   BUN 17 03/25/2021   CREATININE 2.06 (H) 03/25/2021   CALCIUM 7.6 (L) 03/25/2021   GFRNONAA 37 (L) 03/25/2021   Lipid Panel:  Lab Results  Component Value Date   LDLCALC 170 (H) 03/25/2021   HgbA1c:  Lab Results  Component Value Date   HGBA1C 12.5 (H) 03/25/2021   Urine Drug Screen:     Component Value Date/Time   LABOPIA NONE DETECTED 03/25/2021 0130   COCAINSCRNUR NONE DETECTED  03/25/2021 0130   LABBENZ NONE DETECTED 03/25/2021 0130   AMPHETMU NONE DETECTED 03/25/2021 0130   THCU NONE DETECTED 03/25/2021 0130   LABBARB NONE DETECTED 03/25/2021 0130    Alcohol Level No results found for: ETH   Impression   This is a 60 yo man with pmhx significant for DM2, HTN, HL, CKD stage 3b admitted for seizure-like activity leading to head trauma c/b small epidural and subdural hematomas. He is back to his mental status baseline with a currently normal neurologic exam. Patient has no known hx seizure. Suspect seizure provoked in setting of severe hyperglycemia, but will order MRI brain and rEEG to rule out underlying structural defect or other confounding variables.  Recommendations   - MRI brain wo contrast (no contrast 2/2 CKD) - Continue keppra 500mg  bid (changed order from 1000mg  bid maint) - rEEG Mon AM (not available on weekends) - BP control goal SBP<160 - No current indication for surgical intervention per NSU - STAT head CT for any decline in neurologic exam - Seizure precautions  Will continue to follow. ______________________________________________________________________   Thank you for the opportunity to take part in the care of this patient. If you have any further questions, please contact the neurology consultation attending.  Signed,  Su Monks, MD [facility] [phone]  If 7pm- 7am, please page neurology on call as listed in [place].;

## 2021-03-25 NOTE — Progress Notes (Signed)
NAME:  Chase Rodriguez, MRN:  [id number], DOB:  [birth year], LOS: 1 ADMISSION DATE:  03/24/2021, CONSULTATION DATE:  03/24/2021 REFERRING MD:  Dr. Charlsie Quest, CHIEF COMPLAINT:  Seizures   History of Present Illness:  60 year old male presenting to [facility] ED from home via EMS on 03/24/2021 for evaluation of a possible seizure.  Per ED documentation & with the assistance of girlfriend's report bedside, the patient was seated on the couch eating some food felt fine and then woke up in ambulance.  Girlfriend reported he seemed okay until he suddenly slumped over and syncopized grip jerking and shaking his right arm only, with correlating incontinence of urine.  He fell to the ground striking the right side of his head on the carpeted floor.  She reported possibly as much as 30 minutes of this right arm seizure activity & unresponsive state prior to self resolving.  He was slow to recover and seemed confused not saying anything until she met back up with him at the hospital. ED course: Patient has been hypertensive since arrival, with hyperglycemia.  CT head as below shows small epidural and subdural hematomas.  Dr. Charlsie Quest spoke with neurosurgeon on-call: Dr. Dava Najjar who reviewed the images from home.  Dr. Dava Najjar recommended maintaining SBP <160, Q 1 h neurochecks, repeat CT head in 3 hours and admission to ICU. medications given: LR bolus 1L, labetalol 10 mg followed by Nicardipine drip, Keppra load 1500 mg, Mag 2g IVP, K+ 40 meq PO followed by 40 meq IVP Initial Vitals: 98.2, 18, HR 100, 175/114 & SpO2 99% on RA Significant labs: (Labs/ Imaging personally reviewed) I, Domingo Pulse Rust-Chester, AGACNP-BC, personally viewed and interpreted this ECG. EKG Interpretation: Date: 03/24/21, EKG Time: 14:04, Rate: 100, Rhythm: ST,  QRS Axis:  normal, Intervals: prolonged QTc, ST/T Wave abnormalities: nonspecific T wave abnormalities, Narrative Interpretation: ST with LVH, prolonged QTc & non specific T wave  abnormalities Chemistry: Na+: 127, K+: 2.3, Cl: 88, BUN/Cr.:  23/2.48, Serum CO2/ AG: 28/11,  Mg: 1.4, Glucose: 768 Hematology: WBC: 8.7, Hgb: 15.1,  Troponin: 88,  COVID-19 & Influenza A/B: negative VBG: 7.40/ 53/33/32.8  CT head without contrast 03/24/2021: Acute right temporal extra-axial hematoma suspicious for epidural hematoma, measuring 0.9 cm with mild underlying sulcal effacement, no midline shift.  Small contralateral acute left temporal subdural hematoma measuring up to 0.4 cm, no mass-effect.  No evidence of acute infarct  PCCM consulted for admission due to the need for every hour neurochecks and nicardipine drip to maintain blood pressure parameters. Pertinent  Medical History  HTN HLD T2DM Hepatitis  Significant Hospital Events: Including procedures, antibiotic start and stop dates in addition to other pertinent events   03/24/2021-patient admitted to ICU requiring nicardipine drip for blood pressure control in the setting of suspected new onset seizure activity & small epidural and subdural hematomas. 03/25/2021: Nicardipine weaned off. Resume home antihypertensives.  Neuro exam stable, evaluated by Neurosurgery, no surgical intervention need.  Downgrade to Stepdown status.  Interim History / Subjective:  -No significant events noted overnight -Afebrile, on room air, hemodynamically stable, has been weaned off Nicardipine gtt -Will resume home Coreg and Lisinopril -Neuro exam is stable -Neurosurgery evaluated at bedside ~ no surgical intervention needed, continue to maintain SBP >160, may downgrade to Stepdown status -Neurology consult pending -Severe Hypokalemia on BMP ~ currently repleting; pharmacy consulting for assistance, will follow BMP q4h for now -Check Thyroid panel  Objective   Blood pressure 135/82, pulse 74, temperature 98 F (36.7 C), temperature  source Oral, resp. rate 14, height '5\' 11"'  (1.803 m), weight 101 kg, SpO2 96 %.        Intake/Output  Summary (Last 24 hours) at 03/25/2021 0859 Last data filed at 03/25/2021 2409 Gross per 24 hour  Intake 2706.91 ml  Output 1695 ml  Net 1011.91 ml    Filed Weights   03/24/21 1403 03/24/21 2244 03/25/21 0500  Weight: 89.8 kg 99.2 kg 101 kg    Examination: General: Adult male, acutely ill, lying in bed, NAD HEENT: MM pink/moist, anicteric, atraumatic, neck supple Neuro: A&O x 4, able to follow commands, PERRL +3, MAE: 5/5 BUE & 4/5 BLE CV: s1s2 RRR, NSR on monitor, no r/m/g Pulm: Regular, non labored on room air, breath sounds clear throughout, no wheezing or rales noted GI: soft, rounded, non tender, bs x 4 Skin: Limited exam- no rashes/lesions noted Extremities: warm/dry, pulses + 2 R/P, no edema noted  Resolved Hospital Problem list     Assessment & Plan:   Acute Right Temporal Epidural hematoma & small contralateral acute left temporal subdural hematoma secondary to syncope and fall in the setting of new onset seizure activity  -Stepdown monitoring -Evaluated by Neurosurgery this morning, appreciate input ~ No surgical intervention needed at this time, may be downgraded to Stepdown status with Neuro checks q4h -Maintain SBP <160 -Nicardipine drip if needed ~ currently weaned off -Resume home Coreg and Lisinopril -If any worsening in mentation > repeat Head CT and neurosurgery will be alerted to change for possible intervention  Hypertensive Urgency PMHx: HTN Per patient report he has been taking his antihypertensives daily and did not miss a dose today. - continue Nicardipine drip as above - Resume home Lisinopril and Coreg - goal SBP < 160 - UDS negative - continuous cardiac monitoring  Suspected New Onset Seizure activity Patient loaded with 1500 mg of Keppra in ED - continue 1000 mg Keppra BID - Ativan PRN for seizure activity - EEG pending - Frequent neuro checks, as above - seizure & fall precautions - Neurology consulted, appreciate input  Hyperglycemia  secondary to HHS vs uncontrolled Type 2 Diabetes Mellitus Glucose: 768, CO2: 28, AG: 11, Beta-hydrox: 0.12, serum osmolality: 306 According to patient he is currently taking metformin, unclear per med reconcile if he has taken any diabetes medication in the past 6 months Hemoglobin A1C: pending - Insulin drip initiated per hyperglycemia protocol - Monitor CBG Q 1, per Endo tool until able to transition - LR @ 125 mL/h  - target range while in ICU: 140-180 - follow ICU hyper/hypo-glycemia protocol - consult Diabetes Coordinator, appreciate input  Acute Kidney Injury 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 Pseudohyponatremia in the setting of hyperglycemia-127 Hypokalemia-2.3 Hypomagnesemia-1.4 Patient received potassium and magnesium supplementation in ED Baseline Cr: 1.95 (per doc from 2021), Cr on admission: 2.48 -Monitor I&O's / urinary output -Follow BMP -Ensure adequate renal perfusion -Avoid nephrotoxic agents as able -Replace electrolytes as indicated -Pharmacy consulted for assistance with electrolyte replacement -IV Fluids  Hyperlipidemia - continue outpatient Lipitor daily - f/u lipid panel  Tobacco use 20 year pack history - smoking cessation counseling provided - Once BP has stabilized can consider nicotine patch  Best Practice (right click and "Reselect all SmartList Selections" daily)  Diet/type: Heart healthy/carb modified DVT prophylaxis: SCD (NO Chemical Prophylaxis due to epidural & subdural hematomas) GI prophylaxis: PPI Lines: N/A Foley:  N/A Code Status:  full code Last date of multidisciplinary goals of care discussion [03/25/21]  Labs   CBC: Recent Labs  Lab 03/24/21 1405 03/25/21 0441  WBC 8.7 8.7  HGB 15.1 14.2  HCT 42.0 39.1  MCV 81.1 80.0  PLT 249 240     Basic Metabolic Panel: Recent Labs  Lab 03/24/21 1405 03/25/21 0155 03/25/21 0441  NA 127* 133*  --   K 2.3* <2.0*  --   CL 88* 96*  --   CO2 28 28  --   GLUCOSE 768* 336*  --   BUN  23* 19  --   CREATININE 2.48* 2.10*  --   CALCIUM 8.3* 7.9*  --   MG 1.4*  --  1.7  PHOS  --   --  2.1*    GFR: Estimated Creatinine Clearance: 46.4 mL/min (A) (by C-G formula based on SCr of 2.1 mg/dL (H)). Recent Labs  Lab 03/24/21 1405 03/25/21 0441  WBC 8.7 8.7     Liver Function Tests: Recent Labs  Lab 03/24/21 2105  AST 21  ALT 12  ALKPHOS 65  BILITOT 0.5  PROT 7.4  ALBUMIN 3.1*    No results for input(s): LIPASE, AMYLASE in the last 168 hours. No results for input(s): AMMONIA in the last 168 hours.  ABG    Component Value Date/Time   HCO3 32.8 (H) 03/24/2021 2053   O2SAT 63.6 03/24/2021 2053      Coagulation Profile: Recent Labs  Lab 03/24/21 2105  INR 1.0     Cardiac Enzymes: No results for input(s): CKTOTAL, CKMB, CKMBINDEX, TROPONINI in the last 168 hours.  HbA1C: No results found for: HGBA1C  CBG: Recent Labs  Lab 03/25/21 0436 03/25/21 0528 03/25/21 0622 03/25/21 0806 03/25/21 0856  GLUCAP 181* 171* 169* 164* 190*     Review of Systems: Positives in BOLD  Gen: Denies fever, chills, weight change, fatigue, night sweats HEENT: Denies blurred vision, double vision, hearing loss, tinnitus, sinus congestion, rhinorrhea, sore throat, neck stiffness, dysphagia PULM: Denies shortness of breath, cough, sputum production, hemoptysis, wheezing CV: Denies chest pain, edema, orthopnea, paroxysmal nocturnal dyspnea, palpitations GI: Denies abdominal pain, nausea, vomiting, diarrhea, hematochezia, melena, constipation, change in bowel habits GU: Denies dysuria, hematuria, polyuria, oliguria, urethral discharge Endocrine: Denies hot or cold intolerance, polyuria, polyphagia or appetite change Derm: Denies rash, dry skin, scaling or peeling skin change Heme: Denies easy bruising, bleeding, bleeding gums Neuro: Denies headache, numbness, weakness, slurred speech, loss of memory or consciousness  Past Medical History:  He,  has a past medical  history of Diabetes mellitus without complication ([place]), Hepatitis, High cholesterol, Hypertension, and Stage 3b chronic kidney disease (CKD) ([place]).   Surgical History:   Past Surgical History:  Procedure Laterality Date   TONSILLECTOMY       Social History:   reports that he has been smoking cigarettes. He has a 19.50 pack-year smoking history. He has never used smokeless tobacco. He reports current alcohol use. He reports that he does not use drugs.   Family History:  His family history is not on file.   Allergies No Known Allergies   Home Medications  Prior to Admission medications   Medication Sig Start Date End Date Taking? Authorizing Provider  atorvastatin (LIPITOR) 20 MG tablet Take 20 mg by mouth daily.    [provider]  gabapentin (NEURONTIN) 300 MG capsule Take 1 capsule (300 mg total) by mouth 2 (two) times daily. 08/19/17 09/18/17  Fisher, Linden Dolin, PA-C  insulin detemir (LEVEMIR) 100 UNIT/ML injection Inject 50 Units into the skin at bedtime.    [provider]  lisinopril-hydrochlorothiazide (PRINZIDE,ZESTORETIC) 20-25 MG tablet Take 1 tablet by mouth daily.    [provider]  metFORMIN (GLUCOPHAGE) 1000 MG tablet Take 1,000 mg by mouth 2 (two) times daily with a meal.    [provider]     Critical care time: 38 minutes     Darel Hong, AGACNP-BC [facility] Prefer epic messenger for cross cover needs If after hours, please call E-link

## 2021-03-25 NOTE — Progress Notes (Signed)
PHARMACY CONSULT NOTE - FOLLOW UP  Pharmacy Consult for Electrolyte Monitoring and Replacement   Recent Labs: Potassium (mmol/L)  Date Value  03/25/2021 2.2 (LL)   Magnesium (mg/dL)  Date Value  03/25/2021 1.7   Calcium (mg/dL)  Date Value  03/25/2021 7.6 (L)   Albumin (g/dL)  Date Value  03/24/2021 3.1 (L)   Phosphorus (mg/dL)  Date Value  03/25/2021 2.1 (L)   Sodium (mmol/L)  Date Value  03/25/2021 135   Corrected Ca: 8.32  Assessment: Pharmacy has been consulted to monitor electrolytes in 60yo patient with epidural and subdural hematoma following a witnessed fall possibly d/t seizure activity.   IVF: LR w/ 20 mEq KCL@125ml /hr  Goal of Therapy: Electrolytes WNL   Plan:  --K 2.2, will continue 56mEq in IVF and order KCL IV 8mEq x 5 runs --Corrected Ca: 8.32, Calcium gluconate 1g IV x 1 --Phos 2.1, Kphos PO 500mg  x 2 doses --Mg 1.7, Mag sulfate 2g IV x 1 --will f/u with K level this evening at 2000 --other electrolytes with AM labs    Pearla Dubonnet ,PharmD Clinical Pharmacist 03/25/2021 1:07 PM

## 2021-03-25 NOTE — TOC Initial Note (Signed)
Transition of Care [facility]) - Initial/Assessment Note    Patient Details  Name: Chase Rodriguez MRN: [id number] Date of Birth: [birth year]  Transition of Care [facility]) CM/SW Contact:    Kerin Salen, RN Phone Number: 03/25/2021, 3:36 PM  Clinical Narrative: TOCRN unable to assess patient asleep.                       Patient Goals and CMS Choice        Expected Discharge Plan and Services                                                Prior Living Arrangements/Services                       Activities of Daily Living Home Assistive Devices/Equipment: None ADL Screening (condition at time of admission) Patient's cognitive ability adequate to safely complete daily activities?: Yes Is the patient deaf or have difficulty hearing?: No Does the patient have difficulty seeing, even when wearing glasses/contacts?: No Does the patient have difficulty concentrating, remembering, or making decisions?: No Patient able to express need for assistance with ADLs?: Yes Does the patient have difficulty dressing or bathing?: No Independently performs ADLs?: Yes (appropriate for developmental age) Does the patient have difficulty walking or climbing stairs?: No Weakness of Legs: None Weakness of Arms/Hands: None  Permission Sought/Granted                  Emotional Assessment              Admission diagnosis:  Hypokalemia [E87.6] Hypomagnesemia [E83.42] Seizure ([place]) [R56.9] Seizures ([place]) [R56.9] Epidural hematoma [S06.4XAA] Hyperosmolar hyperglycemic state (HHS) ([place]) [E11.00] Patient Active Problem List   Diagnosis Date Noted   Epidural hematoma    Seizure ([place]) 03/24/2021   PCP:  [facility], [place]:   [place], [place] - [place] [address] [place] [address] Phone: [phone] Fax: [phone]     Social Determinants of Health (SDOH) Interventions    Readmission Risk  Interventions No flowsheet data found.

## 2021-03-25 NOTE — Plan of Care (Signed)
Events: Central line placed by Darel Hong, NP for poor venous access and frequent blood draws.   Problem: Clinical Measurements: Goal: Respiratory complications will improve Outcome: Progressing   Problem: Activity: Goal: Risk for activity intolerance will decrease Outcome: Progressing   Problem: Elimination: Goal: Will not experience complications related to bowel motility Outcome: Progressing Goal: Will not experience complications related to urinary retention Outcome: Progressing   Problem: Skin Integrity: Goal: Risk for impaired skin integrity will decrease Outcome: Progressing

## 2021-03-25 NOTE — Progress Notes (Addendum)
eLink Physician-Brief Progress Note Patient Name: Chase Rodriguez DOB: 20-Mar-1961 MRN: [id number]   Date of Service  03/25/2021  HPI/Events of Note  Patient admitted to [facility]  ICU with witnessed seizure at home, altered mental status, and found on imaging to have a right fronto-temporal epidural hematoma with some mass effect on the right cerebral hemisphere, and a left subdural hematoma without mass effect, neurosurgery consulted and following, Keppra started, EEG, serial neuro-check, and BP control with Cardene. Neurology consultation.  eICU Interventions  New Patient evaluation.        [name] 03/25/2021, 1:53 AM

## 2021-03-25 NOTE — Progress Notes (Addendum)
PHARMACY CONSULT NOTE - FOLLOW UP  Pharmacy Consult for Electrolyte Monitoring and Replacement   Recent Labs: Potassium (mmol/L)  Date Value  03/25/2021 <2.0 (LL)   Magnesium (mg/dL)  Date Value  03/24/2021 1.4 (L)   Calcium (mg/dL)  Date Value  03/25/2021 7.9 (L)   Albumin (g/dL)  Date Value  03/24/2021 3.1 (L)   Sodium (mmol/L)  Date Value  03/25/2021 133 (L)     Assessment: 10/28 @ 1405 :  K = 2.3  10/29 @ 0155 :  K = < 2.0  Goal of Therapy: Electrolytes WNL   Plan:  10/28:  K @ 1405 = 2.3       KCl 10 mEq IV X 4 started @ 2127       KCl  40 mEQ PO X 1 given @ 2128.  10/29:  K @ 0155 = < 2.0       KCl 10 mEq IV X 4 started @ 0401.       KCl 40 mEq PO X 1 given @ 0351.             D5LR with 20 mEq KCl started @ 125 ml/Hr          started on 10/29 @ 0400.        LR with 20 mEq ordered but currently on       hold.   Will recheck electrolytes 1 hr after IV KCl completes on 10/29 @ 0900.   Orene Desanctis ,PharmD Clinical Pharmacist 03/25/2021 4:07 AM

## 2021-03-25 NOTE — Progress Notes (Signed)
Inpatient Diabetes Program Recommendations  AACE/ADA: New Consensus Statement on Inpatient Glycemic Control   Target Ranges:  Prepandial:   less than 140 mg/dL      Peak postprandial:   less than 180 mg/dL (1-2 hours)      Critically ill patients:  140 - 180 mg/dL   Results for Chase Rodriguez (MRN [id number]) as of 03/25/2021 08:27  Ref. Range 03/25/2021 01:29 03/25/2021 02:29 03/25/2021 03:31 03/25/2021 04:36 03/25/2021 05:28 03/25/2021 06:22 03/25/2021 08:06  Glucose-Capillary Latest Ref Range: 70 - 99 mg/dL 326 (H) 276 (H) 208 (H) 181 (H) 171 (H) 169 (H) 164 (H)  Levemir 15 units _0 :20   Results for Chase, Rodriguez (MRN [id number]) as of 03/25/2021 08:27  Ref. Range 03/24/2021 14:05  Glucose Latest Ref Range: 70 - 99 mg/dL 768 (HH)   Review of Glycemic Control  Diabetes history: DM2 Outpatient Diabetes medications: None on home med list; noted H&P has Levemir 50 units daily, Metformin 1000 mg BID Current orders for Inpatient glycemic control: Levemir 15 units BID, Novolog 0-15 units AC&HS  NOTE: Noted consult for Diabetes Coordinator. Diabetes Coordinator is not on campus over the weekend but available by pager from 8am to 5pm for questions or concerns.  Chart reviewed. Patient has no insurance but has [facility] listed as PCP in chart. There are no medication on home mediation list in chart for DM. Patient presented to hospital via EMS after having seizure at home. Initial glucose 768 mg/dl on 03/24/21 and patient was started on IV insulin. Patient now has transition SQ insulin orders and was given Levemir 15 units at 8:20 am today. Agree with current orders at this time.   Spoke with patient over the phone about diabetes and home regimen for diabetes control. Patient reports being followed by [facility]. Patient states he has not taken any DM medications in a couple of years but notes that he last went to [facility] last a  couple of months ago. Patient states that the provider at clinic did not mention anything about his glucose. Patient states he took pills and insulin in the past but does not remember the name of them. Asked if Metformin and Levemir sounded familiar and patient stated "Yeah Yeah".  Explained that initial glucose was 768 mg/dl. Discussed glucose and A1C goals. Discussed importance of checking CBGs and maintaining good CBG control to prevent long-term and short-term complications. Explained how hyperglycemia leads to damage within blood vessels which lead to the common complications seen with uncontrolled diabetes. Stressed to the patient the importance of improving glycemic control to prevent further complications from uncontrolled diabetes. Discussed impact of nutrition, exercise, stress, sickness, and medications on diabetes control.  Patient reports that he does not really follow a diabetic diet most of the time and states that he drinks regular sodas, juice, Kool-Aid, and sweet tea. Encouraged patient to eliminate sugary beverages; encouraged to use diet soda, unsweetened tea and Kool-Aid and use artificial sweetener for tea and Kool-aid. Patient does not have a working glucometer or testing supplies. Will ask TOC to provide glucose monitoring kit if available.  Patient reports that he used insulin pens for insulin injections in the past and is agreeable to using insulin for DM control if prescribed at discharge. Encouraged patient to check glucose as directed by provider at discharge, to take DM medications consistently, and to follow up consistently with [facility].  Patient verbalized understanding of information discussed and reports  no further questions at this time related to diabetes.   Thanks, Barnie Alderman, RN, MSN, CDE Diabetes Coordinator Inpatient Diabetes Program [phone] (Team Pager)

## 2021-03-25 NOTE — Procedures (Signed)
Central Venous Catheter Insertion Procedure Note  Chase Rodriguez  [id number]  01-02-1961  Date:03/25/21  Time:7:13 PM   Provider Performing:Derryl Uher D Dewaine Conger   Procedure: Insertion of Non-tunneled Central Venous [phone]) with US guidance ([id number])   Indication(s) Medication administration and Difficult access  Consent Risks of the procedure as well as the alternatives and risks of each were explained to the patient and/or caregiver.  Consent for the procedure was obtained and is signed in the bedside chart  Anesthesia Topical only with 1% lidocaine   Timeout Verified patient identification, verified procedure, site/side was marked, verified correct patient position, special equipment/implants available, medications/allergies/relevant history reviewed, required imaging and test results available.  Sterile Technique Maximal sterile technique including full sterile barrier drape, hand hygiene, sterile gown, sterile gloves, mask, hair covering, sterile ultrasound probe cover (if used).  Procedure Description Area of catheter insertion was cleaned with chlorhexidine and draped in sterile fashion.  With real-time ultrasound guidance a central venous catheter was placed into the left internal jugular vein. Nonpulsatile blood flow and easy flushing noted in all ports.  The catheter was sutured in place and sterile dressing applied.  Complications/Tolerance None; patient tolerated the procedure well. Chest X-ray is ordered to verify placement for internal jugular or subclavian cannulation.   Chest x-ray is not ordered for femoral cannulation.  EBL Minimal  Specimen(s) None   Line secured at the 20 cm mark.  BIOPATCH applied to the insertion site.     Darel Hong, AGACNP-BC [facility] Prefer epic messenger for cross cover needs If after hours, please call E-link

## 2021-03-25 NOTE — Progress Notes (Signed)
PHARMACY CONSULT NOTE - FOLLOW UP  Pharmacy Consult for Electrolyte Monitoring and Replacement   Recent Labs: Potassium (mmol/L)  Date Value  03/25/2021 2.5 (LL)   Magnesium (mg/dL)  Date Value  03/25/2021 1.6 (L)   Calcium (mg/dL)  Date Value  03/25/2021 7.3 (L)   Albumin (g/dL)  Date Value  03/24/2021 3.1 (L)   Phosphorus (mg/dL)  Date Value  03/25/2021 3.3   Sodium (mmol/L)  Date Value  03/25/2021 137   Corrected Ca: 8.32  Assessment: Pharmacy has been consulted to monitor electrolytes in 59yo patient with epidural and subdural hematoma following a witnessed fall possibly d/t seizure activity.   IVF: LR w/ 20 mEq KCL@75ml /hr  Goal of Therapy: Electrolytes WNL   Plan:  K up from 2.0 to 2.5 after 5 doses of Kcl 6meq IVPB, will give Kcl 66meq x 3 more doses. LR with 55meq KCL @ 61ml/hr to continue Mg below goal at 1.6, will replace with MgSul;fate 2mg  x 1. Will follow up morning labs and continue replacement as needed.   Melania Kirks Rodriguez-Guzman PharmD, BCPS 03/25/2021 9:30 PM

## 2021-03-25 NOTE — Consult Note (Signed)
Referring Physician:  No referring provider defined for this encounter.  Primary Physician:  [facility]  Chief Complaint:  syncopal episode/seizure.  History of Present Illness: Chase Rodriguez is a 60 y.o. male who presents with the chief complaint of yncopal episode/seizure.    According to girlfriend, patent was sitting on couch and eating food and then slumped over and had jerking of his right arm and incontinence.  He fell to the ground striking the right side of his head on the carpeted floor.  She reported possibly as much as 30 minutes of this right arm seizure activity & unresponsive state prior to self resolving.  He was slow to recover and seemed confused.  Currently, he feels that he is at his neurological baseline.  No headaches.  No weakness.  No nausea or vomiting.     Review of Systems:  A 10 point review of systems is negative, except for the pertinent positives and negatives detailed in the HPI.  Past Medical History: Past Medical History:  Diagnosis Date   Diabetes mellitus without complication ([place])    Hepatitis    High cholesterol    Hypertension    Stage 3b chronic kidney disease (CKD) ([place])     Past Surgical History: Past Surgical History:  Procedure Laterality Date   TONSILLECTOMY      Problem List: Patient Active Problem List   Diagnosis Date Noted   Epidural hematoma    Seizure ([place]) 03/24/2021    Allergies: Allergies as of 03/24/2021   (No Known Allergies)    Medications: @ENCMED @  Social History: Social History   Tobacco Use   Smoking status: Every Day    Packs/day: 0.50    Years: 39.00    Pack years: 19.50    Types: Cigarettes   Smokeless tobacco: Never  Substance Use Topics   Alcohol use: Yes    Comment: sometimes 1-2 drinks   Drug use: No    Family Medical History: History reviewed. No pertinent family history.  Physical Examination: @VITALWITHPAIN @  General: Patient is well developed, well  nourished, calm, collected, and in no apparent distress.  Psychiatric: Patient is non-anxious.  Head:  Pupils equal, round, and reactive to light.  ENT:  Oral mucosa appears well hydrated.  Neck:   Supple.  Full range of motion.  Respiratory: Patient is breathing without any difficulty.  Extremities: No edema.  Vascular: Palpable pulses.  Skin:   On exposed skin, there are no abnormal skin lesions.  NEUROLOGICAL:  General: In no acute distress.   Awake, alert, oriented to person, place, and time.  Pupils equal round and reactive to light.  Facial tone is symmetric.  Tongue protrusion is midline.  There is no pronator drift.    Strength: Side Biceps Triceps Deltoid Interossei Grip Wrist Ext. Wrist Flex.  R 5 5 5 5 5 5 5   L 5 5 5 5 5 5 5    Side Iliopsoas Quads Hamstring PF DF EHL  R 5 5 5 5 5 5   L 5 5 5 5 5 5    Reflexes are 2+ and symmetric at the biceps, triceps, brachioradialis, patella and achilles.   Bilateral upper and lower extremity sensation is intact to light touch and pin prick.  Clonus is not present.  Toes are down-going.  Gait is normal.  No difficulty with tandem gait.  Hoffman's is absent.  Imaging: EXAM: CT HEAD WITHOUT CONTRAST   TECHNIQUE: Contiguous axial images were obtained from the base of  the skull through the vertex without intravenous contrast.   COMPARISON:  03/24/2021 at 9:08 p.m.   FINDINGS: Brain: Extra-axial hemorrhagic fluid collection along the right cerebral convexity demonstrating a slightly lenticular configuration suggestive of a an epidural hematoma appears stable measuring 0.8 x 4.9 x 5.5 cm when measured in similar fashion. Mild associated mass effect upon the right cerebral hemisphere is unchanged. Stable left temporal extra-axial hemorrhagic fluid collection measuring 4 mm in thickness. No significant associated mass effect with this collection.   No interval hemorrhage. No significant midline shift. Ventricular size is  normal. No acute infarct. Remote lacunar infarct within the right pons again noted. Cerebellum unremarkable.   Vascular: No hyperdense vessel or unexpected calcification.   Skull: Normal. Negative for fracture or focal lesion.   Sinuses/Orbits: No acute finding.   Other: Mastoid air cells and middle ear cavities are clear.   IMPRESSION: Stable right frontotemporal epidural hematoma with associated mild mass effect upon the right cerebral hemisphere.   Stable small left probable subdural hematoma without significant associated mass effect.   No interval hemorrhage.    I have personally reviewed the images and agree with the above interpretation.  Assessment and Plan: [name] is a pleasant 60 y.o. male with possible seizure and imaging concerning for right epidural hematoma and left subdural hematoma.    I have discussed the condition with the patient, including showing the radiographs and discussing treatment options in layman's terms.  Thus, I have recommended the following:   1)  No acute surgical interventions warranted. 2) Would recommend SBP <160 3)  Kepppra 500 mg bid    I will see the patient back in a few weeks to gauge progress.  Thank you for involving me in the care of this patient. I will keep you apprised of the patient's progress.   This note was partially dictated using voice recognition software, so please excuse any errors that were not corrected.  Redge Gainer MD, PhD

## 2021-03-26 ENCOUNTER — Inpatient Hospital Stay: Payer: Self-pay

## 2021-03-26 LAB — THYROID PANEL WITH TSH
Free Thyroxine Index: 2 (ref 1.2–4.9)
T3 Uptake Ratio: 30 % (ref 24–39)
T4, Total: 6.8 ug/dL (ref 4.5–12.0)
TSH: 0.687 u[IU]/mL (ref 0.450–4.500)

## 2021-03-26 LAB — BASIC METABOLIC PANEL
Anion gap: 8 (ref 5–15)
Anion gap: 9 (ref 5–15)
Anion gap: 9 (ref 5–15)
BUN: 13 mg/dL (ref 6–20)
BUN: 16 mg/dL (ref 6–20)
BUN: 16 mg/dL (ref 6–20)
CO2: 27 mmol/L (ref 22–32)
CO2: 27 mmol/L (ref 22–32)
CO2: 28 mmol/L (ref 22–32)
Calcium: 6.7 mg/dL — ABNORMAL LOW (ref 8.9–10.3)
Calcium: 7 mg/dL — ABNORMAL LOW (ref 8.9–10.3)
Calcium: 7.2 mg/dL — ABNORMAL LOW (ref 8.9–10.3)
Chloride: 103 mmol/L (ref 98–111)
Chloride: 98 mmol/L (ref 98–111)
Chloride: 99 mmol/L (ref 98–111)
Creatinine, Ser: 2.08 mg/dL — ABNORMAL HIGH (ref 0.61–1.24)
Creatinine, Ser: 2.19 mg/dL — ABNORMAL HIGH (ref 0.61–1.24)
Creatinine, Ser: 2.23 mg/dL — ABNORMAL HIGH (ref 0.61–1.24)
GFR, Estimated: 33 mL/min — ABNORMAL LOW (ref >60)
GFR, Estimated: 34 mL/min — ABNORMAL LOW (ref >60)
GFR, Estimated: 36 mL/min — ABNORMAL LOW (ref >60)
Glucose, Bld: 259 mg/dL — ABNORMAL HIGH (ref 70–99)
Glucose, Bld: 317 mg/dL — ABNORMAL HIGH (ref 70–99)
Glucose, Bld: 364 mg/dL — ABNORMAL HIGH (ref 70–99)
Potassium: 2.6 mmol/L — CL (ref 3.5–5.1)
Potassium: 2.8 mmol/L — ABNORMAL LOW (ref 3.5–5.1)
Potassium: 3.4 mmol/L — ABNORMAL LOW (ref 3.5–5.1)
Sodium: 135 mmol/L (ref 135–145)
Sodium: 135 mmol/L (ref 135–145)
Sodium: 138 mmol/L (ref 135–145)

## 2021-03-26 LAB — COMPREHENSIVE METABOLIC PANEL
ALT: 12 U/L (ref 0–44)
AST: 38 U/L (ref 15–41)
Albumin: 2.5 g/dL — ABNORMAL LOW (ref 3.5–5.0)
Alkaline Phosphatase: 64 U/L (ref 38–126)
Anion gap: 9 (ref 5–15)
BUN: 15 mg/dL (ref 6–20)
CO2: 27 mmol/L (ref 22–32)
Calcium: 6.7 mg/dL — ABNORMAL LOW (ref 8.9–10.3)
Chloride: 102 mmol/L (ref 98–111)
Creatinine, Ser: 2.19 mg/dL — ABNORMAL HIGH (ref 0.61–1.24)
GFR, Estimated: 34 mL/min — ABNORMAL LOW (ref >60)
Glucose, Bld: 296 mg/dL — ABNORMAL HIGH (ref 70–99)
Potassium: 3.1 mmol/L — ABNORMAL LOW (ref 3.5–5.1)
Sodium: 138 mmol/L (ref 135–145)
Total Bilirubin: 0.5 mg/dL (ref 0.3–1.2)
Total Protein: 5.8 g/dL — ABNORMAL LOW (ref 6.5–8.1)

## 2021-03-26 LAB — GLUCOSE, CAPILLARY
Glucose-Capillary: 232 mg/dL — ABNORMAL HIGH (ref 70–99)
Glucose-Capillary: 249 mg/dL — ABNORMAL HIGH (ref 70–99)
Glucose-Capillary: 268 mg/dL — ABNORMAL HIGH (ref 70–99)
Glucose-Capillary: 297 mg/dL — ABNORMAL HIGH (ref 70–99)
Glucose-Capillary: 346 mg/dL — ABNORMAL HIGH (ref 70–99)

## 2021-03-26 LAB — CBC
HCT: 38.3 % — ABNORMAL LOW (ref 39.0–52.0)
Hemoglobin: 13.6 g/dL (ref 13.0–17.0)
MCH: 28.9 pg (ref 26.0–34.0)
MCHC: 35.5 g/dL (ref 30.0–36.0)
MCV: 81.3 fL (ref 80.0–100.0)
Platelets: 228 10*3/uL (ref 150–400)
RBC: 4.71 MIL/uL (ref 4.22–5.81)
RDW: 13.3 % (ref 11.5–15.5)
WBC: 9.9 10*3/uL (ref 4.0–10.5)
nRBC: 0 % (ref 0.0–0.2)

## 2021-03-26 LAB — PHOSPHORUS
Phosphorus: 3.3 mg/dL (ref 2.5–4.6)
Phosphorus: 2.6 mg/dL (ref 2.5–4.6)

## 2021-03-26 LAB — MAGNESIUM
Magnesium: 2.1 mg/dL (ref 1.7–2.4)
Magnesium: 1.7 mg/dL (ref 1.7–2.4)

## 2021-03-26 IMAGING — MR MR HEAD W/O CM
10 of 14 series · 31 of 48 positions shown · non-contrast
Comparison: Head CT from [DATE].

CLINICAL DATA: Follow-up examination for intracranial hemorrhage,
headache. Seizure.

EXAM:
MRI HEAD WITHOUT CONTRAST
TECHNIQUE: Multiplanar, multiecho pulse sequences of the brain and surrounding
structures were obtained without intravenous contrast.

[Series 5: ax dwi_tracew · axial · 3.0mm · 0.65mm/px · z∈[-108,+29]mm · 4 of 44 slices shown]
[im 1/44]
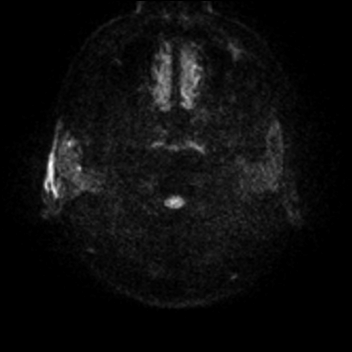
[im 15/44]
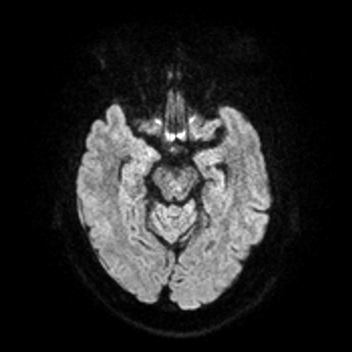
[im 29/44]
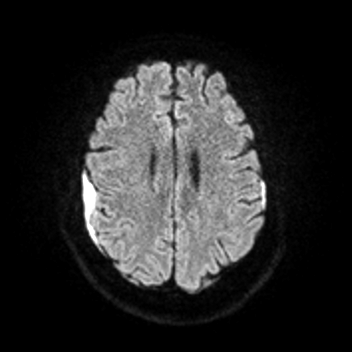
[im 44/44]
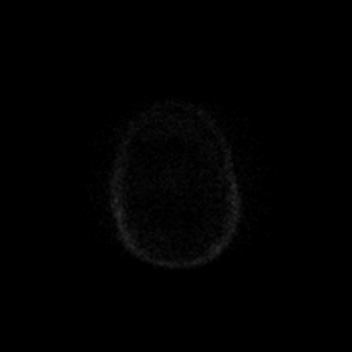

[Series 6: ax dwi_adc · axial · 3.0mm · 0.65mm/px · z∈[-108,+29]mm · 3 of 44 slices shown]
[im 1/44]
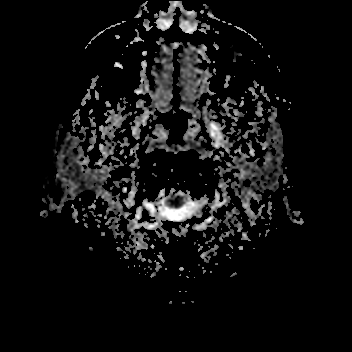
[im 22/44]
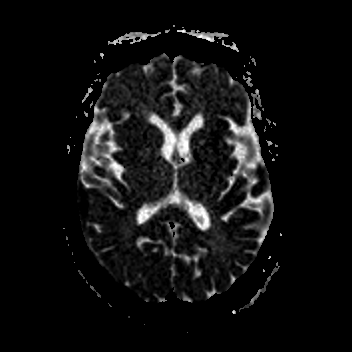
[im 44/44]
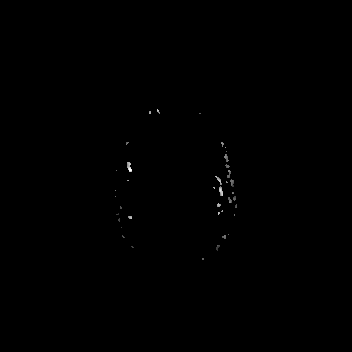

[Series 7: cor dwi_tracew · coronal · 5.0mm · 0.60mm/px · 2 of 36 slices shown]
[im 1/36]
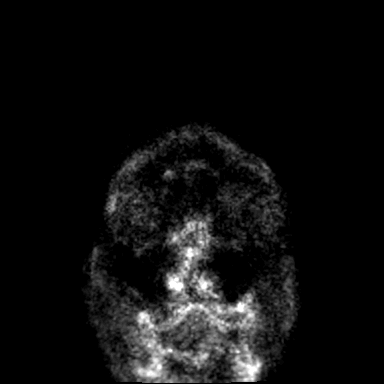
[im 36/36]
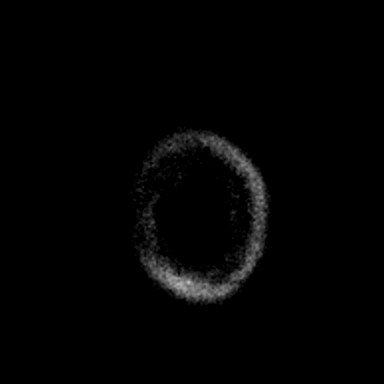

[Series 9: T1 · sagittal · 5.0mm · 0.62mm/px · 2 of 22 slices shown (1 of 2)]
[im 1/22]
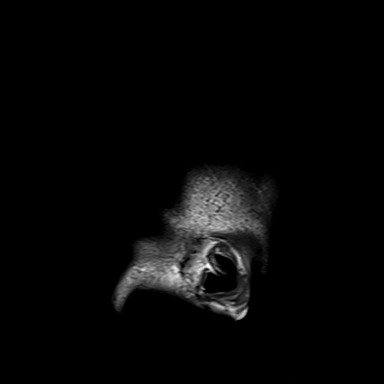
[im 22/22]
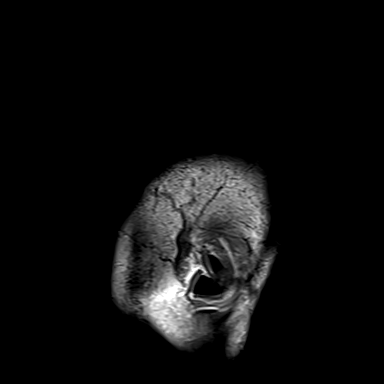

[Series 10: T2 · axial · 5.0mm · 0.53mm/px · z∈[-110,+36]mm · 2 of 26 slices shown (1 of 3)]
[im 1/26]
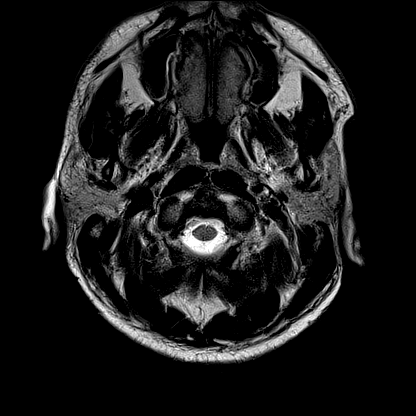
[im 26/26]
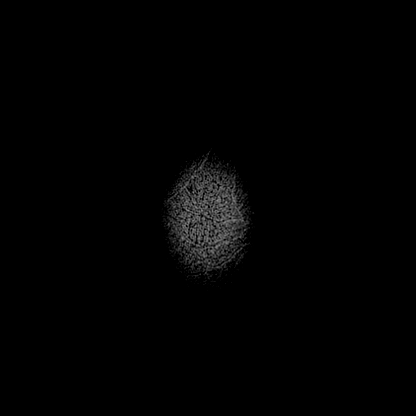

[Series 15: FLAIR · axial · 3.0mm · 0.53mm/px · z∈[-116,+41]mm · 4 of 55 slices shown (1 of 2)]
[im 1/55]
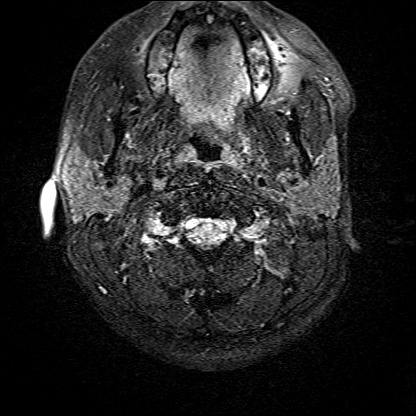
[im 19/55]
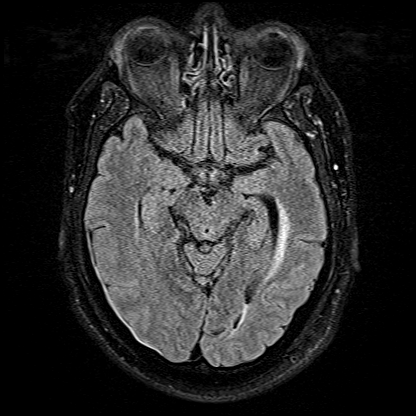
[im 37/55]
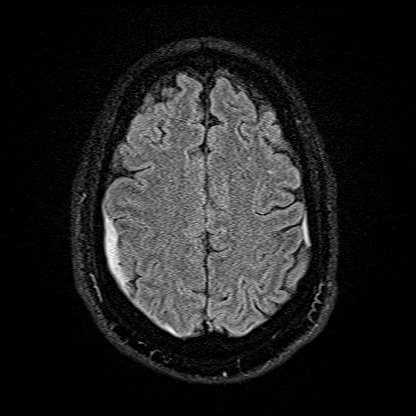
[im 55/55]
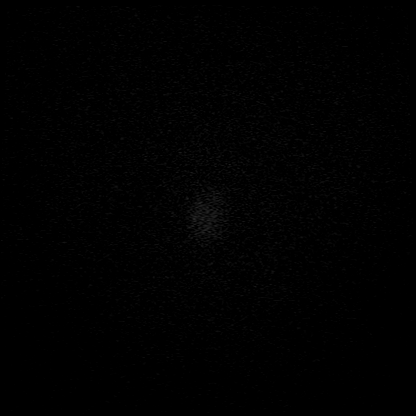

[Series 16: T1 · axial · 1.0mm · 0.98mm/px · z∈[-117,+37]mm · 8 of 160 slices shown (2 of 2)]
[im 1/160]
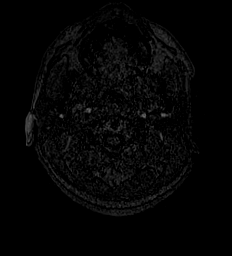
[im 32/160]
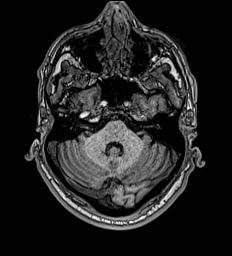
[im 48/160]
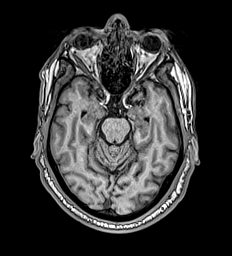
[im 64/160]
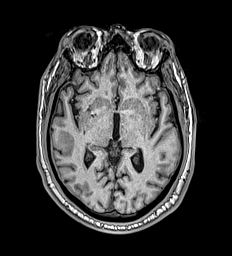
[im 96/160]
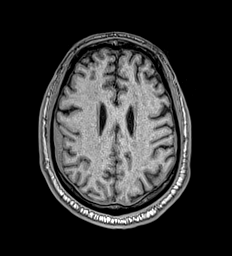
[im 112/160]
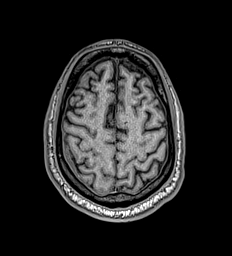
[im 128/160]
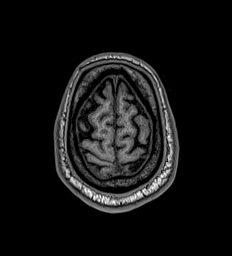
[im 160/160]
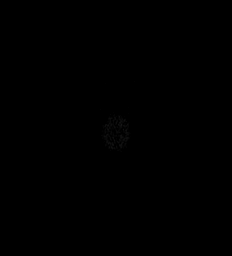

[Series 17: T2 · coronal · 5.0mm · 0.57mm/px · 2 of 28 slices shown (2 of 3)]
[im 1/28]
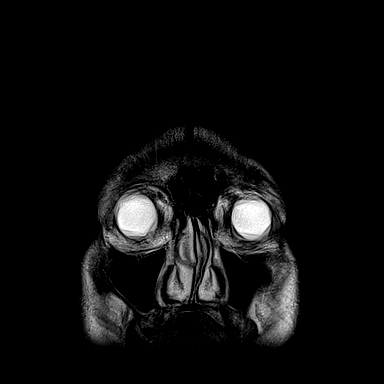
[im 28/28]
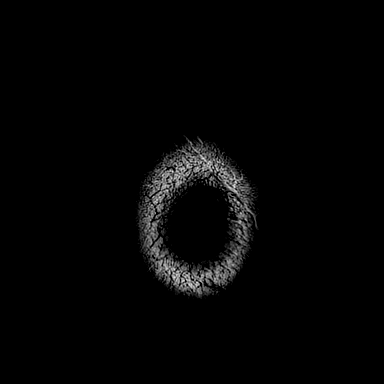

[Series 18: T2 · coronal · 3.0mm · 0.26mm/px · 2 of 35 slices shown (3 of 3)]
[im 1/35]
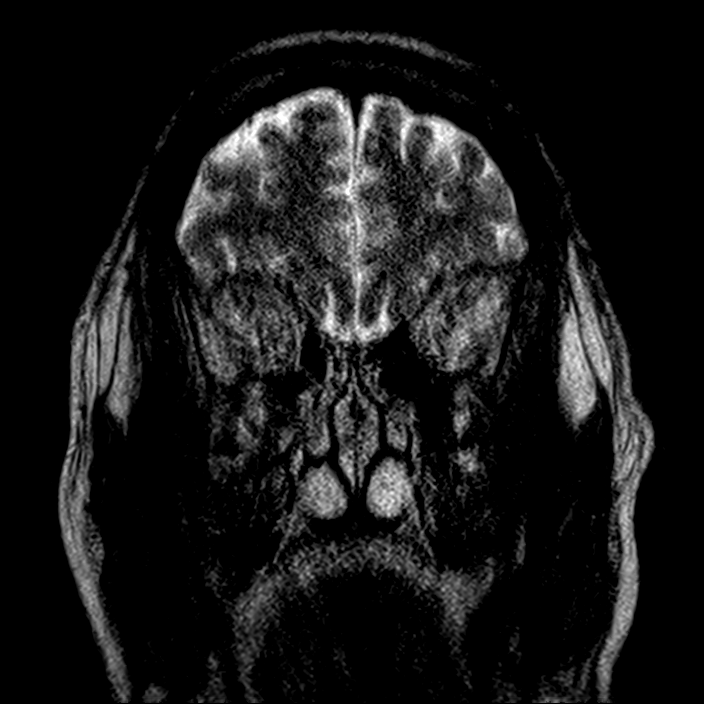
[im 35/35]
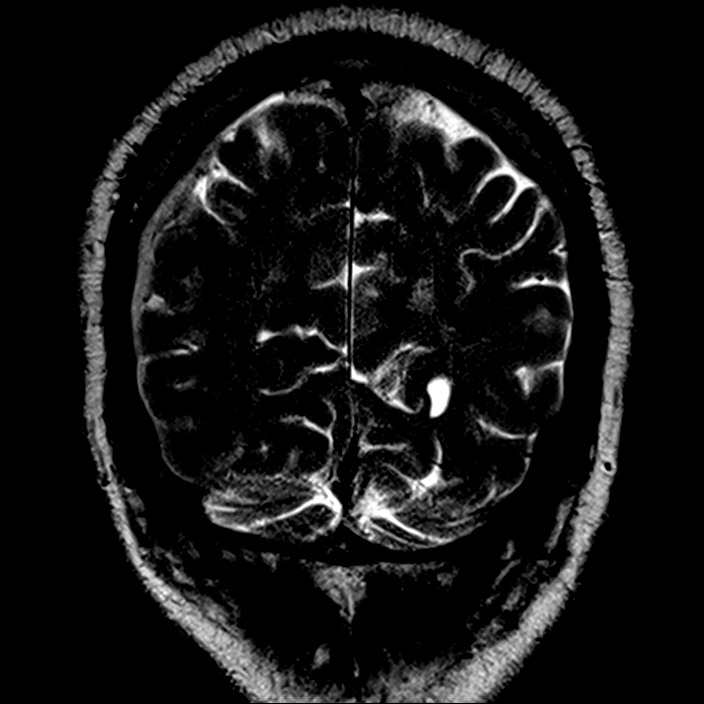

[Series 19: FLAIR · coronal · 3.0mm · 0.35mm/px · 2 of 35 slices shown (2 of 2)]
[im 1/35]
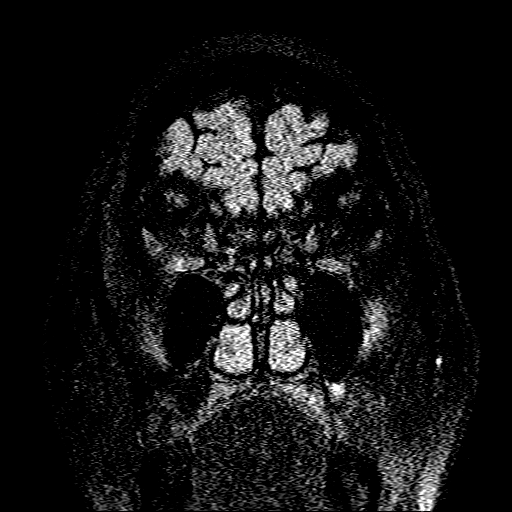
[im 35/35]
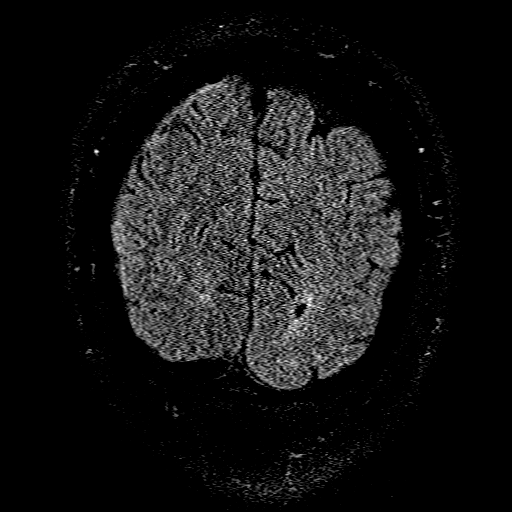

[31 of 48 positions shown; findings below may reference images not displayed]

FINDINGS: Brain: Cerebral volume within normal limits. Mild chronic
microvascular ischemic disease noted involving the periventricular
and deep white matter both cerebral hemispheres as well as the pons.
Small remote lacunar infarcts present at the left frontal corona
radiata and right pons.

Punctate 3 mm focus of restricted diffusion seen involving the right
basal ganglia, consistent with an acute ischemic infarct (series 5,
image 24). No associated hemorrhage or mass effect. No other
evidence for acute or subacute ischemia. Gray-white matter
differentiation otherwise maintained.

Previously identified bilateral extra-axial hemorrhages again seen.
Hemorrhage overlying the right cerebral convexity measures up to 7-8
mm in thickness, while the hemorrhage overlying the left cerebral
convexity measures 2-3 mm in thickness. These are relatively stable
in size from prior CT. The right-sided collection demonstrates mild
mass effect on the subjacent right cerebral hemisphere without
significant midline shift. No hydrocephalus or trapping. Basilar
cisterns remain patent.

No mass lesion or mass effect elsewhere within the brain. Pituitary
gland suprasellar region normal. Midline structures intact and
normal.

Vascular: Major intracranial vascular flow voids are maintained.

Skull and upper cervical spine: Craniocervical junction within
normal limits. Bone marrow signal intensity within normal limits. No
scalp soft tissue abnormality.

Sinuses/Orbits: Globes orbital soft tissues demonstrate no acute
finding. Paranasal sinuses are largely clear. No significant mastoid
effusion. Inner ear structures grossly normal.

Other: None.
IMPRESSION: 1. 3 mm acute ischemic nonhemorrhagic infarct involving the right
basal ganglia.
2. Stable bilateral extra-axial hemorrhages, measuring up to 7-8 mm
on the right and 2-3 mm in thickness on the left. No significant
midline shift.
3. Underlying mild chronic microvascular ischemic disease with small
remote lacunar infarcts involving the left frontal corona radiata
and right pons.

## 2021-03-26 IMAGING — MR MR MRA NECK W/O CM
1 series · 37 of 48 positions shown · non-contrast
Comparison: Prior brain MRI from earlier the same day.

CLINICAL DATA: Initial evaluation for neuro deficit, stroke
suspected.

EXAM:
MRA NECK WITHOUT CONTRAST
MRA HEAD WITHOUT CONTRAST
TECHNIQUE: Multiplanar and multiecho pulse sequences of the neck were obtained
without intravenous contrast. Angiographic images of the neck were
obtained using MRA technique without and with intravenous contrast;
Angiographic images of the Circle of Willis were obtained using MRA
technique without intravenous contrast.

[Series 10: TOF · axial · 0.6mm · 0.52mm/px · z∈[-198,-122]mm · 37 of 133 slices shown]
[im 1/133]
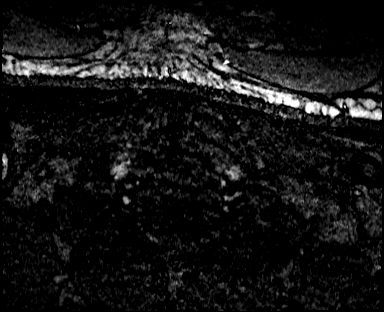
[im 3/133]
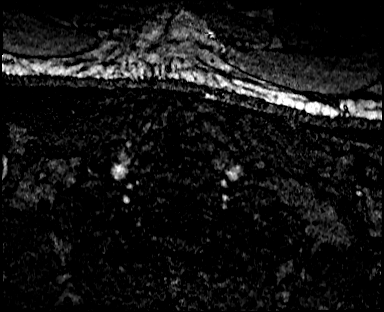
[im 6/133]
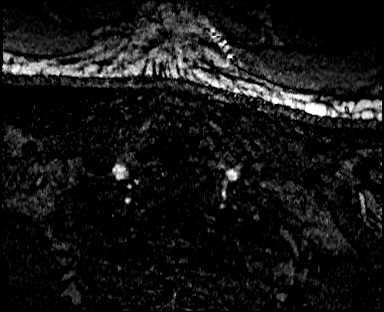
[im 9/133]
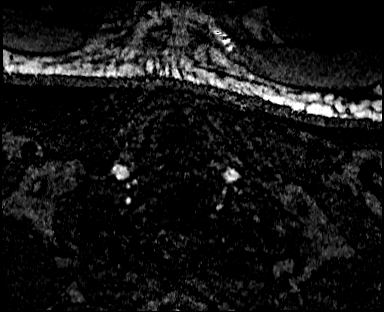
[im 12/133]
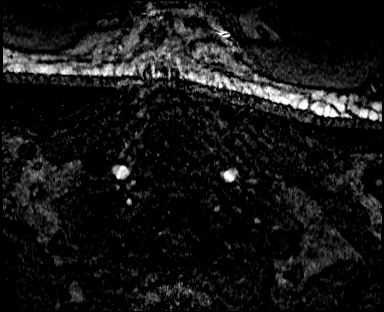
[im 15/133]
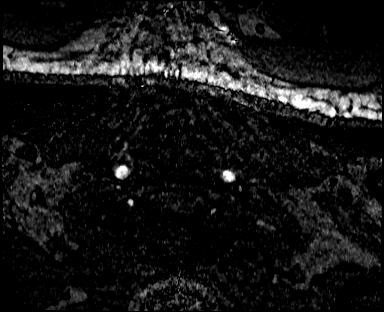
[im 17/133]
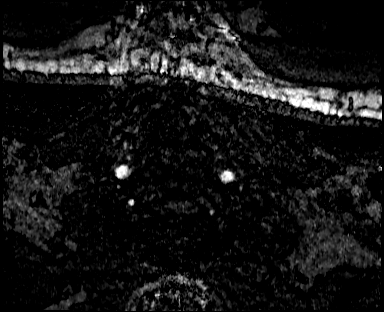
[im 20/133]
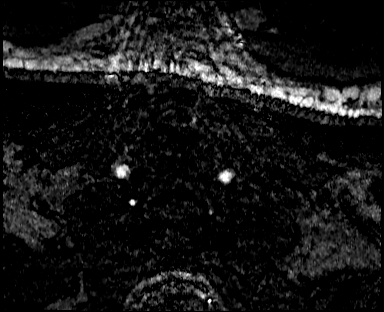
[im 23/133]
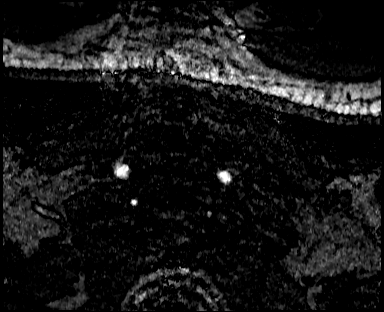
[im 26/133]
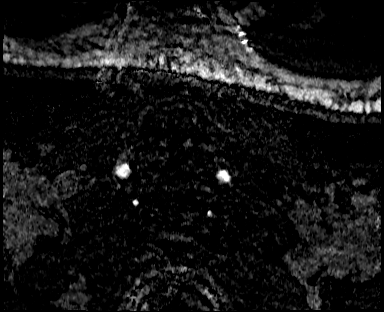
[im 29/133]
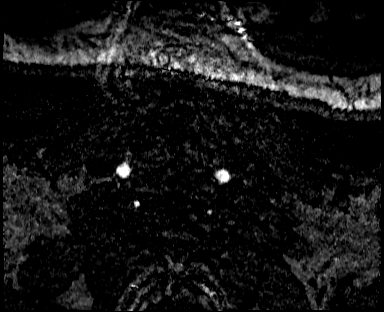
[im 31/133]
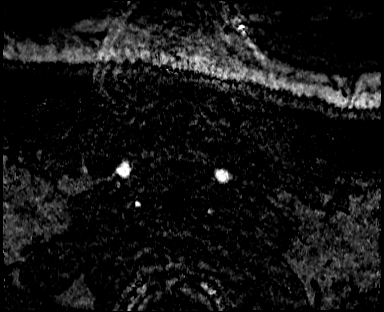
[im 34/133]
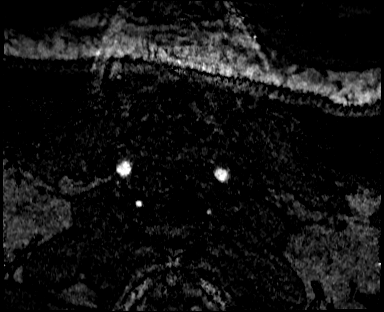
[im 37/133]
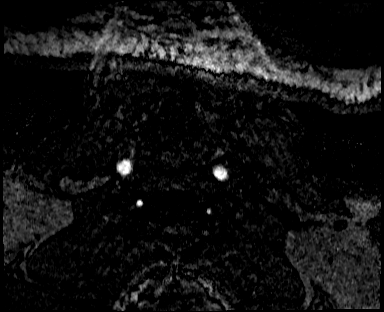
[im 40/133]
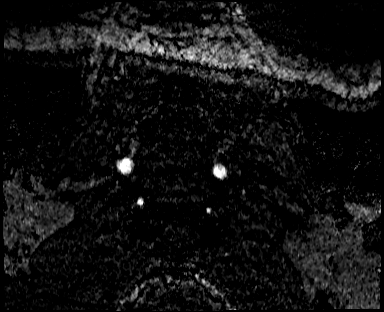
[im 43/133]
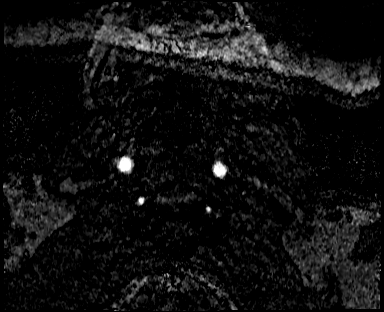
[im 45/133]
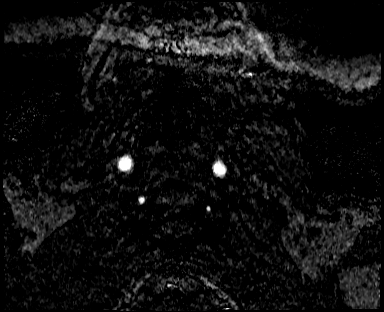
[im 48/133]
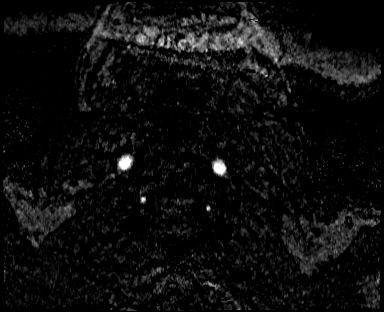
[im 51/133]
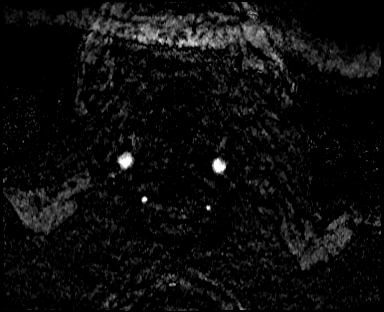
[im 54/133]
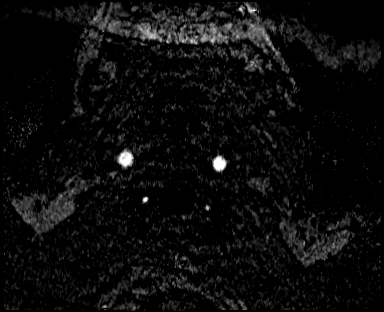
[im 57/133]
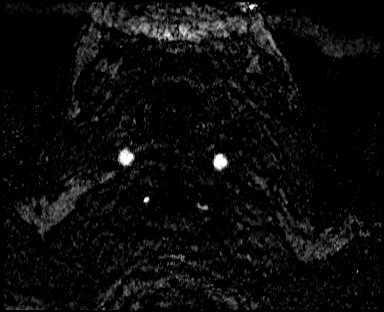
[im 59/133]
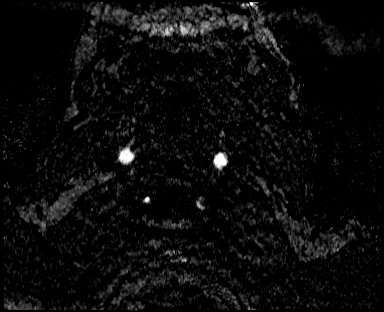
[im 62/133]
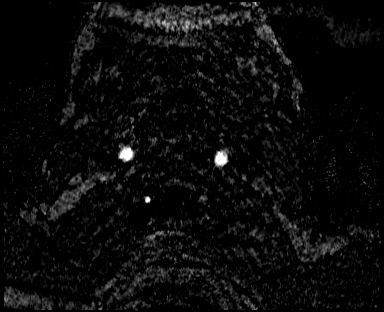
[im 65/133]
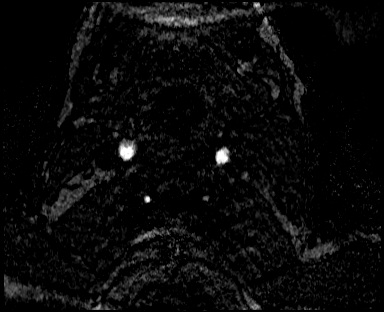
[im 68/133]
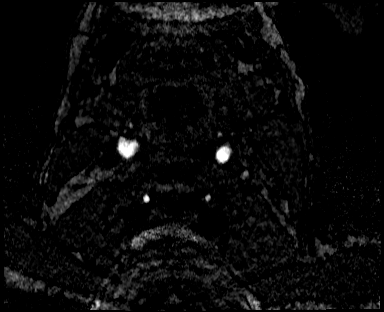
[im 71/133]
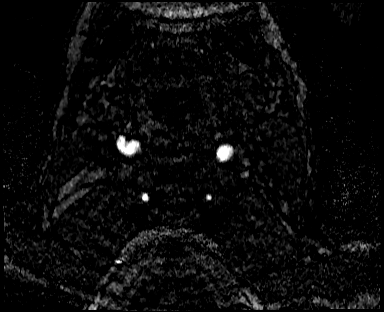
[im 74/133]
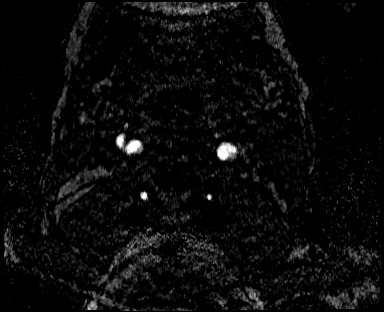
[im 76/133]
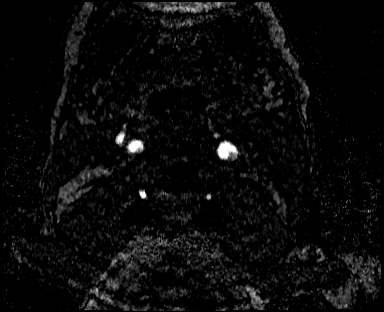
[im 79/133]
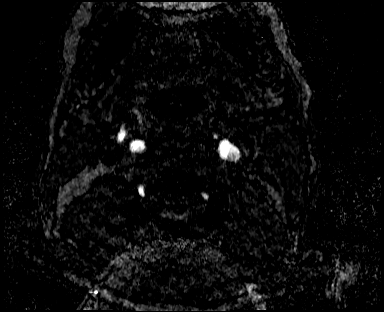
[im 82/133]
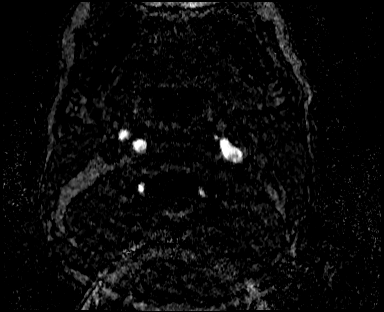
[im 85/133]
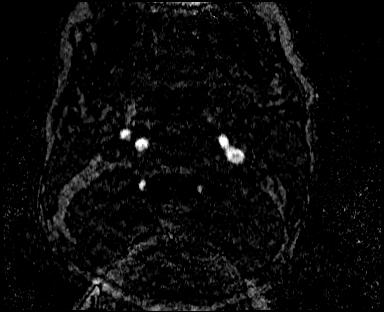
[im 88/133]
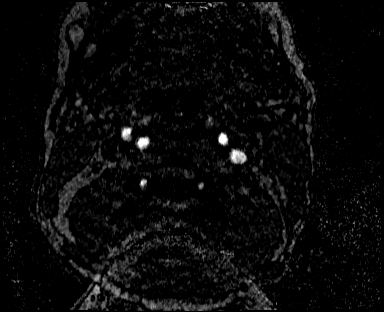
[im 90/133]
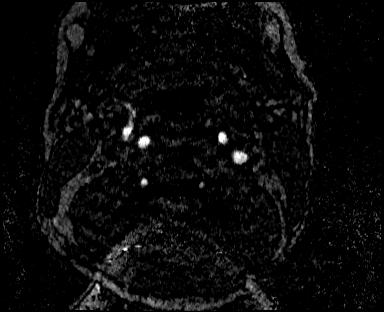
[im 93/133]
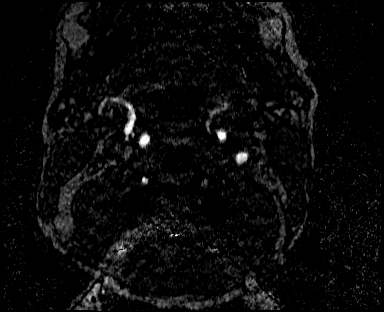
[im 110/133]
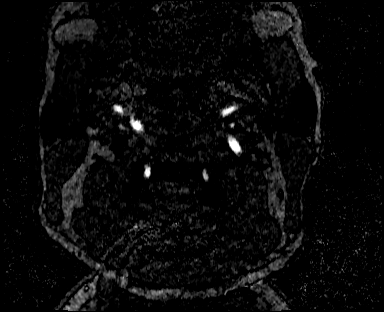
[im 113/133]
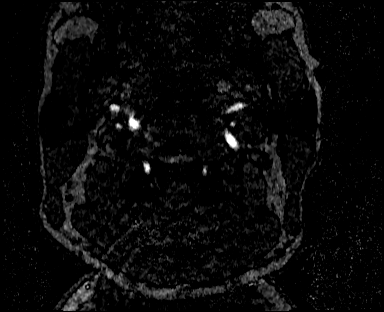
[im 127/133]
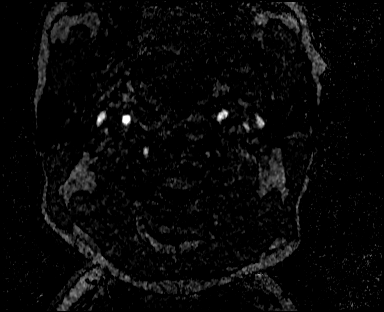

[37 of 48 positions shown; findings below may reference images not displayed]

FINDINGS: MRA NECK FINDINGS

AORTIC ARCH: Examination somewhat technically limited by lack of IV
contrast.

Aortic arch and origin of the great vessels not assessed on this
examination.

RIGHT CAROTID SYSTEM: Visualized mid-distal right CCA widely patent
with antegrade flow. No significant atheromatous narrowing about the
right carotid bifurcation. Right ICA patent distally without
stenosis, evidence for dissection or occlusion.

LEFT CAROTID SYSTEM: Visualized left CCA patent to the bifurcation
without stenosis. No significant atheromatous narrowing about the
left bifurcation. Left ICA patent distally without stenosis,
dissection or occlusion.

VERTEBRAL ARTERIES: Neither vertebral artery origin visualized on
this exam. Right vertebral artery slightly dominant. Visualized
vertebral arteries patent with antegrade flow without stenosis,
evidence for dissection or occlusion. Right vertebral artery
dominant.

MRA HEAD FINDINGS

ANTERIOR CIRCULATION:

Examination mildly degraded by motion artifact.

Visualized distal cervical segments of the internal carotid arteries
are patent with antegrade flow. Petrous, cavernous, and supraclinoid
segments patent without stenosis or other abnormality. A1 segments
widely patent. Normal anterior communicating artery complex.
Anterior cerebral arteries patent to their distal aspects without
stenosis. No M1 stenosis or occlusion. Normal MCA bifurcations.
Distal MCA branches perfused and symmetric.

POSTERIOR CIRCULATION:

Right vertebral artery dominant and patent to the vertebrobasilar
junction without stenosis. Right PICA origin patent. Diminutive left
vertebral artery largely terminates in PICA, although a small branch
a sense or the vertebrobasilar junction. Left PICA patent as well.
Basilar somewhat diminutive. Apparent mild narrowing about the mid
basilar artery favored to be artifactual in nature. Basilar
otherwise patent to its distal aspect. Superior cerebellar arteries
patent bilaterally. Predominant fetal type origin of the PCAs, both
of which are well perfused to their distal aspects without stenosis.

No intracranial aneurysm or other vascular abnormality.
IMPRESSION: 1. Negative MRA of the neck. No hemodynamically significant stenosis
or other acute vascular abnormality.
2. Negative intracranial MRA. No large vessel occlusion. No
hemodynamically significant stenosis.
3. Predominant fetal type origin of the PCAs with overall diminutive
vertebrobasilar system.

## 2021-03-26 MED ORDER — INSULIN ASPART 100 UNIT/ML IJ SOLN
0.0000 [IU] | Freq: Three times a day (TID) | INTRAMUSCULAR | Status: DC
  Administered 2021-03-26: 15 [IU] via SUBCUTANEOUS
  Administered 2021-03-26 (×2): 7 [IU] via SUBCUTANEOUS
  Administered 2021-03-26: 11 [IU] via SUBCUTANEOUS
  Administered 2021-03-27: 7 [IU] via SUBCUTANEOUS
  Filled 2021-03-26 (×5): qty 1

## 2021-03-26 MED ORDER — POTASSIUM CHLORIDE CRYS ER 20 MEQ PO TBCR
40.0000 meq | EXTENDED_RELEASE_TABLET | Freq: Once | ORAL | Status: AC
  Administered 2021-03-26: 40 meq via ORAL
  Filled 2021-03-26: qty 2

## 2021-03-26 MED ORDER — ATORVASTATIN CALCIUM 20 MG PO TABS
20.0000 mg | ORAL_TABLET | Freq: Every day | ORAL | Status: DC
  Administered 2021-03-26: 20 mg via ORAL
  Filled 2021-03-26: qty 1

## 2021-03-26 MED ORDER — POTASSIUM CHLORIDE 10 MEQ/100ML IV SOLN
10.0000 meq | INTRAVENOUS | Status: AC
  Administered 2021-03-27 (×4): 10 meq via INTRAVENOUS
  Filled 2021-03-26 (×4): qty 100

## 2021-03-26 MED ORDER — LABETALOL HCL 5 MG/ML IV SOLN
20.0000 mg | INTRAVENOUS | Status: DC | PRN
  Administered 2021-03-27 (×2): 20 mg via INTRAVENOUS
  Filled 2021-03-26 (×2): qty 4

## 2021-03-26 MED ORDER — INSULIN ASPART 100 UNIT/ML IJ SOLN
8.0000 [IU] | Freq: Three times a day (TID) | INTRAMUSCULAR | Status: DC
  Administered 2021-03-27 – 2021-03-29 (×8): 8 [IU] via SUBCUTANEOUS
  Filled 2021-03-26 (×8): qty 1

## 2021-03-26 MED ORDER — POTASSIUM CHLORIDE 10 MEQ/50ML IV SOLN
10.0000 meq | INTRAVENOUS | Status: AC
Start: 2021-03-26 — End: 2021-03-27
  Administered 2021-03-26 (×3): 10 meq via INTRAVENOUS
  Filled 2021-03-26 (×3): qty 50

## 2021-03-26 MED ORDER — POTASSIUM CHLORIDE CRYS ER 20 MEQ PO TBCR
40.0000 meq | EXTENDED_RELEASE_TABLET | Freq: Once | ORAL | Status: AC
  Administered 2021-03-27: 40 meq via ORAL
  Filled 2021-03-26: qty 2

## 2021-03-26 MED ORDER — ATORVASTATIN CALCIUM 20 MG PO TABS
80.0000 mg | ORAL_TABLET | Freq: Every day | ORAL | Status: DC
  Administered 2021-03-27 – 2021-03-30 (×4): 80 mg via ORAL
  Filled 2021-03-26 (×4): qty 4

## 2021-03-26 MED ORDER — HYDRALAZINE HCL 20 MG/ML IJ SOLN
10.0000 mg | INTRAMUSCULAR | Status: DC | PRN
  Administered 2021-03-26: 10 mg via INTRAVENOUS
  Administered 2021-03-26: 20 mg via INTRAVENOUS
  Administered 2021-03-27: 10 mg via INTRAVENOUS
  Administered 2021-03-27: 20 mg via INTRAVENOUS
  Administered 2021-03-28: 10 mg via INTRAVENOUS
  Filled 2021-03-26 (×5): qty 1

## 2021-03-26 MED ORDER — POTASSIUM CHLORIDE 10 MEQ/50ML IV SOLN: 10.0000 meq | INTRAVENOUS | Status: DC

## 2021-03-26 MED ORDER — INFLUENZA VAC SPLIT QUAD 0.5 ML IM SUSY
0.5000 mL | PREFILLED_SYRINGE | INTRAMUSCULAR | Status: AC
  Administered 2021-03-28: 0.5 mL via INTRAMUSCULAR
  Filled 2021-03-26: qty 0.5

## 2021-03-26 MED ORDER — POTASSIUM CHLORIDE 10 MEQ/50ML IV SOLN
10.0000 meq | INTRAVENOUS | Status: AC
  Administered 2021-03-26 (×4): 10 meq via INTRAVENOUS
  Filled 2021-03-26 (×4): qty 50

## 2021-03-26 MED ORDER — LABETALOL HCL 5 MG/ML IV SOLN
20.0000 mg | INTRAVENOUS | Status: DC | PRN
  Administered 2021-03-26: 20 mg via INTRAVENOUS
  Filled 2021-03-26: qty 4

## 2021-03-26 NOTE — Progress Notes (Addendum)
PROGRESS NOTE    Chase Rodriguez  IOE:[id number] DOB: 1961-04-07 DOA: 03/24/2021 PCP: [facility] [place]   Brief Narrative:  60 y.o. BM PMHx DM type II uncontrolled with complication, DM nephropathy, CKD stage IIIb, essential HTN, HLD, Hepatitis  Presents with the chief complaint of yncopal episode/seizure.    According to girlfriend, patent sitting on couch and eating food and then slumped over and had jerking of his right arm and incontinence.  He fell to the ground striking the right side of his head on the carpeted floor.  She reported possibly as much as 30 minutes of this right arm seizure activity & unresponsive state prior to self resolving.  He was slow to recover and seemed confused.  Currently, he feels that he is at his neurological baseline.  No headaches.  No weakness.  No nausea or vomiting.    Subjective: A/O x4, negative SOB, negative CP.  Negative headache, negative neurological changes.   Assessment & Plan:  Covid vaccination;  Active Problems:   Seizure (HCC)   Epidural hematoma   LEFT subdural hematoma -Neurochecks q 4 hours - MRI/MRA brain pending -SBP goal<150  Right epidural hematoma -See subdural hematoma and essential HTN  Witnessed Seizure -EEG scheduled for the A.m. -Keppra 500 mg BID  DM type II uncontrolled with Hyperglycemia - 10/29 Hemoglobin A1c= 12.5 -Levemir 15 units BID -NovoLog 8 units qac -Resistant SSI  DM nephropathy/CKD stage IIIb Lab Results  Component Value Date   CREATININE 2.19 (H) 03/26/2021   CREATININE 2.19 (H) 03/26/2021   CREATININE 2.23 (H) 03/26/2021   CREATININE 2.02 (H) 03/25/2021   CREATININE 2.06 (H) 03/25/2021    Essential HTN -Amlodipine 10 mg daily - Hydralazine IV PRN - Labetalol IV PRN -Labetalol 100 mg TID -Lisinopril 40 mg daily  HLD -10/29 LDL= 170: Goal<70 mg/deciliter -Lipitor 80 mg daily  Chronic Hepatitis C without Hepatic Coma. -Unsure if patient received treatment will  address in a.m. obviously would not acutely receive treatment now.  Hypokalemia -Potassium goal> 4  Hypocalcemia   Obese     Body mass index is 32.32 kg/m.     DVT prophylaxis: SCD Code Status: Full Family Communication: 10/30 brother and girlfriend at bedside for discussion of plan of care all questions answered Status is: Inpatient    Dispo: The patient is from: Home              Anticipated d/c is to: Home              Anticipated d/c date is: 3 days              Patient currently is not medically stable to d/c.      Consultants:  Neurosurgery  Neurology PCCM  Procedures/Significant Events:     I have personally reviewed and interpreted all radiology studies and my findings are as above.  VENTILATOR SETTINGS:    Cultures 10/28 SARS coronavirus negative 10/28 influenza A/B negative   Antimicrobials:    Devices    LINES / TUBES:      Continuous Infusions:  lactated ringers with kcl 75 mL/hr at 03/25/21 2230   levETIRAcetam 500 mg (03/25/21 2046)   potassium chloride 10 mEq (03/26/21 0840)     Objective: Vitals:   03/26/21 0630 03/26/21 0645 03/26/21 0700 03/26/21 0800  BP: (!) 148/86 139/78 (!) 150/84 (!) 159/93  Pulse: 96 96 92 100  Resp: 12 13 14 14   Temp:      TempSrc:  SpO2: 99% 98% 98% 99%  Weight:      Height:        Intake/Output Summary (Last 24 hours) at 03/26/2021 0847 Last data filed at 03/26/2021 0500 Gross per 24 hour  Intake 1704.73 ml  Output 3935 ml  Net -2230.27 ml   Filed Weights   03/24/21 2244 03/25/21 0500 03/26/21 0425  Weight: 99.2 kg 101 kg 105.1 kg    Examination:  General: No acute respiratory distress Eyes: negative scleral hemorrhage, negative anisocoria, negative icterus ENT: Negative Runny nose, negative gingival bleeding, Neck:  Negative scars, masses, torticollis, lymphadenopathy, JVD Lungs: Clear to auscultation bilaterally without wheezes or crackles Cardiovascular: Regular  rate and rhythm without murmur gallop or rub normal S1 and S2 Abdomen: negative abdominal pain, nondistended, positive soft, bowel sounds, no rebound, no ascites, no appreciable mass Extremities: No significant cyanosis, clubbing, or edema bilateral lower extremities Skin: Negative rashes, lesions, ulcers Psychiatric:  Negative depression, negative anxiety, negative fatigue, negative mania  Central nervous system:  Cranial nerves II through XII intact, tongue/uvula midline, all extremities muscle strength 5/5, negative dysarthria, negative expressive aphasia, negative receptive aphasia.  .     Data Reviewed: Care during the described time interval was provided by me .  I have reviewed this patient's available data, including medical history, events of note, physical examination, and all test results as part of my evaluation.   CBC: Recent Labs  Lab 03/24/21 1405 03/25/21 0441 03/26/21 0418  WBC 8.7 8.7 9.9  HGB 15.1 14.2 13.6  HCT 42.0 39.1 38.3*  MCV 81.1 80.0 81.3  PLT 249 240 884   Basic Metabolic Panel: Recent Labs  Lab 03/24/21 1405 03/25/21 0155 03/25/21 0441 03/25/21 1018 03/25/21 1402 03/25/21 1958 03/26/21 0007 03/26/21 0418  NA 127*   < >  --  135 137 137 135 135  K 2.3*   < >  --  2.2* 2.0* 2.5* 2.6* 3.4*  CL 88*   < >  --  96* 96* 98 98 99  CO2 28   < >  --  29 32 29 28 27   GLUCOSE 768*   < >  --  245* 227* 242* 317* 364*  BUN 23*   < >  --  16 17 14 16 16   CREATININE 2.48*   < >  --  2.03* 2.06* 2.02* 2.23* 2.19*  CALCIUM 8.3*   < >  --  7.6* 7.6* 7.3* 7.2* 7.0*  MG 1.4*  --  1.7  --  1.5* 1.6*  --  2.1  PHOS  --   --  2.1*  --   --  3.3  --  3.3   < > = values in this interval not displayed.   GFR: Estimated Creatinine Clearance: 45.3 mL/min (A) (by C-G formula based on SCr of 2.19 mg/dL (H)). Liver Function Tests: Recent Labs  Lab 03/24/21 2105  AST 21  ALT 12  ALKPHOS 65  BILITOT 0.5  PROT 7.4  ALBUMIN 3.1*   No results for input(s):  LIPASE, AMYLASE in the last 168 hours. No results for input(s): AMMONIA in the last 168 hours. Coagulation Profile: Recent Labs  Lab 03/24/21 2105  INR 1.0   Cardiac Enzymes: No results for input(s): CKTOTAL, CKMB, CKMBINDEX, TROPONINI in the last 168 hours. BNP (last 3 results) No results for input(s): PROBNP in the last 8760 hours. HbA1C: Recent Labs    03/25/21 0155  HGBA1C 12.5*   CBG: Recent Labs  Lab 03/25/21 1210  03/25/21 1546 03/25/21 2103 03/26/21 0024 03/26/21 0809  GLUCAP 224* 251* 314* 268* 346*   Lipid Profile: Recent Labs    03/25/21 0441  CHOL 264*  HDL 29*  LDLCALC 170*  TRIG 325*  CHOLHDL 9.1   Thyroid Function Tests: No results for input(s): TSH, T4TOTAL, FREET4, T3FREE, THYROIDAB in the last 72 hours. Anemia Panel: No results for input(s): VITAMINB12, FOLATE, FERRITIN, TIBC, IRON, RETICCTPCT in the last 72 hours. Urine analysis:    Component Value Date/Time   COLORURINE STRAW (A) 03/24/2021 2053   APPEARANCEUR CLEAR (A) 03/24/2021 2053   LABSPEC 1.016 03/24/2021 2053   PHURINE 6.0 03/24/2021 2053   GLUCOSEU >=500 (A) 03/24/2021 2053   HGBUR MODERATE (A) 03/24/2021 2053   BILIRUBINUR NEGATIVE 03/24/2021 2053   [place] NEGATIVE 03/24/2021 2053   PROTEINUR >=300 (A) 03/24/2021 2053   NITRITE NEGATIVE 03/24/2021 2053   LEUKOCYTESUR NEGATIVE 03/24/2021 2053   Sepsis Labs: @LABRCNTIP (procalcitonin:4,lacticidven:4)  ) Recent Results (from the past 240 hour(s))  Resp Panel by RT-PCR (Flu A&B, Covid) Nasopharyngeal Swab     Status: None   Collection Time: 03/24/21  9:37 PM   Specimen: Nasopharyngeal Swab; Nasopharyngeal(NP) swabs in vial transport medium  Result Value Ref Range Status   SARS Coronavirus 2 by RT PCR NEGATIVE NEGATIVE Final    Comment: (NOTE) SARS-CoV-2 target nucleic acids are NOT DETECTED.  The SARS-CoV-2 RNA is generally detectable in upper respiratory specimens during the acute phase of infection. The  lowest concentration of SARS-CoV-2 viral copies this assay can detect is 138 copies/mL. A negative result does not preclude SARS-Cov-2 infection and should not be used as the sole basis for treatment or other patient management decisions. A negative result may occur with  improper specimen collection/handling, submission of specimen other than nasopharyngeal swab, presence of viral mutation(s) within the areas targeted by this assay, and inadequate number of viral copies(<138 copies/mL). A negative result must be combined with clinical observations, patient history, and epidemiological information. The expected result is Negative.  Fact Sheet for Patients:  EntrepreneurPulse.com.au  Fact Sheet for Healthcare Providers:  IncredibleEmployment.be  This test is no t yet approved or cleared by the [place] FDA and  has been authorized for detection and/or diagnosis of SARS-CoV-2 by FDA under an Emergency Use Authorization (EUA). This EUA will remain  in effect (meaning this test can be used) for the duration of the COVID-19 declaration under Section 564(b)(1) of the Act, 21 U.S.C.section 360bbb-3(b)(1), unless the authorization is terminated  or revoked sooner.       Influenza A by PCR NEGATIVE NEGATIVE Final   Influenza B by PCR NEGATIVE NEGATIVE Final    Comment: (NOTE) The Xpert Xpress SARS-CoV-2/FLU/RSV plus assay is intended as an aid in the diagnosis of influenza from Nasopharyngeal swab specimens and should not be used as a sole basis for treatment. Nasal washings and aspirates are unacceptable for Xpert Xpress SARS-CoV-2/FLU/RSV testing.  Fact Sheet for Patients: EntrepreneurPulse.com.au  Fact Sheet for Healthcare Providers: IncredibleEmployment.be  This test is not yet approved or cleared by the [place] FDA and has been authorized for detection and/or diagnosis of SARS-CoV-2 by FDA under  an Emergency Use Authorization (EUA). This EUA will remain in effect (meaning this test can be used) for the duration of the COVID-19 declaration under Section 564(b)(1) of the Act, 21 U.S.C. section 360bbb-3(b)(1), unless the authorization is terminated or revoked.  Performed at [facility], [address], [place], [place] [address]   MRSA Next Gen  by PCR, Nasal     Status: None   Collection Time: 03/25/21 12:30 AM   Specimen: Nasal Mucosa; Nasal Swab  Result Value Ref Range Status   MRSA by PCR Next Gen NOT DETECTED NOT DETECTED Final    Comment: (NOTE) The GeneXpert MRSA Assay (FDA approved for NASAL specimens only), is one component of a comprehensive MRSA colonization surveillance program. It is not intended to diagnose MRSA infection nor to guide or monitor treatment for MRSA infections. Test performance is not FDA approved in patients less than 14 years old. Performed at [facility], [address] [place], [place] [address]          Radiology Studies: CT HEAD WO CONTRAST (5MM)  Result Date: 03/25/2021 CLINICAL DATA:  Intracranial hemorrhage, follow-up examination EXAM: CT HEAD WITHOUT CONTRAST TECHNIQUE: Contiguous axial images were obtained from the base of the skull through the vertex without intravenous contrast. COMPARISON:  03/24/2021 at 9:08 p.m. FINDINGS: Brain: Extra-axial hemorrhagic fluid collection along the right cerebral convexity demonstrating a slightly lenticular configuration suggestive of a an epidural hematoma appears stable measuring 0.8 x 4.9 x 5.5 cm when measured in similar fashion. Mild associated mass effect upon the right cerebral hemisphere is unchanged. Stable left temporal extra-axial hemorrhagic fluid collection measuring 4 mm in thickness. No significant associated mass effect with this collection. No interval hemorrhage. No significant midline shift. Ventricular size is normal. No acute infarct. Remote lacunar infarct  within the right pons again noted. Cerebellum unremarkable. Vascular: No hyperdense vessel or unexpected calcification. Skull: Normal. Negative for fracture or focal lesion. Sinuses/Orbits: No acute finding. Other: Mastoid air cells and middle ear cavities are clear. IMPRESSION: Stable right frontotemporal epidural hematoma with associated mild mass effect upon the right cerebral hemisphere. Stable small left probable subdural hematoma without significant associated mass effect. No interval hemorrhage. Electronically Signed   By: Fidela Salisbury M.D.   On: 03/25/2021 00:38   CT HEAD WO CONTRAST (5MM)  Result Date: 03/24/2021 CLINICAL DATA:  Seizure, amnesia EXAM: CT HEAD WITHOUT CONTRAST TECHNIQUE: Contiguous axial images were obtained from the base of the skull through the vertex without intravenous contrast. COMPARISON:  None. FINDINGS: Brain: Acute extra-axial hemorrhage in the right temporal region as a lentiform appearance concerning for right temporal epidural hematoma. This measures up to 0.9 cm in thickness, and extends proximally 5 cm in anterior-posterior dimension and 6 cm in craniocaudal extent. Minimal underlying sulcal effacement without midline shift. There is a small contralateral left temporal subdural hematoma measuring up to 0.4 cm without mass effect. No signs of acute infarct. The lateral ventricles and midline structures are unremarkable. Vascular: No hyperdense vessel or unexpected calcification. Skull: Normal. Negative for fracture or focal lesion. Sinuses/Orbits: No acute finding. Other: None. IMPRESSION: 1. Acute right temporal extra-axial hematoma suspicious for epidural hematoma. This measures up to 0.9 cm in maximal thickness with mild underlying sulcal effacement. No midline shift. 2. Small contralateral acute left temporal subdural hematoma measuring up to 0.4 cm. No mass effect. 3. No evidence of acute infarct. Critical Value/emergent results were called by telephone at the time  of interpretation on 03/24/2021 at 9:19 pm to provider [facility] , who verbally acknowledged these results. Electronically Signed   By: Randa Ngo M.D.   On: 03/24/2021 21:25   MR BRAIN WO CONTRAST  Result Date: 03/26/2021 CLINICAL DATA:  Follow-up examination for intracranial hemorrhage, headache. Seizure. EXAM: MRI HEAD WITHOUT CONTRAST TECHNIQUE: Multiplanar, multiecho pulse sequences of the brain and surrounding structures  were obtained without intravenous contrast. COMPARISON:  Head CT from 03/25/2021. FINDINGS: Brain: Cerebral volume within normal limits. Mild chronic microvascular ischemic disease noted involving the periventricular and deep white matter both cerebral hemispheres as well as the pons. Small remote lacunar infarcts present at the left frontal corona radiata and right pons. Punctate 3 mm focus of restricted diffusion seen involving the right basal ganglia, consistent with an acute ischemic infarct (series 5, image 24). No associated hemorrhage or mass effect. No other evidence for acute or subacute ischemia. Gray-white matter differentiation otherwise maintained. Previously identified bilateral extra-axial hemorrhages again seen. Hemorrhage overlying the right cerebral convexity measures up to 7-8 mm in thickness, while the hemorrhage overlying the left cerebral convexity measures 2-3 mm in thickness. These are relatively stable in size from prior CT. The right-sided collection demonstrates mild mass effect on the subjacent right cerebral hemisphere without significant midline shift. No hydrocephalus or trapping. Basilar cisterns remain patent. No mass lesion or mass effect elsewhere within the brain. Pituitary gland suprasellar region normal. Midline structures intact and normal. Vascular: Major intracranial vascular flow voids are maintained. Skull and upper cervical spine: Craniocervical junction within normal limits. Bone marrow signal intensity within normal limits. No scalp soft  tissue abnormality. Sinuses/Orbits: Globes orbital soft tissues demonstrate no acute finding. Paranasal sinuses are largely clear. No significant mastoid effusion. Inner ear structures grossly normal. Other: None. IMPRESSION: 1. 3 mm acute ischemic nonhemorrhagic infarct involving the right basal ganglia. 2. Stable bilateral extra-axial hemorrhages, measuring up to 7-8 mm on the right and 2-3 mm in thickness on the left. No significant midline shift. 3. Underlying mild chronic microvascular ischemic disease with small remote lacunar infarcts involving the left frontal corona radiata and right pons. Electronically Signed   By: Jeannine Boga M.D.   On: 03/26/2021 04:30   DG Chest Port 1 View  Result Date: 03/25/2021 CLINICAL DATA:  Central line placement EXAM: PORTABLE CHEST 1 VIEW COMPARISON:  03/25/2021 at 0010 hours FINDINGS: Low lung volumes. Lungs are clear. No pleural effusion or pneumothorax. The heart is normal in size. Left IJ venous catheter terminates in the mid SVC. IMPRESSION: Left IJ venous catheter terminates in the mid SVC. Electronically Signed   By: Julian Hy M.D.   On: 03/25/2021 19:22   DG Chest Port 1 View  Result Date: 03/25/2021 CLINICAL DATA:  Seizure EXAM: PORTABLE CHEST 1 VIEW COMPARISON:  None. FINDINGS: The heart size and mediastinal contours are within normal limits. Both lungs are clear. The visualized skeletal structures are unremarkable. IMPRESSION: No active disease. Electronically Signed   By: Fidela Salisbury M.D.   On: 03/25/2021 00:38        Scheduled Meds:  amLODipine  10 mg Oral Daily   atorvastatin  20 mg Oral Daily   Chlorhexidine Gluconate Cloth  6 each Topical Daily   [START ON 03/27/2021] influenza vac split quadrivalent PF  0.5 mL Intramuscular Tomorrow-1000   insulin aspart  0-20 Units Subcutaneous TID AC & HS   insulin detemir  15 Units Subcutaneous BID   labetalol  100 mg Oral TID   lisinopril  40 mg Oral Daily   Continuous  Infusions:  lactated ringers with kcl 75 mL/hr at 03/25/21 2230   levETIRAcetam 500 mg (03/25/21 2046)   potassium chloride 10 mEq (03/26/21 0840)     LOS: 2 days   The patient is critically ill with multiple organ systems failure and requires high complexity decision making for assessment and support, frequent evaluation and  titration of therapies, application of advanced monitoring technologies and extensive interpretation of multiple databases. Critical Care Time devoted to patient care services described in this note  Time spent: 40 minutes     Tiyah Zelenak, Geraldo Docker, MD [facility]   If 7PM-7AM, please contact night-coverage 03/26/2021, 8:47 AM

## 2021-03-26 NOTE — Plan of Care (Signed)
Neuro: alert and oriented-no neuro deficits, not compliant with medical plan, moves well in bed Resp: stable on room air CV: afebrile, hypertensive throughout the shift, running higher with stimulation from family and phone calls but blood pressure decreases at rest and when no visitors in the room/no stimulation therefore no PRNs were given GIGU: using urinal, tolerating PO well, not compliant with carb modified/cardiac diet, having family bring food in (fast food such as bojangles), eating without notifying nursing staff, diet reviewed with patient multiple times throughout the shift, notified of significant risks and consequences of not adhering to diabetic/hypertensive needs and care,  Skin: clean and intact Social: Brother and girlfriend visiting throughout the day, patient frequently on the telephone, speaking very loudly, gets worked up very easily by people on the phone and visitors, patient is very demanding and not compliant with medical plan  Explained in detail diagnosis, causes and medical management. Discussed with patient the risks of eating a salt/fat/carb/sugar heavy diet is detrimental to his overall health and will ultimately contribute to further health issues and delay recovery from this hospitalization. Patient has history of non-compliance to medical care, stated multiple times he never should have stopped taking his insulin. He has also stopped taking many of his pills without further explanation. Patient still appears to be in denial of current health issues and shuts down educational opportunities.   Problem: Education: Goal: Knowledge of General Education information will improve Description: Including pain rating scale, medication(s)/side effects and non-pharmacologic comfort measures Outcome: Not Progressing   Problem: Health Behavior/Discharge Planning: Goal: Ability to manage health-related needs will improve Outcome: Not Progressing   Problem: Clinical  Measurements: Goal: Ability to maintain clinical measurements within normal limits will improve Outcome: Not Progressing Goal: Will remain free from infection Outcome: Not Progressing Goal: Diagnostic test results will improve Outcome: Not Progressing Goal: Respiratory complications will improve Outcome: Not Progressing Goal: Cardiovascular complication will be avoided Outcome: Not Progressing   Problem: Activity: Goal: Risk for activity intolerance will decrease Outcome: Not Progressing   Problem: Nutrition: Goal: Adequate nutrition will be maintained Outcome: Not Progressing   Problem: Coping: Goal: Level of anxiety will decrease Outcome: Not Progressing   Problem: Elimination: Goal: Will not experience complications related to bowel motility Outcome: Not Progressing Goal: Will not experience complications related to urinary retention Outcome: Not Progressing   Problem: Pain Managment: Goal: General experience of comfort will improve Outcome: Not Progressing   Problem: Safety: Goal: Ability to remain free from injury will improve Outcome: Not Progressing   Problem: Skin Integrity: Goal: Risk for impaired skin integrity will decrease Outcome: Not Progressing

## 2021-03-26 NOTE — Progress Notes (Addendum)
Neurology progress note  Patient ID: 60 yo man with pmhx significant for DM2, HTN, HL, CKD stage 3b admitted 03/24/21 for first-time seizure in the setting of severe hyperglycemia leading to head trauma c/b small epidural and subdural hematomas.   Interval events: MRI brain wo contrast showed 29mm acute ischemic infarct in R basal ganglia and multiple remote infarcts; see below. Patient is asymptomatic, feeling well, no new complaints today.  Interval data  MRI brain wo  1. 3 mm acute ischemic nonhemorrhagic infarct involving the right basal ganglia. 2. Stable bilateral extra-axial hemorrhages, measuring up to 7-8 mm on the right and 2-3 mm in thickness on the left. No significant midline shift. 3. Underlying mild chronic microvascular ischemic disease with small remote lacunar infarcts involving the left frontal corona radiata and right pons.      Component Value Date/Time   CHOL 264 (H) 03/25/2021 0441   TRIG 325 (H) 03/25/2021 0441   HDL 29 (L) 03/25/2021 0441   CHOLHDL 9.1 03/25/2021 0441   VLDL 65 (H) 03/25/2021 0441   LDLCALC 170 (H) 03/25/2021 0441    Lab Results  Component Value Date/Time   HGBA1C 12.5 (H) 03/25/2021 01:55 AM   Exam  Vitals:   03/26/21 1000 03/26/21 1200  BP:  (!) 168/104  Pulse: 95 95  Resp: (!) 26 20  Temp:  97.8 F (36.6 C)  SpO2: 99% 100%   Physical Exam Gen: A&O x4, NAD CV: RRR, no m/g/r; nml S1 and S2. 2+ symmetric peripheral pulses. Resp: CTAB   Neuro: *MS: A&O x4. Follows multi-step commands.  *Speech: fluid, nondysarthric, able to name and repeat *CN:    I: Deferred   II,III: PERRLA, VFF by confrontation, optic discs unable to be visualized 2/2 pupillary constriction   III,IV,VI: EOMI w/o nystagmus, no ptosis   V: Sensation intact from V1 to V3 to LT   VII: Eyelid closure was full.  Smile symmetric.   VIII: Hearing intact to voice   IX,X: Voice normal, palate elevates symmetrically    XI: SCM/trap 5/5 bilat   XII: Tongue  protrudes midline, no atrophy or fasciculations    *Motor:   Normal bulk.  No tremor, rigidity or bradykinesia. No pronator drift.     Strength: Dlt Bic Tri WrE WrF FgS Gr HF KnF KnE PlF DoF    Left 5 5 5 5 5 5 5 5 5 5 5 5     Right 5 5 5 5 5 5 5 5 5 5 5 5       *Sensory: Intact to light touch, pinprick, temperature vibration throughout. Symmetric. Propioception intact bilat.  No double-simultaneous extinction.  *Coordination:  Finger-to-nose, heel-to-shin, rapid alternating motions were intact. *Reflexes:  2+ and symmetric throughout without clonus; toes down-going bilat *Gait: deferred   NIHSS = 0   Premorbid mRS = 0  A/p: This is a 60 yo man with pmhx significant for DM2, HTN, HL, CKD stage 3b admitted for seizure-like activity leading to head trauma c/b small epidural and subdural hematomas. He is back to his mental status baseline with a currently normal neurologic exam. Patient has no known hx seizure. Suspect seizure provoked in setting of severe hyperglycemia, 46mm acute BG infarct on MRI is asymptomatic and an incidental finding.  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 - BP control SBP 130-150 -  No current indication for surgical intervention per NSU - STAT head CT for any decline in neurologic exam  # Acute ischemic infarct R BG, incidental finding # Multiple remote ischemic infarcts - MRA H&N - TTE - Goal SBP 130-150, no permissive HTN in the setting of subacute epidural and subdural hematoma - Hold antiplatelets for now in the setting of subacute epidural and subdural hematoma - SCDs for DVT prophylaxis - DM mgmt per primary team - Atorvastatin 80mg  daily for LDL 170 - q4 hr neuro checks - STAT head CT for any change in neuro exam - Tele - PT/OT/SLP - Stroke education - Ambulatory referral to  neurology upon hospital discharge   Will continue to follow

## 2021-03-26 NOTE — Progress Notes (Signed)
PHARMACY CONSULT NOTE - FOLLOW UP  Pharmacy Consult for Electrolyte Monitoring and Replacement   Recent Labs: Potassium (mmol/L)  Date Value  03/26/2021 3.4 (L)   Magnesium (mg/dL)  Date Value  03/26/2021 2.1   Calcium (mg/dL)  Date Value  03/26/2021 7.0 (L)   Albumin (g/dL)  Date Value  03/24/2021 3.1 (L)   Phosphorus (mg/dL)  Date Value  03/26/2021 3.3   Sodium (mmol/L)  Date Value  03/26/2021 135   Corrected Ca: 8.32  Assessment: Pharmacy has been consulted to monitor electrolytes in 59yo patient with epidural and subdural hematoma following a witnessed fall possibly d/t seizure activity.   IVF: LR w/ 40 mEq KCL@75ml /hr  Goal of Therapy: Electrolytes WNL   Plan:  K 3.4, replace with KCL 72mEq IV x 3 runs LR with 71meq KCL @ 87ml/hr to continue Will follow up morning labs and continue replacement as needed.   Pearla Dubonnet, PharmD Clinical Pharmacist 03/26/2021 7:43 AM

## 2021-03-26 NOTE — Progress Notes (Signed)
Inpatient Diabetes Program Recommendations  AACE/ADA: New Consensus Statement on Inpatient Glycemic Control   Target Ranges:  Prepandial:   less than 140 mg/dL      Peak postprandial:   less than 180 mg/dL (1-2 hours)      Critically ill patients:  140 - 180 mg/dL  Results for Chase Rodriguez, Chase Rodriguez (MRN [id number]) as of 03/26/2021 07:44  Ref. Range 03/26/2021 04:18  Glucose Latest Ref Range: 70 - 99 mg/dL 364 (H)   Results for [name], [name] (MRN [id number]) as of 03/26/2021 07:44  Ref. Range 03/25/2021 08:06 03/25/2021 08:56 03/25/2021 10:10 03/25/2021 11:13 03/25/2021 12:10 03/25/2021 15:46 03/25/2021 21:03 03/26/2021 00:24  Glucose-Capillary Latest Ref Range: 70 - 99 mg/dL 164 (H) 190 (H) 279 (H) 220 (H) 224 (H) 251 (H) 314 (H) 268 (H)    Review of Glycemic Control  Diabetes history: DM2 Outpatient Diabetes medications: None on home med list; noted H&P has Levemir 50 units daily, Metformin 1000 mg BID Current orders for Inpatient glycemic control: Levemir 15 units BID, Novolog 0-20 units AC&HS  Inpatient Diabetes Program Recommendations:    Insulin: Please consider increasing Levemir to 25 units BID and ordering Novolog 6 units TID with meals for meal coverage if patient eats at least 50% of meals.  Thanks, Barnie Alderman, RN, MSN, CDE Diabetes Coordinator Inpatient Diabetes Program [phone] (Team Pager from 8am to 5pm)

## 2021-03-27 ENCOUNTER — Inpatient Hospital Stay (HOSPITAL_COMMUNITY)
Admit: 2021-03-27 | Discharge: 2021-03-27 | Disposition: A | Discharge status: Home / Self Care | Payer: Self-pay | Attending: Internal Medicine | Admitting: Internal Medicine

## 2021-03-27 DIAGNOSIS — I10 Essential (primary) hypertension: Secondary | ICD-10-CM

## 2021-03-27 DIAGNOSIS — B182 Chronic viral hepatitis C: Secondary | ICD-10-CM

## 2021-03-27 DIAGNOSIS — I6389 Other cerebral infarction: Secondary | ICD-10-CM

## 2021-03-27 DIAGNOSIS — E1165 Type 2 diabetes mellitus with hyperglycemia: Secondary | ICD-10-CM

## 2021-03-27 DIAGNOSIS — E119 Type 2 diabetes mellitus without complications: Secondary | ICD-10-CM | POA: Diagnosis present

## 2021-03-27 DIAGNOSIS — E669 Obesity, unspecified: Secondary | ICD-10-CM | POA: Diagnosis present

## 2021-03-27 DIAGNOSIS — I6381 Other cerebral infarction due to occlusion or stenosis of small artery: Secondary | ICD-10-CM

## 2021-03-27 DIAGNOSIS — E7800 Pure hypercholesterolemia, unspecified: Secondary | ICD-10-CM

## 2021-03-27 DIAGNOSIS — E785 Hyperlipidemia, unspecified: Secondary | ICD-10-CM | POA: Diagnosis present

## 2021-03-27 LAB — ECHOCARDIOGRAM COMPLETE BUBBLE STUDY
AR max vel: 3.09 cm2
AV Area VTI: 3.47 cm2
AV Area mean vel: 2.42 cm2
AV Mean grad: 6 mmHg
AV Peak grad: 8.4 mmHg
Ao pk vel: 1.45 m/s
Area-P 1/2: 5.84 cm2
MV VTI: 4.79 cm2
S' Lateral: 3 cm

## 2021-03-27 LAB — COMPREHENSIVE METABOLIC PANEL
ALT: 12 U/L (ref 0–44)
AST: 41 U/L (ref 15–41)
Albumin: 2.5 g/dL — ABNORMAL LOW (ref 3.5–5.0)
Alkaline Phosphatase: 53 U/L (ref 38–126)
Anion gap: 9 (ref 5–15)
BUN: 11 mg/dL (ref 6–20)
CO2: 27 mmol/L (ref 22–32)
Calcium: 6.7 mg/dL — ABNORMAL LOW (ref 8.9–10.3)
Chloride: 105 mmol/L (ref 98–111)
Creatinine, Ser: 1.97 mg/dL — ABNORMAL HIGH (ref 0.61–1.24)
GFR, Estimated: 39 mL/min — ABNORMAL LOW (ref >60)
Glucose, Bld: 187 mg/dL — ABNORMAL HIGH (ref 70–99)
Potassium: 3.1 mmol/L — ABNORMAL LOW (ref 3.5–5.1)
Sodium: 141 mmol/L (ref 135–145)
Total Bilirubin: 0.5 mg/dL (ref 0.3–1.2)
Total Protein: 5.8 g/dL — ABNORMAL LOW (ref 6.5–8.1)

## 2021-03-27 LAB — CBC WITH DIFFERENTIAL/PLATELET
Abs Immature Granulocytes: 0.03 10*3/uL (ref 0.00–0.07)
Basophils Absolute: 0 10*3/uL (ref 0.0–0.1)
Basophils Relative: 0 %
Eosinophils Absolute: 0 10*3/uL (ref 0.0–0.5)
Eosinophils Relative: 0 %
HCT: 37.2 % — ABNORMAL LOW (ref 39.0–52.0)
Hemoglobin: 12.8 g/dL — ABNORMAL LOW (ref 13.0–17.0)
Immature Granulocytes: 0 %
Lymphocytes Relative: 32 %
Lymphs Abs: 2.2 10*3/uL (ref 0.7–4.0)
MCH: 27.8 pg (ref 26.0–34.0)
MCHC: 34.4 g/dL (ref 30.0–36.0)
MCV: 80.9 fL (ref 80.0–100.0)
Monocytes Absolute: 0.7 10*3/uL (ref 0.1–1.0)
Monocytes Relative: 9 %
Neutro Abs: 4 10*3/uL (ref 1.7–7.7)
Neutrophils Relative %: 59 %
Platelets: 239 10*3/uL (ref 150–400)
RBC: 4.6 MIL/uL (ref 4.22–5.81)
RDW: 13.8 % (ref 11.5–15.5)
WBC: 7 10*3/uL (ref 4.0–10.5)
nRBC: 0 % (ref 0.0–0.2)

## 2021-03-27 LAB — PHOSPHORUS: Phosphorus: 2.9 mg/dL (ref 2.5–4.6)

## 2021-03-27 LAB — MAGNESIUM: Magnesium: 1.3 mg/dL — ABNORMAL LOW (ref 1.7–2.4)

## 2021-03-27 LAB — GLUCOSE, CAPILLARY
Glucose-Capillary: 143 mg/dL — ABNORMAL HIGH (ref 70–99)
Glucose-Capillary: 175 mg/dL — ABNORMAL HIGH (ref 70–99)
Glucose-Capillary: 208 mg/dL — ABNORMAL HIGH (ref 70–99)
Glucose-Capillary: 222 mg/dL — ABNORMAL HIGH (ref 70–99)
Glucose-Capillary: 246 mg/dL — ABNORMAL HIGH (ref 70–99)
Glucose-Capillary: 399 mg/dL — ABNORMAL HIGH (ref 70–99)

## 2021-03-27 MED ORDER — POTASSIUM CHLORIDE CRYS ER 20 MEQ PO TBCR
40.0000 meq | EXTENDED_RELEASE_TABLET | Freq: Once | ORAL | Status: AC
  Administered 2021-03-27: 40 meq via ORAL
  Filled 2021-03-27: qty 2

## 2021-03-27 MED ORDER — POTASSIUM CHLORIDE CRYS ER 20 MEQ PO TBCR: 50.0000 meq | EXTENDED_RELEASE_TABLET | Freq: Two times a day (BID) | ORAL | Status: DC

## 2021-03-27 MED ORDER — INSULIN ASPART 100 UNIT/ML IJ SOLN
0.0000 [IU] | INTRAMUSCULAR | Status: DC
  Administered 2021-03-27: 3 [IU] via SUBCUTANEOUS
  Administered 2021-03-27: 2 [IU] via SUBCUTANEOUS
  Administered 2021-03-27: 1 [IU] via SUBCUTANEOUS
  Administered 2021-03-27: 3 [IU] via SUBCUTANEOUS
  Administered 2021-03-28: 9 [IU] via SUBCUTANEOUS
  Administered 2021-03-28: 2 [IU] via SUBCUTANEOUS
  Administered 2021-03-28 (×2): 1 [IU] via SUBCUTANEOUS
  Administered 2021-03-29: 3 [IU] via SUBCUTANEOUS
  Administered 2021-03-29: 2 [IU] via SUBCUTANEOUS
  Filled 2021-03-27 (×10): qty 1

## 2021-03-27 MED ORDER — POTASSIUM CHLORIDE 10 MEQ/100ML IV SOLN
10.0000 meq | INTRAVENOUS | Status: AC
  Administered 2021-03-27 (×4): 10 meq via INTRAVENOUS
  Filled 2021-03-27 (×4): qty 100

## 2021-03-27 MED ORDER — LABETALOL HCL 100 MG PO TABS
150.0000 mg | ORAL_TABLET | Freq: Three times a day (TID) | ORAL | Status: DC
  Administered 2021-03-27 – 2021-03-30 (×9): 150 mg via ORAL
  Filled 2021-03-27 (×11): qty 1.5

## 2021-03-27 MED ORDER — INSULIN ASPART 100 UNIT/ML IJ SOLN: 0.0000 [IU] | INTRAMUSCULAR | Status: DC

## 2021-03-27 NOTE — Progress Notes (Signed)
EEG: This study is within normal limits. No seizures or epileptiform discharges were seen throughout the recording.   MRA head: Negative intracranial MRA. No large vessel occlusion. No hemodynamically significant stenosis. Predominant fetal type origin of the PCAs with overall diminutive vertebrobasilar system.  MRA neck: Negative MRA of the neck. No hemodynamically ignificant stenosis or other acute vascular abnormality.  TTE: 1. LVEF 60 to 65%. The left ventricle has normal function. The left ventricle has no regional wall motion abnormalities. Left ventricular diastolic parameters are consistent with Grade I diastolic dysfunction. Elevated left atrial pressure.  Mild to moderate aortic valve  sclerosis/calcification is present, without any evidence of aortic  stenosis. Agitated saline contrast bubble study was negative, with no evidence of any interatrial shunt.   Assessment: 60 yo male with pmhx significant for DM2, HTN, HL, CKD stage 3b admitted for seizure-like activity leading to head trauma c/b small epidural and subdural hematomas. Patient has no known hx seizure. Suspect seizure provoked in setting of severe hyperglycemia. 3 mm acute BG infarct on MRI is asymptomatic and most likely unrelated to his presentation; also noted on MRI were multiple remote ischemic infarcts - He is back to his mental status baseline.  - EEG negative for seizures or epileptiform discharges.  - MRA of head and neck without hemodynamically significant stenosis  Recommendations: - For left subdural hematoma, continue neurochecks q 4 hours. SBP goal<150 - For new onset seizure, continue Keppra at 500 mg BID. Will also need to continue at discharge.  - Ambulatory referral to outpatient Neurology upon hospital discharge - PT/OT/Speech - Hold antiplatelet agent for now in the setting of subacute epidural and subdural hematoma. - Re-image with head CT in 2 weeks. If hemorrhages are resolving without evidence for  rebleeding, most likely will be safe to restart antiplatelet medication for secondary stroke prevention, given that the hemorrhages were traumatic.  - Ambulatory referral to neurology within 2 weeks of hospital discharge - Neurohospitalist service will sign off. Please call if there are additional questions.   Electronically signed: Dr. Kerney Elbe

## 2021-03-27 NOTE — Progress Notes (Signed)
PHARMACY CONSULT NOTE - FOLLOW UP  Pharmacy Consult for Electrolyte Monitoring and Replacement   Recent Labs: Potassium (mmol/L)  Date Value  03/27/2021 3.1 (L)   Magnesium (mg/dL)  Date Value  03/27/2021 1.3 (L)   Calcium (mg/dL)  Date Value  03/27/2021 6.7 (L)   Albumin (g/dL)  Date Value  03/27/2021 2.5 (L)   Phosphorus (mg/dL)  Date Value  03/27/2021 2.9   Sodium (mmol/L)  Date Value  03/27/2021 141   Corrected Ca: 8.32  Assessment: Pharmacy has been consulted to monitor electrolytes in 59yo patient with epidural and subdural hematoma following a witnessed fall possibly d/t seizure activity.   IVF: LR w/ 40 mEq KCL@75ml /hr  Goal of Therapy: Electrolytes WNL   Plan:  K 2.8>3.1, in response to KCL 55mEq IV x4 and 101mEq KCL PO Will repeat KCL IV q1hr x4 runs and 44meq PO today. Continues  LR with 93meq KCL @ 83ml/hr to continue Will follow up AM labs and continue replacement as needed.  Lorna Dibble, PharmD Clinical Pharmacist 03/27/2021 8:52 AM

## 2021-03-27 NOTE — TOC Initial Note (Signed)
Transition of Care [facility]) - Initial/Assessment Note    Patient Details  Name: TABIUS ROOD MRN: [id number] Date of Birth: 02/12/1961  [facility]) CM/SW Contact:    Ova Freshwater Phone Number: [phone] 03/27/2021, 2:30 PM  Clinical Narrative:                  Patient presents to [facility] from home due to a witnessed seizure.  Patient is active with [facility]. Patient has a hx of non-compliance with medications.  Patient is uninsured. CSW called the patient in the room and was not able to speak with the patient.  CSW spoke with the patient's brother Rayshard, Schirtzinger (Brother)  [phone], explained role of TOC in patient care and discharge planning.  CSW stated I would try to contact patient again.  Expected Discharge Plan: [place] Barriers to Discharge: Inadequate or no insurance, Continued Medical Work up   Patient Goals and CMS Choice   CMS Medicare.gov Compare Post Acute Care list provided to:: Other (Comment Required) Choice offered to / list presented to : Adult Children  Expected Discharge Plan and Services Expected Discharge Plan: [place] In-house Referral: Clinical Social Work     Living arrangements for the past 2 months: Mobile Home                                      Prior Living Arrangements/Services Living arrangements for the past 2 months: Mobile Home Lives with:: Significant Other Patient language and need for interpreter reviewed:: Yes Do you feel safe going back to the place where you live?: Yes      Need for Family Participation in Patient Care: Yes (Comment) Care giver support system in place?: Yes (comment)   Criminal Activity/Legal Involvement Pertinent to Current Situation/Hospitalization: No - Comment as needed  Activities of Daily Living Home Assistive Devices/Equipment: None ADL Screening (condition at time of admission) Patient's cognitive ability adequate  to safely complete daily activities?: Yes Is the patient deaf or have difficulty hearing?: No Does the patient have difficulty seeing, even when wearing glasses/contacts?: No Does the patient have difficulty concentrating, remembering, or making decisions?: No Patient able to express need for assistance with ADLs?: Yes Does the patient have difficulty dressing or bathing?: No Independently performs ADLs?: Yes (appropriate for developmental age) Does the patient have difficulty walking or climbing stairs?: No Weakness of Legs: None Weakness of Arms/Hands: None  Permission Sought/Granted Permission sought to share information with : Family Supports Permission granted to share information with : Yes, Verbal Permission Granted  Share Information with NAME: Freundlich,Melvin (Brother)   [phone] (Mobile)           Emotional Assessment Appearance:: Appears stated age Attitude/Demeanor/Rapport: Engaged Affect (typically observed): Stable Orientation: : Oriented to Self, Oriented to Place, Oriented to  Time, Oriented to Situation Alcohol / Substance Use: Not Applicable Psych Involvement: No (comment)  Admission diagnosis:  Hypokalemia [E87.6] Hypomagnesemia [E83.42] Seizure ([place]) [R56.9] Seizures ([facility]) [R56.9] Epidural hematoma [S06.4XAA] Hyperosmolar hyperglycemic state (HHS) ([place]) [E11.00] Patient Active Problem List   Diagnosis Date Noted   Epidural hematoma    Seizure ([place]) 03/24/2021   PCP:  [facility] [place]:   [place], [place] - [address] [place] [address] Phone: [phone] Fax: [phone]     Social Determinants of Health (  SDOH) Interventions    Readmission Risk Interventions No flowsheet data found.

## 2021-03-27 NOTE — Progress Notes (Signed)
Eeg done 

## 2021-03-27 NOTE — Progress Notes (Signed)
PROGRESS NOTE    Chase Rodriguez  XVQ:[id number] DOB: 23-Nov-1960 DOA: 03/24/2021 PCP: [facility] [place]   Brief Narrative:  60 y.o. BM PMHx DM type II uncontrolled with complication, DM nephropathy, CKD stage IIIb, essential HTN, HLD, Hepatitis  Presents with the chief complaint of yncopal episode/seizure.    According to girlfriend, patent sitting on couch and eating food and then slumped over and had jerking of his right arm and incontinence.  He fell to the ground striking the right side of his head on the carpeted floor.  She reported possibly as much as 30 minutes of this right arm seizure activity & unresponsive state prior to self resolving.  He was slow to recover and seemed confused.  Currently, he feels that he is at his neurological baseline.  No headaches.  No weakness.  No nausea or vomiting.    Subjective: 10/31 afebrile overnight A/O x4, negative SOB, negative CP negative headache, negative vision changes    Assessment & Plan:  Covid vaccination; vaccinated 2/3  Active Problems:   Seizure ([place])   Epidural hematoma   Basal ganglia infarction ([place])   Lacunar infarct, acute ([place])   Uncontrolled type 2 diabetes mellitus with hyperglycemia ([place])   Essential hypertension   HLD (hyperlipidemia)   Chronic hepatitis C without hepatic coma ([place])   Obese   Acute ischemic basal ganglia infarct/Chronic Lacunar infarct - See results MRI 10/30 below. -Treatment per neurology  LEFT subdural hematoma -Neurochecks q 4 hours - MRI/MRA brain pending -SBP goal<150  Right epidural hematoma -See subdural hematoma and essential HTN  Witnessed Seizure -EEG scheduled for the A.m. -Keppra 500 mg BID - Ambulatory referral to neurology upon hospital discharge  DM type II uncontrolled with Hyperglycemia - 10/29 Hemoglobin A1c= 12.5 -Levemir 15 units BID -NovoLog 8 units qac -10/31 decrease sensitive SSI   DM nephropathy/CKD stage IIIb Lab Results  Component  Value Date   CREATININE 1.97 (H) 03/27/2021   CREATININE 2.08 (H) 03/26/2021   CREATININE 2.19 (H) 03/26/2021   CREATININE 2.19 (H) 03/26/2021   CREATININE 2.23 (H) 03/26/2021    Essential HTN -Goal SBP 130-150, no permissive HTN in the setting of subacute epidural and subdural hematoma -Amlodipine 10 mg daily - Hydralazine IV PRN - Labetalol IV PRN -10/31 increase Labetalol 150 mg TID -Lisinopril 40 mg daily  HLD -10/29 LDL= 170: Goal<70 mg/deciliter -Lipitor 80 mg daily  Chronic Hepatitis C without Hepatic Coma. -Unsure if patient received treatment will address in a.m. obviously would not acutely receive treatment now.  Hypokalemia -Potassium goal> 4 -10/31 -10/31 Potassium 80 mEq   Hypomagnesmia - Magnesium goal> 2 - 10/21 Magnesium IV 4g  Hypocalcemia   Obese (BMI 31. 76 kg/m)      DVT prophylaxis: SCD Code Status: Full Family Communication: 10/30 brother and girlfriend at bedside for discussion of plan of care all questions answered Status is: Inpatient    Dispo: The patient is from: Home              Anticipated d/c is to: Home              Anticipated d/c date is: 3 days              Patient currently is not medically stable to d/c.      Consultants:  Neurosurgery  Neurology PCCM  Procedures/Significant Events:  10/30 MRI brain  Wo contrast 3 mm acute ischemic nonhemorrhagic infarct involving the right basal ganglia. 2. Stable bilateral extra-axial  hemorrhages, measuring up to 7-8 mm on the right and 2-3 mm in thickness on the left. No significant midline shift. 3. Underlying mild chronic microvascular ischemic disease with small remote lacunar infarcts involving the left frontal corona radiata and right pons. 10/30 MRA head/neck ANTERIOR CIRCULATION:   Examination mildly degraded by motion artifact.   Visualized distal cervical segments of the internal carotid arteries are patent with antegrade flow. Petrous, cavernous, and  supraclinoid segments patent without stenosis or other abnormality. A1 segments widely patent. Normal anterior communicating artery complex. Anterior cerebral arteries patent to their distal aspects without stenosis. No M1 stenosis or occlusion. Normal MCA bifurcations. Distal MCA branches perfused and symmetric.   POSTERIOR CIRCULATION:   Right vertebral artery dominant and patent to the vertebrobasilar junction without stenosis. Right PICA origin patent. Diminutive left vertebral artery largely terminates in PICA, although a small branch a sense or the vertebrobasilar junction. Left PICA patent as well. Basilar somewhat diminutive. Apparent mild narrowing about the mid basilar artery favored to be artifactual in nature. Basilar otherwise patent to its distal aspect. Superior cerebellar arteries patent bilaterally. Predominant fetal type origin of the PCAs, both of which are well perfused to their distal aspects without stenosis.   No intracranial aneurysm or other vascular abnormality.   IMPRESSION: 1. Negative MRA of the neck. No hemodynamically significant stenosis or other acute vascular abnormality. 2. Negative intracranial MRA. No large vessel occlusion. No hemodynamically significant stenosis. 3. Predominant fetal type origin of the PCAs with overall diminutive vertebrobasilar system.  I have personally reviewed and interpreted all radiology studies and my findings are as above.  VENTILATOR SETTINGS:    Cultures 10/28 SARS coronavirus negative 10/28 influenza A/B negative   Antimicrobials:    Devices    LINES / TUBES:      Continuous Infusions:  lactated ringers with kcl 75 mL/hr at 03/27/21 1754   levETIRAcetam Stopped (03/27/21 0838)     Objective: Vitals:   03/27/21 1230 03/27/21 1500 03/27/21 1600 03/27/21 1602  BP: 140/76 (!) 171/94 (!) 149/86 (!) 149/86  Pulse: 93 96 93 94  Resp:   16   Temp:      TempSrc:      SpO2: 92% 99% 95%    Weight:      Height:        Intake/Output Summary (Last 24 hours) at 03/27/2021 1906 Last data filed at 03/27/2021 0954 Gross per 24 hour  Intake 1576.47 ml  Output 2750 ml  Net -1173.53 ml   Filed Weights   03/25/21 0500 03/26/21 0425 03/27/21 0455  Weight: 101 kg 105.1 kg 103.3 kg    Examination:  General: No acute respiratory distress Eyes: negative scleral hemorrhage, negative anisocoria, negative icterus ENT: Negative Runny nose, negative gingival bleeding, Neck:  Negative scars, masses, torticollis, lymphadenopathy, JVD Lungs: Clear to auscultation bilaterally without wheezes or crackles Cardiovascular: Regular rate and rhythm without murmur gallop or rub normal S1 and S2 Abdomen: negative abdominal pain, nondistended, positive soft, bowel sounds, no rebound, no ascites, no appreciable mass Extremities: No significant cyanosis, clubbing, or edema bilateral lower extremities Skin: Negative rashes, lesions, ulcers Psychiatric:  Negative depression, negative anxiety, negative fatigue, negative mania  Central nervous system:  Cranial nerves II through XII intact, tongue/uvula midline, all extremities muscle strength 5/5, sensation intact throughout, quick finger touch bilateral within normal limits, negative dysarthria, negative expressive aphasia, negative receptive aphasia.  .     Data Reviewed: Care during the described time interval was provided by me .  I have reviewed this patient's available data, including medical history, events of note, physical examination, and all test results as part of my evaluation.   CBC: Recent Labs  Lab 03/24/21 1405 03/25/21 0441 03/26/21 0418 03/27/21 0608  WBC 8.7 8.7 9.9 7.0  NEUTROABS  --   --   --  4.0  HGB 15.1 14.2 13.6 12.8*  HCT 42.0 39.1 38.3* 37.2*  MCV 81.1 80.0 81.3 80.9  PLT 249 240 228 240   Basic Metabolic Panel: Recent Labs  Lab 03/25/21 0441 03/25/21 1018 03/25/21 1402 03/25/21 1958 03/26/21 0007  03/26/21 0418 03/26/21 1414 03/26/21 2107 03/27/21 0608  NA  --    < > 137 137 135 135 138 138 141  K  --    < > 2.0* 2.5* 2.6* 3.4* 3.1* 2.8* 3.1*  CL  --    < > 96* 98 98 99 102 103 105  CO2  --    < > 32 29 28 27 27 27 27   GLUCOSE  --    < > 227* 242* 317* 364* 296* 259* 187*  BUN  --    < > 17 14 16 16 15 13 11   CREATININE  --    < > 2.06* 2.02* 2.23* 2.19* 2.19* 2.08* 1.97*  CALCIUM  --    < > 7.6* 7.3* 7.2* 7.0* 6.7* 6.7* 6.7*  MG 1.7  --  1.5* 1.6*  --  2.1 1.7  --  1.3*  PHOS 2.1*  --   --  3.3  --  3.3 2.6  --  2.9   < > = values in this interval not displayed.   GFR: Estimated Creatinine Clearance: 50 mL/min (A) (by C-G formula based on SCr of 1.97 mg/dL (H)). Liver Function Tests: Recent Labs  Lab 03/24/21 2105 03/26/21 1414 03/27/21 0608  AST 21 38 41  ALT 12 12 12   ALKPHOS 65 64 53  BILITOT 0.5 0.5 0.5  PROT 7.4 5.8* 5.8*  ALBUMIN 3.1* 2.5* 2.5*   No results for input(s): LIPASE, AMYLASE in the last 168 hours. No results for input(s): AMMONIA in the last 168 hours. Coagulation Profile: Recent Labs  Lab 03/24/21 2105  INR 1.0   Cardiac Enzymes: No results for input(s): CKTOTAL, CKMB, CKMBINDEX, TROPONINI in the last 168 hours. BNP (last 3 results) No results for input(s): PROBNP in the last 8760 hours. HbA1C: Recent Labs    03/25/21 0155  HGBA1C 12.5*   CBG: Recent Labs  Lab 03/26/21 1610 03/26/21 2200 03/27/21 0748 03/27/21 1222 03/27/21 1555  GLUCAP 297* 232* 208* 222* 175*   Lipid Profile: Recent Labs    03/25/21 0441  CHOL 264*  HDL 29*  LDLCALC 170*  TRIG 325*  CHOLHDL 9.1   Thyroid Function Tests: Recent Labs    03/25/21 1018  TSH 0.687  T4TOTAL 6.8   Anemia Panel: No results for input(s): VITAMINB12, FOLATE, FERRITIN, TIBC, IRON, RETICCTPCT in the last 72 hours. Urine analysis:    Component Value Date/Time   COLORURINE STRAW (A) 03/24/2021 2053   APPEARANCEUR CLEAR (A) 03/24/2021 2053   LABSPEC 1.016 03/24/2021  2053   PHURINE 6.0 03/24/2021 2053   GLUCOSEU >=500 (A) 03/24/2021 2053   HGBUR MODERATE (A) 03/24/2021 2053   BILIRUBINUR NEGATIVE 03/24/2021 2053   KETONESUR NEGATIVE 03/24/2021 2053   PROTEINUR >=300 (A) 03/24/2021 2053   NITRITE NEGATIVE 03/24/2021 2053   LEUKOCYTESUR NEGATIVE 03/24/2021 2053   Sepsis Labs: @LABRCNTIP (procalcitonin:4,lacticidven:4)  ) Recent Results (from the  past 240 hour(s))  Resp Panel by RT-PCR (Flu A&B, Covid) Nasopharyngeal Swab     Status: None   Collection Time: 03/24/21  9:37 PM   Specimen: Nasopharyngeal Swab; Nasopharyngeal(NP) swabs in vial transport medium  Result Value Ref Range Status   SARS Coronavirus 2 by RT PCR NEGATIVE NEGATIVE Final    Comment: (NOTE) SARS-CoV-2 target nucleic acids are NOT DETECTED.  The SARS-CoV-2 RNA is generally detectable in upper respiratory specimens during the acute phase of infection. The lowest concentration of SARS-CoV-2 viral copies this assay can detect is 138 copies/mL. A negative result does not preclude SARS-Cov-2 infection and should not be used as the sole basis for treatment or other patient management decisions. A negative result may occur with  improper specimen collection/handling, submission of specimen other than nasopharyngeal swab, presence of viral mutation(s) within the areas targeted by this assay, and inadequate number of viral copies(<138 copies/mL). A negative result must be combined with clinical observations, patient history, and epidemiological information. The expected result is Negative.  Fact Sheet for Patients:  EntrepreneurPulse.com.au  Fact Sheet for Healthcare Providers:  IncredibleEmployment.be  This test is no t yet approved or cleared by the [place] FDA and  has been authorized for detection and/or diagnosis of SARS-CoV-2 by FDA under an Emergency Use Authorization (EUA). This EUA will remain  in effect (meaning this test can be  used) for the duration of the COVID-19 declaration under Section 564(b)(1) of the Act, 21 U.S.C.section 360bbb-3(b)(1), unless the authorization is terminated  or revoked sooner.       Influenza A by PCR NEGATIVE NEGATIVE Final   Influenza B by PCR NEGATIVE NEGATIVE Final    Comment: (NOTE) The Xpert Xpress SARS-CoV-2/FLU/RSV plus assay is intended as an aid in the diagnosis of influenza from Nasopharyngeal swab specimens and should not be used as a sole basis for treatment. Nasal washings and aspirates are unacceptable for Xpert Xpress SARS-CoV-2/FLU/RSV testing.  Fact Sheet for Patients: EntrepreneurPulse.com.au  Fact Sheet for Healthcare Providers: IncredibleEmployment.be  This test is not yet approved or cleared by the [place] FDA and has been authorized for detection and/or diagnosis of SARS-CoV-2 by FDA under an Emergency Use Authorization (EUA). This EUA will remain in effect (meaning this test can be used) for the duration of the COVID-19 declaration under Section 564(b)(1) of the Act, 21 U.S.C. section 360bbb-3(b)(1), unless the authorization is terminated or revoked.  Performed at [facility], [address] [place], [place] [address]   MRSA Next Gen by PCR, Nasal     Status: None   Collection Time: 03/25/21 12:30 AM   Specimen: Nasal Mucosa; Nasal Swab  Result Value Ref Range Status   MRSA by PCR Next Gen NOT DETECTED NOT DETECTED Final    Comment: (NOTE) The GeneXpert MRSA Assay (FDA approved for NASAL specimens only), is one component of a comprehensive MRSA colonization surveillance program. It is not intended to diagnose MRSA infection nor to guide or monitor treatment for MRSA infections. Test performance is not FDA approved in patients less than 79 years old. Performed at [facility], [address] [place], [place] [address]          Radiology Studies: MR ANGIO HEAD WO  CONTRAST  Result Date: 03/27/2021 CLINICAL DATA:  Initial evaluation for neuro deficit, stroke suspected. EXAM: MRA NECK WITHOUT CONTRAST MRA HEAD WITHOUT CONTRAST TECHNIQUE: Multiplanar and multiecho pulse sequences of the neck were obtained without intravenous contrast. Angiographic images of the neck were  obtained using MRA technique without and with intravenous contrast; Angiographic images of the Circle of Willis were obtained using MRA technique without intravenous contrast. COMPARISON:  Prior brain MRI from earlier the same day. FINDINGS: MRA NECK FINDINGS AORTIC ARCH: Examination somewhat technically limited by lack of IV contrast. Aortic arch and origin of the great vessels not assessed on this examination. RIGHT CAROTID SYSTEM: Visualized mid-distal right CCA widely patent with antegrade flow. No significant atheromatous narrowing about the right carotid bifurcation. Right ICA patent distally without stenosis, evidence for dissection or occlusion. LEFT CAROTID SYSTEM: Visualized left CCA patent to the bifurcation without stenosis. No significant atheromatous narrowing about the left bifurcation. Left ICA patent distally without stenosis, dissection or occlusion. VERTEBRAL ARTERIES: Neither vertebral artery origin visualized on this exam. Right vertebral artery slightly dominant. Visualized vertebral arteries patent with antegrade flow without stenosis, evidence for dissection or occlusion. Right vertebral artery dominant. MRA HEAD FINDINGS ANTERIOR CIRCULATION: Examination mildly degraded by motion artifact. Visualized distal cervical segments of the internal carotid arteries are patent with antegrade flow. Petrous, cavernous, and supraclinoid segments patent without stenosis or other abnormality. A1 segments widely patent. Normal anterior communicating artery complex. Anterior cerebral arteries patent to their distal aspects without stenosis. No M1 stenosis or occlusion. Normal MCA bifurcations. Distal  MCA branches perfused and symmetric. POSTERIOR CIRCULATION: Right vertebral artery dominant and patent to the vertebrobasilar junction without stenosis. Right PICA origin patent. Diminutive left vertebral artery largely terminates in PICA, although a small branch a sense or the vertebrobasilar junction. Left PICA patent as well. Basilar somewhat diminutive. Apparent mild narrowing about the mid basilar artery favored to be artifactual in nature. Basilar otherwise patent to its distal aspect. Superior cerebellar arteries patent bilaterally. Predominant fetal type origin of the PCAs, both of which are well perfused to their distal aspects without stenosis. No intracranial aneurysm or other vascular abnormality. IMPRESSION: 1. Negative MRA of the neck. No hemodynamically significant stenosis or other acute vascular abnormality. 2. Negative intracranial MRA. No large vessel occlusion. No hemodynamically significant stenosis. 3. Predominant fetal type origin of the PCAs with overall diminutive vertebrobasilar system. Electronically Signed   By: Jeannine Boga M.D.   On: 03/27/2021 04:08   MR ANGIO NECK WO CONTRAST  Result Date: 03/27/2021 CLINICAL DATA:  Initial evaluation for neuro deficit, stroke suspected. EXAM: MRA NECK WITHOUT CONTRAST MRA HEAD WITHOUT CONTRAST TECHNIQUE: Multiplanar and multiecho pulse sequences of the neck were obtained without intravenous contrast. Angiographic images of the neck were obtained using MRA technique without and with intravenous contrast; Angiographic images of the Circle of Willis were obtained using MRA technique without intravenous contrast. COMPARISON:  Prior brain MRI from earlier the same day. FINDINGS: MRA NECK FINDINGS AORTIC ARCH: Examination somewhat technically limited by lack of IV contrast. Aortic arch and origin of the great vessels not assessed on this examination. RIGHT CAROTID SYSTEM: Visualized mid-distal right CCA widely patent with antegrade flow. No  significant atheromatous narrowing about the right carotid bifurcation. Right ICA patent distally without stenosis, evidence for dissection or occlusion. LEFT CAROTID SYSTEM: Visualized left CCA patent to the bifurcation without stenosis. No significant atheromatous narrowing about the left bifurcation. Left ICA patent distally without stenosis, dissection or occlusion. VERTEBRAL ARTERIES: Neither vertebral artery origin visualized on this exam. Right vertebral artery slightly dominant. Visualized vertebral arteries patent with antegrade flow without stenosis, evidence for dissection or occlusion. Right vertebral artery dominant. MRA HEAD FINDINGS ANTERIOR CIRCULATION: Examination mildly degraded by motion artifact. Visualized distal cervical segments of  the internal carotid arteries are patent with antegrade flow. Petrous, cavernous, and supraclinoid segments patent without stenosis or other abnormality. A1 segments widely patent. Normal anterior communicating artery complex. Anterior cerebral arteries patent to their distal aspects without stenosis. No M1 stenosis or occlusion. Normal MCA bifurcations. Distal MCA branches perfused and symmetric. POSTERIOR CIRCULATION: Right vertebral artery dominant and patent to the vertebrobasilar junction without stenosis. Right PICA origin patent. Diminutive left vertebral artery largely terminates in PICA, although a small branch a sense or the vertebrobasilar junction. Left PICA patent as well. Basilar somewhat diminutive. Apparent mild narrowing about the mid basilar artery favored to be artifactual in nature. Basilar otherwise patent to its distal aspect. Superior cerebellar arteries patent bilaterally. Predominant fetal type origin of the PCAs, both of which are well perfused to their distal aspects without stenosis. No intracranial aneurysm or other vascular abnormality. IMPRESSION: 1. Negative MRA of the neck. No hemodynamically significant stenosis or other acute  vascular abnormality. 2. Negative intracranial MRA. No large vessel occlusion. No hemodynamically significant stenosis. 3. Predominant fetal type origin of the PCAs with overall diminutive vertebrobasilar system. Electronically Signed   By: Jeannine Boga M.D.   On: 03/27/2021 04:08   MR BRAIN WO CONTRAST  Result Date: 03/26/2021 CLINICAL DATA:  Follow-up examination for intracranial hemorrhage, headache. Seizure. EXAM: MRI HEAD WITHOUT CONTRAST TECHNIQUE: Multiplanar, multiecho pulse sequences of the brain and surrounding structures were obtained without intravenous contrast. COMPARISON:  Head CT from 03/25/2021. FINDINGS: Brain: Cerebral volume within normal limits. Mild chronic microvascular ischemic disease noted involving the periventricular and deep white matter both cerebral hemispheres as well as the pons. Small remote lacunar infarcts present at the left frontal corona radiata and right pons. Punctate 3 mm focus of restricted diffusion seen involving the right basal ganglia, consistent with an acute ischemic infarct (series 5, image 24). No associated hemorrhage or mass effect. No other evidence for acute or subacute ischemia. Gray-white matter differentiation otherwise maintained. Previously identified bilateral extra-axial hemorrhages again seen. Hemorrhage overlying the right cerebral convexity measures up to 7-8 mm in thickness, while the hemorrhage overlying the left cerebral convexity measures 2-3 mm in thickness. These are relatively stable in size from prior CT. The right-sided collection demonstrates mild mass effect on the subjacent right cerebral hemisphere without significant midline shift. No hydrocephalus or trapping. Basilar cisterns remain patent. No mass lesion or mass effect elsewhere within the brain. Pituitary gland suprasellar region normal. Midline structures intact and normal. Vascular: Major intracranial vascular flow voids are maintained. Skull and upper cervical spine:  Craniocervical junction within normal limits. Bone marrow signal intensity within normal limits. No scalp soft tissue abnormality. Sinuses/Orbits: Globes orbital soft tissues demonstrate no acute finding. Paranasal sinuses are largely clear. No significant mastoid effusion. Inner ear structures grossly normal. Other: None. IMPRESSION: 1. 3 mm acute ischemic nonhemorrhagic infarct involving the right basal ganglia. 2. Stable bilateral extra-axial hemorrhages, measuring up to 7-8 mm on the right and 2-3 mm in thickness on the left. No significant midline shift. 3. Underlying mild chronic microvascular ischemic disease with small remote lacunar infarcts involving the left frontal corona radiata and right pons. Electronically Signed   By: Jeannine Boga M.D.   On: 03/26/2021 04:30   DG Chest Port 1 View  Result Date: 03/25/2021 CLINICAL DATA:  Central line placement EXAM: PORTABLE CHEST 1 VIEW COMPARISON:  03/25/2021 at 0010 hours FINDINGS: Low lung volumes. Lungs are clear. No pleural effusion or pneumothorax. The heart is normal in size. Left IJ  venous catheter terminates in the mid SVC. IMPRESSION: Left IJ venous catheter terminates in the mid SVC. Electronically Signed   By: Julian Hy M.D.   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  EEG to evaluate for seizure. Level of alertness: Awake, asleep AEDs during EEG study: LEV Technical aspects: This EEG study was done with scalp electrodes positioned according to the 10-20 International system of electrode placement.  Electrical activity was acquired at a sampling rate of 500Hz  and reviewed with a high frequency filter of 70Hz  and a low frequency filter of 1Hz . EEG data were recorded continuously and digitally stored. Description: The posterior dominant rhythm consists of 8Hz  activity of moderate voltage (25-35 uV) seen predominantly in posterior head regions, symmetric and reactive to eye opening and eye closing. Sleep was characterized by vertex waves, frontocentral region. Physiologic photic driving was not seen during photic stimulation.  Hyperventilation was not performed.   IMPRESSION: This study is within normal limits. No seizures or epileptiform discharges were seen throughout the recording. Lora Havens   ECHOCARDIOGRAM COMPLETE BUBBLE STUDY  Result Date: 03/27/2021    ECHOCARDIOGRAM REPORT   Patient Name:   Chase Rodriguez Date of Exam: 03/27/2021 Medical Rec #:  [id number]       Height:       71.0 in Accession #:    [id number]      Weight:       227.7 lb Date of Birth:  May 29, 1960      BSA:          2.228 m Patient Age:    54 years        BP:           150/70 mmHg Patient Gender: M               HR:           101 bpm. Exam Location:  [facility] Procedure: 2D Echo, Cardiac Doppler, Color Doppler, Strain Analysis and Saline            Contrast Bubble Study Indications:     Stroke 434.91 /I63.9  History:         Patient has no prior history of Echocardiogram examinations.                  Risk Factors:Hypertension and Diabetes. CKD stage 3b.  Sonographer:     Sherrie Sport Referring Phys:  [id number] Arzu Mcgaughey J Letti Towell Diagnosing Phys: Nelva Bush MD  Sonographer Comments: Global longitudinal strain was attempted. IMPRESSIONS  1. Left ventricular ejection fraction, by estimation, is 60 to 65%. The left ventricle has normal function. The left ventricle has no regional wall motion abnormalities. There is mild left ventricular hypertrophy. Left ventricular diastolic parameters are consistent with Grade I diastolic dysfunction  (impaired relaxation). Elevated left atrial pressure.  2. Right ventricular systolic function is normal. The right ventricular size is normal. Tricuspid regurgitation signal is inadequate for assessing PA pressure.  3. The mitral valve is normal in structure. No evidence of mitral valve regurgitation. No evidence of mitral stenosis.  4. The aortic  valve has an indeterminant number of cusps. There is moderate calcification of the aortic valve. There is moderate thickening of the aortic valve. Aortic valve regurgitation is not visualized. Mild to moderate aortic valve sclerosis/calcification is present, without any evidence of aortic stenosis.  5. Agitated saline contrast bubble study was negative, with no evidence of any interatrial shunt. FINDINGS  Left Ventricle: Left ventricular ejection fraction, by estimation, is 60 to 65%. The left ventricle has normal function. The left ventricle has no regional wall motion abnormalities. Global longitudinal strain performed but not reported based on interpreter judgement due to suboptimal tracking. The left ventricular internal cavity size was normal in size. There is mild left ventricular hypertrophy. Left ventricular diastolic parameters are consistent with Grade I diastolic dysfunction (impaired relaxation). Elevated left atrial pressure. Right Ventricle: The right ventricular size is normal. No increase in right ventricular wall thickness. Right ventricular systolic function is normal. Tricuspid regurgitation signal is inadequate for assessing PA pressure. Left Atrium: Left atrial size was normal in size. Right Atrium: Right atrial size was normal in size. Pericardium: The pericardium was not well visualized. Mitral Valve: The mitral valve is normal in structure. No evidence of mitral valve regurgitation. No evidence of mitral valve stenosis. MV peak gradient, 3.6 mmHg. The mean mitral valve gradient is 2.0 mmHg. Tricuspid Valve: The tricuspid valve is normal in  structure. Tricuspid valve regurgitation is trivial. Aortic Valve: The aortic valve has an indeterminant number of cusps. There is moderate calcification of the aortic valve. There is moderate thickening of the aortic valve. Aortic valve regurgitation is not visualized. Mild to moderate aortic valve sclerosis/calcification is present, without any evidence of aortic stenosis. Aortic valve mean gradient measures 6.0 mmHg. Aortic valve peak gradient measures 8.4 mmHg. Aortic valve area, by VTI measures 3.47 cm. Pulmonic Valve: The pulmonic valve was not well visualized. Pulmonic valve regurgitation is not visualized. No evidence of pulmonic stenosis. Aorta: The aortic root is normal in size and structure. Pulmonary Artery: The pulmonary artery is of normal size. IAS/Shunts: The interatrial septum was not well visualized. Agitated saline contrast was given intravenously to evaluate for intracardiac shunting. Agitated saline contrast bubble study was negative, with no evidence of any interatrial shunt.  LEFT VENTRICLE PLAX 2D LVIDd:         4.70 cm   Diastology LVIDs:         3.00 cm   LV e' medial:    4.57 cm/s LV PW:         1.30 cm   LV E/e' medial:  15.0 LV IVS:        1.05 cm   LV e' lateral:   7.40 cm/s LVOT diam:     2.20 cm   LV E/e' lateral: 9.3 LV SV:         90 LV SV Index:   40 LVOT Area:     3.80 cm                           3D Volume EF:                          3D EF:        56 %                          LV EDV:       105 ml  LV ESV:       46 ml                          LV SV:        58 ml RIGHT VENTRICLE RV Basal diam:  3.00 cm RV S prime:     16.30 cm/s TAPSE (M-mode): 2.7 cm LEFT ATRIUM             Index        RIGHT ATRIUM           Index LA diam:        3.90 cm 1.75 cm/m   RA Area:     13.10 cm LA Vol (A2C):   70.5 ml 31.64 ml/m  RA Volume:   28.10 ml  12.61 ml/m LA Vol (A4C):   38.0 ml 17.05 ml/m LA Biplane Vol: 55.0 ml 24.68 ml/m  AORTIC VALVE                      PULMONIC VALVE AV Area (Vmax):    3.09 cm      PV Vmax:        0.90 m/s AV Area (Vmean):   2.42 cm      PV Vmean:       59.150 cm/s AV Area (VTI):     3.47 cm      PV VTI:         0.152 m AV Vmax:           145.00 cm/s   PV Peak grad:   3.2 mmHg AV Vmean:          115.000 cm/s  PV Mean grad:   2.0 mmHg AV VTI:            0.260 m       RVOT Peak grad: 6 mmHg AV Peak Grad:      8.4 mmHg AV Mean Grad:      6.0 mmHg LVOT Vmax:         118.00 cm/s LVOT Vmean:        73.100 cm/s LVOT VTI:          0.237 m LVOT/AV VTI ratio: 0.91  AORTA Ao Root diam: 3.10 cm MITRAL VALVE MV Area (PHT): 5.84 cm    SHUNTS MV Area VTI:   4.79 cm    Systemic VTI:  0.24 m MV Peak grad:  3.6 mmHg    Systemic Diam: 2.20 cm MV Mean grad:  2.0 mmHg    Pulmonic VTI:  0.194 m MV Vmax:       0.95 m/s MV Vmean:      74.9 cm/s MV Decel Time: 130 msec MV E velocity: 68.60 cm/s MV A velocity: 89.50 cm/s MV E/A ratio:  0.77 Christopher End MD Electronically signed by Nelva Bush MD Signature Date/Time: 03/27/2021/12:32:00 PM    Final         Scheduled Meds:  amLODipine  10 mg Oral Daily   atorvastatin  80 mg Oral Daily   Chlorhexidine Gluconate Cloth  6 each Topical Daily   influenza vac split quadrivalent PF  0.5 mL Intramuscular Tomorrow-1000   insulin aspart  0-9 Units Subcutaneous Q4H   insulin aspart  8 Units Subcutaneous TID WC   insulin detemir  15 Units Subcutaneous BID   labetalol  150 mg Oral TID   lisinopril  40 mg Oral Daily   Continuous Infusions:  lactated ringers with kcl 75 mL/hr at 03/27/21 1754   levETIRAcetam Stopped (03/27/21 0838)     LOS: 3 days   The patient is critically ill with multiple organ systems failure and requires high complexity decision making for assessment and support, frequent evaluation and titration of therapies, application of advanced monitoring technologies and extensive interpretation of multiple databases. Critical Care Time devoted to patient care services described in this note   Time spent: 40 minutes     Kryssa Risenhoover, Geraldo Docker, MD [facility]   If 7PM-7AM, please contact night-coverage 03/27/2021, 7:06 PM

## 2021-03-27 NOTE — Progress Notes (Signed)
Chaplain Maggie made initial visit at bedside with pt and his brother. Pt's brother shared that he would like to speak with physician to learn about plan of care. He noted pt spoke with doctor earlier, but everything was not understood.  Chaplain expects to follow up.

## 2021-03-27 NOTE — Procedures (Addendum)
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  EEG to evaluate for seizure.  Level of alertness: Awake, asleep  AEDs during EEG study: LEV  Technical aspects: This EEG study was done with scalp electrodes positioned according to the 10-20 International system of electrode placement. Electrical activity was acquired at a sampling rate of 500Hz  and reviewed with a high frequency filter of 70Hz  and a low frequency filter of 1Hz . EEG data were recorded continuously and digitally stored.   Description: The posterior dominant rhythm consists of 8Hz  activity of moderate voltage (25-35 uV) seen predominantly in posterior head regions, symmetric and reactive to eye opening and eye closing. Sleep was characterized by vertex waves, frontocentral region. Physiologic photic driving was not seen during photic stimulation.  Hyperventilation was not performed.     IMPRESSION: This study is within normal limits. No seizures or epileptiform discharges were seen throughout the recording.  [name]

## 2021-03-27 NOTE — Progress Notes (Signed)
*  PRELIMINARY RESULTS* Echocardiogram 2D Echocardiogram has been performed.  Chase Rodriguez 03/27/2021, 8:57 AM

## 2021-03-27 NOTE — Progress Notes (Signed)
   Progress Note   Date: 03/27/2021  History of Present Illness: Chase Rodriguez is a 60 y.o. male who presents with the chief complaint of yncopal episode/seizure.    According to girlfriend, patent was sitting on couch and eating food and then slumped over and had jerking of his right arm and incontinence.  He fell to the ground striking the right side of his head on the carpeted floor.  She reported possibly as much as 30 minutes of this right arm seizure activity & unresponsive state prior to self resolving.  He was slow to recover and seemed confused.  Currently, he feels that he is at his neurological baseline.  No headaches.  No weakness.  No nausea or vomiting.     Interval Events: 10/31: Patient has had repeat imaging showing stable extra-axial hematoma. He has no complaints of headache today.   Vital Signs: Temp:  [97.8 F (36.6 C)-100 F (37.8 C)] 100 F (37.8 C) (10/31 1200) Pulse Rate:  [85-104] 93 (10/31 1230) Resp:  [12-32] 18 (10/31 1200) BP: (114-179)/(62-144) 140/76 (10/31 1230) SpO2:  [84 %-100 %] 92 % (10/31 1230) Weight:  [103.3 kg] 103.3 kg (10/31 0455) Temp (24hrs), Avg:98.9 F (37.2 C), Min:97.8 F (36.6 C), Max:100 F (37.8 C)  Weight: 103.3 kg   Problem List Patient Active Problem List   Diagnosis Date Noted   Epidural hematoma    Seizure ([place]) 03/24/2021    Medications: Scheduled Meds:  amLODipine  10 mg Oral Daily   atorvastatin  80 mg Oral Daily   Chlorhexidine Gluconate Cloth  6 each Topical Daily   influenza vac split quadrivalent PF  0.5 mL Intramuscular Tomorrow-1000   insulin aspart  0-9 Units Subcutaneous Q4H   insulin aspart  8 Units Subcutaneous TID WC   insulin detemir  15 Units Subcutaneous BID   labetalol  150 mg Oral TID   lisinopril  40 mg Oral Daily   Continuous Infusions:  lactated ringers with kcl 75 mL/hr at 03/27/21 0853   levETIRAcetam Stopped (03/27/21 0838)   potassium chloride 10 mEq (03/27/21 1452)   PRN  Meds:.dextrose, docusate sodium, hydrALAZINE, labetalol, LORazepam, polyethylene glycol  Labs:  Lab Results  Component Value Date   WBC 7.0 03/27/2021   WBC 9.9 03/26/2021   HCT 37.2 (L) 03/27/2021   HCT 38.3 (L) 03/26/2021   PLT 239 03/27/2021   PLT 228 03/26/2021    Lab Results  Component Value Date   INR 1.0 03/24/2021   APTT 37 (H) 03/24/2021    Lab Results  Component Value Date   NA 141 03/27/2021   NA 138 03/26/2021   K 3.1 (L) 03/27/2021   K 2.8 (L) 03/26/2021   BUN 11 03/27/2021   BUN 13 03/26/2021    Lab Results  Component Value Date   MG 1.3 (L) 03/27/2021   Imaging:   CT Head (10/29):Stable right frontotemporal epidural hematoma with associated mild mass effect upon the right cerebral hemisphere.Stable small left probable subdural hematoma without significant associated mass effect.   A/P: - No inpatient treatment needed from surgical standpoint - Can continue Keppra, defer to neurology for seizure management - No further imaging needed at this time, can follow up as outpatient in 2-3 weeks  Deetta Perla, MD Neurosurgery

## 2021-03-28 LAB — CBC WITH DIFFERENTIAL/PLATELET
Abs Immature Granulocytes: 0.02 10*3/uL (ref 0.00–0.07)
Basophils Absolute: 0 10*3/uL (ref 0.0–0.1)
Basophils Relative: 0 %
Eosinophils Absolute: 0.1 10*3/uL (ref 0.0–0.5)
Eosinophils Relative: 1 %
HCT: 36.4 % — ABNORMAL LOW (ref 39.0–52.0)
Hemoglobin: 12 g/dL — ABNORMAL LOW (ref 13.0–17.0)
Immature Granulocytes: 0 %
Lymphocytes Relative: 34 %
Lymphs Abs: 2.5 10*3/uL (ref 0.7–4.0)
MCH: 27.6 pg (ref 26.0–34.0)
MCHC: 33 g/dL (ref 30.0–36.0)
MCV: 83.7 fL (ref 80.0–100.0)
Monocytes Absolute: 0.8 10*3/uL (ref 0.1–1.0)
Monocytes Relative: 11 %
Neutro Abs: 4 10*3/uL (ref 1.7–7.7)
Neutrophils Relative %: 54 %
Platelets: 228 10*3/uL (ref 150–400)
RBC: 4.35 MIL/uL (ref 4.22–5.81)
RDW: 14 % (ref 11.5–15.5)
WBC: 7.3 10*3/uL (ref 4.0–10.5)
nRBC: 0 % (ref 0.0–0.2)

## 2021-03-28 LAB — COMPREHENSIVE METABOLIC PANEL
ALT: 12 U/L (ref 0–44)
AST: 35 U/L (ref 15–41)
Albumin: 2.4 g/dL — ABNORMAL LOW (ref 3.5–5.0)
Alkaline Phosphatase: 49 U/L (ref 38–126)
Anion gap: 7 (ref 5–15)
BUN: 11 mg/dL (ref 6–20)
CO2: 25 mmol/L (ref 22–32)
Calcium: 6.6 mg/dL — ABNORMAL LOW (ref 8.9–10.3)
Chloride: 109 mmol/L (ref 98–111)
Creatinine, Ser: 1.97 mg/dL — ABNORMAL HIGH (ref 0.61–1.24)
GFR, Estimated: 39 mL/min — ABNORMAL LOW (ref >60)
Glucose, Bld: 136 mg/dL — ABNORMAL HIGH (ref 70–99)
Potassium: 3.3 mmol/L — ABNORMAL LOW (ref 3.5–5.1)
Sodium: 141 mmol/L (ref 135–145)
Total Bilirubin: 0.6 mg/dL (ref 0.3–1.2)
Total Protein: 5.6 g/dL — ABNORMAL LOW (ref 6.5–8.1)

## 2021-03-28 LAB — GLUCOSE, CAPILLARY
Glucose-Capillary: 118 mg/dL — ABNORMAL HIGH (ref 70–99)
Glucose-Capillary: 132 mg/dL — ABNORMAL HIGH (ref 70–99)
Glucose-Capillary: 366 mg/dL — ABNORMAL HIGH (ref 70–99)
Glucose-Capillary: 164 mg/dL — ABNORMAL HIGH (ref 70–99)
Glucose-Capillary: 138 mg/dL — ABNORMAL HIGH (ref 70–99)
Glucose-Capillary: 100 mg/dL — ABNORMAL HIGH (ref 70–99)

## 2021-03-28 LAB — MAGNESIUM: Magnesium: 1.2 mg/dL — ABNORMAL LOW (ref 1.7–2.4)

## 2021-03-28 LAB — PHOSPHORUS: Phosphorus: 4 mg/dL (ref 2.5–4.6)

## 2021-03-28 MED ORDER — SODIUM CHLORIDE 0.9 % IV SOLN
3.0000 g | Freq: Once | INTRAVENOUS | Status: AC
  Administered 2021-03-28: 3 g via INTRAVENOUS
  Filled 2021-03-28: qty 30

## 2021-03-28 MED ORDER — POTASSIUM CHLORIDE CRYS ER 20 MEQ PO TBCR
40.0000 meq | EXTENDED_RELEASE_TABLET | Freq: Once | ORAL | Status: AC
  Administered 2021-03-28: 40 meq via ORAL
  Filled 2021-03-28: qty 2

## 2021-03-28 MED ORDER — MAGNESIUM SULFATE 4 GM/100ML IV SOLN: 4.0000 g | Freq: Once | INTRAVENOUS | Status: DC

## 2021-03-28 MED ORDER — MAGNESIUM SULFATE 2 GM/50ML IV SOLN
2.0000 g | Freq: Once | INTRAVENOUS | Status: AC
  Administered 2021-03-28: 2 g via INTRAVENOUS
  Filled 2021-03-28: qty 50

## 2021-03-28 MED ORDER — POTASSIUM CHLORIDE 10 MEQ/100ML IV SOLN
10.0000 meq | INTRAVENOUS | Status: DC
Start: 2021-03-28 — End: 2021-03-28
  Filled 2021-03-28 (×4): qty 100

## 2021-03-28 MED ORDER — POTASSIUM CHLORIDE CRYS ER 20 MEQ PO TBCR
50.0000 meq | EXTENDED_RELEASE_TABLET | Freq: Two times a day (BID) | ORAL | Status: AC
  Administered 2021-03-28: 50 meq via ORAL
  Filled 2021-03-28: qty 3

## 2021-03-28 NOTE — Progress Notes (Signed)
PROGRESS NOTE    Chase Rodriguez  GPQ:[id number] DOB: 1960-10-18 DOA: 03/24/2021 PCP: [facility]   Brief Narrative:  60 y.o. BM PMHx DM type II uncontrolled with complication, DM nephropathy, CKD stage IIIb, essential HTN, HLD, Hepatitis  Presents with the chief complaint of yncopal episode/seizure.    According to girlfriend, patent sitting on couch and eating food and then slumped over and had jerking of his right arm and incontinence.  He fell to the ground striking the right side of his head on the carpeted floor.  She reported possibly as much as 30 minutes of this right arm seizure activity & unresponsive state prior to self resolving.  He was slow to recover and seemed confused.  Currently, he feels that he is at his neurological baseline.  No headaches.  No weakness.  No nausea or vomiting.    Subjective: 11/1 A/O x4, negative SOB, negative CP, negative headache, negative vision change   Assessment & Plan:  Covid vaccination; vaccinated 2/3  Active Problems:   Seizure ([facility])   Epidural hematoma   Basal ganglia infarction ([place])   Lacunar infarct, acute ([facility])   Uncontrolled type 2 diabetes mellitus with hyperglycemia ([place])   Essential hypertension   HLD (hyperlipidemia)   Chronic hepatitis C without hepatic coma ([place] [place])   Obese   Acute ischemic basal ganglia infarct/Chronic Lacunar infarct - See results MRI 10/30 below. -Treatment per neurology  LEFT subdural hematoma -Neurochecks q 4 hours - MRI/MRA brain pending -SBP goal<150  Right epidural hematoma -See subdural hematoma and essential HTN  Witnessed Seizure -EEG scheduled for the A.m. - Per Neurology For new onset seizure, continue Keppra at 500 mg BID. Will also need to continue at discharge.  -- Ambulatory referral to outpatient Neurology upon hospital discharge - PT/OT/Speech - Hold antiplatelet agent for now in the setting of subacute epidural and subdural hematoma. - Re-image with  head CT in 2 weeks. If hemorrhages are resolving without evidence for rebleeding, most likely will be safe to restart antiplatelet medication for secondary stroke prevention, given that the hemorrhages were traumatic.  - Ambulatory referral to neurology within 2 weeks of hospital discharge - Neurohospitalist service will sign off.  DM type II uncontrolled with Hyperglycemia - 10/29 Hemoglobin A1c= 12.5 -Levemir 15 units BID -NovoLog 8 units qac -10/31 decrease sensitive SSI  -Seen by diabetic coordinator on 10/30 - Patient requires glucometer.  Have requested one.  DM nephropathy/CKD stage IIIb Lab Results  Component Value Date   CREATININE 1.97 (H) 03/28/2021   CREATININE 1.97 (H) 03/27/2021   CREATININE 2.08 (H) 03/26/2021   CREATININE 2.19 (H) 03/26/2021   CREATININE 2.19 (H) 03/26/2021    Essential HTN -Goal SBP 130-150, no permissive HTN in the setting of subacute epidural and subdural hematoma -Amlodipine 10 mg daily - 11/1 Hydralazine IV PRN  -11/1 Hydralazine 25 mg TID - Labetalol IV PRN -10/31 increase Labetalol 150 mg TID -Lisinopril 40 mg daily  HLD -10/29 LDL= 170: Goal<70 mg/deciliter -Lipitor 80 mg daily  Chronic Hepatitis C without Hepatic Coma. -Unsure if patient received treatment will address in a.m. obviously would not acutely receive treatment now.  Hypokalemia -Potassium goal> 4 -10/31 -11/1 Potassium 50 mEq BID  Hypomagnesmia - Magnesium goal> 2 - 11/1 Magnesium IV 4g  Hypocalcemia -11/1 corrected calcium 7.8 - Calcium gluconate IV 3 g  Obese (BMI 31. 76 kg/m)      DVT prophylaxis: SCD Code Status: Full Family Communication: 10/30 brother and girlfriend at bedside  for discussion of plan of care all questions answered Status is: Inpatient    Dispo: The patient is from: Home              Anticipated d/c is to: Home              Anticipated d/c date is: 3 days              Patient currently is not medically stable to  d/c.      Consultants:  Neurosurgery  Neurology PCCM   Procedures/Significant Events:  10/30 MRI brain  Wo contrast 3 mm acute ischemic nonhemorrhagic infarct involving the right basal ganglia. 2. Stable bilateral extra-axial hemorrhages, measuring up to 7-8 mm on the right and 2-3 mm in thickness on the left. No significant midline shift. 3. Underlying mild chronic microvascular ischemic disease with small remote lacunar infarcts involving the left frontal corona radiata and right pons. 10/30 MRA head/neck ANTERIOR CIRCULATION:   Examination mildly degraded by motion artifact.   Visualized distal cervical segments of the internal carotid arteries are patent with antegrade flow. Petrous, cavernous, and supraclinoid segments patent without stenosis or other abnormality. A1 segments widely patent. Normal anterior communicating artery complex. Anterior cerebral arteries patent to their distal aspects without stenosis. No M1 stenosis or occlusion. Normal MCA bifurcations. Distal MCA branches perfused and symmetric.   POSTERIOR CIRCULATION:   Right vertebral artery dominant and patent to the vertebrobasilar junction without stenosis. Right PICA origin patent. Diminutive left vertebral artery largely terminates in PICA, although a small branch a sense or the vertebrobasilar junction. Left PICA patent as well. Basilar somewhat diminutive. Apparent mild narrowing about the mid basilar artery favored to be artifactual in nature. Basilar otherwise patent to its distal aspect. Superior cerebellar arteries patent bilaterally. Predominant fetal type origin of the PCAs, both of which are well perfused to their distal aspects without stenosis.   No intracranial aneurysm or other vascular abnormality.   IMPRESSION: 1. Negative MRA of the neck. No hemodynamically significant stenosis or other acute vascular abnormality. 2. Negative intracranial MRA. No large vessel occlusion.  No hemodynamically significant stenosis. 3. Predominant fetal type origin of the PCAs with overall diminutive vertebrobasilar system. 10/31 OJJ:[id number] study is within normal limits. No seizures or epileptiform discharges were seen throughout the recording 10/31 Echocardiogram with bubble study:Left Ventricle: Left ventricular ejection fraction, by estimation, is 60  to 65%. The left ventricle has normal function. The left ventricle has no  regional wall motion abnormalities. Global longitudinal strain performed  but not reported based on  interpreter judgement due to suboptimal tracking. The left ventricular  internal cavity size was normal in size. There is mild left ventricular  hypertrophy. Left ventricular diastolic parameters are consistent with  Grade I diastolic dysfunction (impaired  relaxation). Elevated left atrial pressure.   Right Ventricle: The right ventricular size is normal. No increase in  right ventricular wall thickness. Right ventricular systolic function is  normal. Tricuspid regurgitation signal is inadequate for assessing PA  pressure.    Mitral Valve: The mitral valve is normal in structure. No evidence of  mitral valve regurgitation. No evidence of mitral valve stenosis. MV peak  gradient, 3.6 mmHg. The mean mitral valve gradient is 2.0 mmHg.   Tricuspid Valve: The tricuspid valve is normal in structure. Tricuspid  valve regurgitation is trivial.   Aortic Valve: The aortic valve has an indeterminant number of cusps. There  is moderate calcification of the aortic valve. There is moderate  thickening  of the aortic valve. Aortic valve regurgitation is not  visualized. Mild to moderate aortic valve  sclerosis/calcification is present, without any evidence of aortic  stenosis. Aortic valve mean gradient measures 6.0 mmHg. Aortic valve peak  gradient measures 8.4 mmHg. Aortic valve area, by VTI measures 3.47 cm.   Pulmonic Valve: The pulmonic valve was not well  visualized. Pulmonic valve  regurgitation is not visualized. No evidence of pulmonic stenosis.   Aorta: The aortic root is normal in size and structure.   Pulmonary Artery: The pulmonary artery is of normal size.   IAS/Shunts: The interatrial septum was not well visualized. Agitated  saline contrast was given intravenously to evaluate for intracardiac  shunting. Agitated saline contrast bubble study was negative, with no  evidence of any interatrial shunt.     I have personally reviewed and interpreted all radiology studies and my findings are as above.  VENTILATOR SETTINGS:    Cultures 10/28 SARS coronavirus negative 10/28 influenza A/B negative   Antimicrobials:    Devices    LINES / TUBES:      Continuous Infusions:  lactated ringers with kcl 75 mL/hr at 03/28/21 0749   levETIRAcetam 500 mg (03/28/21 0943)   magnesium sulfate bolus IVPB     potassium chloride       Objective: Vitals:   03/28/21 0600 03/28/21 0700 03/28/21 0800 03/28/21 0900  BP: (!) 187/95 (!) 148/91 (!) 162/96 (!) 149/89  Pulse: 95 89 89   Resp: 11 12 16 13   Temp:   (!) 97.5 F (36.4 C)   TempSrc:   Oral   SpO2: 98% 97% 98%   Weight:      Height:        Intake/Output Summary (Last 24 hours) at 03/28/2021 1143 Last data filed at 03/28/2021 3614 Gross per 24 hour  Intake 2339.76 ml  Output 2050 ml  Net 289.76 ml    Filed Weights   03/26/21 0425 03/27/21 0455 03/28/21 0500  Weight: 105.1 kg 103.3 kg 105 kg    Examination:  General: No acute respiratory distress Eyes: negative scleral hemorrhage, negative anisocoria, negative icterus ENT: Negative Runny nose, negative gingival bleeding, Neck:  Negative scars, masses, torticollis, lymphadenopathy, JVD Lungs: Clear to auscultation bilaterally without wheezes or crackles Cardiovascular: Regular rate and rhythm without murmur gallop or rub normal S1 and S2 Abdomen: negative abdominal pain, nondistended, positive soft, bowel  sounds, no rebound, no ascites, no appreciable mass Extremities: No significant cyanosis, clubbing, or edema bilateral lower extremities Skin: Negative rashes, lesions, ulcers Psychiatric:  Negative depression, negative anxiety, negative fatigue, negative mania  Central nervous system:  Cranial nerves II through XII intact, tongue/uvula midline, all extremities muscle strength 5/5, sensation intact throughout, quick finger touch bilateral within normal limits, negative dysarthria, negative expressive aphasia, negative receptive aphasia.  .     Data Reviewed: Care during the described time interval was provided by me .  I have reviewed this patient's available data, including medical history, events of note, physical examination, and all test results as part of my evaluation.   CBC: Recent Labs  Lab 03/24/21 1405 03/25/21 0441 03/26/21 0418 03/27/21 0608 03/28/21 0500  WBC 8.7 8.7 9.9 7.0 7.3  NEUTROABS  --   --   --  4.0 4.0  HGB 15.1 14.2 13.6 12.8* 12.0*  HCT 42.0 39.1 38.3* 37.2* 36.4*  MCV 81.1 80.0 81.3 80.9 83.7  PLT 249 240 228 239 431    Basic Metabolic Panel: Recent Labs  Lab 03/25/21  1958 03/26/21 0007 03/26/21 0418 03/26/21 1414 03/26/21 2107 03/27/21 0608 03/28/21 0500  NA 137   < > 135 138 138 141 141  K 2.5*   < > 3.4* 3.1* 2.8* 3.1* 3.3*  CL 98   < > 99 102 103 105 109  CO2 29   < > 27 27 27 27 25   GLUCOSE 242*   < > 364* 296* 259* 187* 136*  BUN 14   < > 16 15 13 11 11   CREATININE 2.02*   < > 2.19* 2.19* 2.08* 1.97* 1.97*  CALCIUM 7.3*   < > 7.0* 6.7* 6.7* 6.7* 6.6*  MG 1.6*  --  2.1 1.7  --  1.3* 1.2*  PHOS 3.3  --  3.3 2.6  --  2.9 4.0   < > = values in this interval not displayed.    GFR: Estimated Creatinine Clearance: 50.4 mL/min (A) (by C-G formula based on SCr of 1.97 mg/dL (H)). Liver Function Tests: Recent Labs  Lab 03/24/21 2105 03/26/21 1414 03/27/21 0608 03/28/21 0500  AST 21 38 41 35  ALT 12 12 12 12   ALKPHOS 65 64 53 49   BILITOT 0.5 0.5 0.5 0.6  PROT 7.4 5.8* 5.8* 5.6*  ALBUMIN 3.1* 2.5* 2.5* 2.4*    No results for input(s): LIPASE, AMYLASE in the last 168 hours. No results for input(s): AMMONIA in the last 168 hours. Coagulation Profile: Recent Labs  Lab 03/24/21 2105  INR 1.0    Cardiac Enzymes: No results for input(s): CKTOTAL, CKMB, CKMBINDEX, TROPONINI in the last 168 hours. BNP (last 3 results) No results for input(s): PROBNP in the last 8760 hours. HbA1C: No results for input(s): HGBA1C in the last 72 hours.  CBG: Recent Labs  Lab 03/27/21 1928 03/27/21 2337 03/28/21 0313 03/28/21 0743 03/28/21 1117  GLUCAP 246* 143* 118* 132* 366*    Lipid Profile: No results for input(s): CHOL, HDL, LDLCALC, TRIG, CHOLHDL, LDLDIRECT in the last 72 hours.  Thyroid Function Tests: No results for input(s): TSH, T4TOTAL, FREET4, T3FREE, THYROIDAB in the last 72 hours.  Anemia Panel: No results for input(s): VITAMINB12, FOLATE, FERRITIN, TIBC, IRON, RETICCTPCT in the last 72 hours. Urine analysis:    Component Value Date/Time   COLORURINE STRAW (A) 03/24/2021 2053   APPEARANCEUR CLEAR (A) 03/24/2021 2053   LABSPEC 1.016 03/24/2021 2053   PHURINE 6.0 03/24/2021 2053   GLUCOSEU >=500 (A) 03/24/2021 2053   HGBUR MODERATE (A) 03/24/2021 2053   BILIRUBINUR NEGATIVE 03/24/2021 2053   [place] NEGATIVE 03/24/2021 2053   PROTEINUR >=300 (A) 03/24/2021 2053   NITRITE NEGATIVE 03/24/2021 2053   LEUKOCYTESUR NEGATIVE 03/24/2021 2053   Sepsis Labs: @LABRCNTIP (OFHQRFXJOITGP:4,DIYMEBRAXEN:4)  ) Recent Results (from the past 240 hour(s))  Resp Panel by RT-PCR (Flu A&B, Covid) Nasopharyngeal Swab     Status: None   Collection Time: 03/24/21  9:37 PM   Specimen: Nasopharyngeal Swab; Nasopharyngeal(NP) swabs in vial transport medium  Result Value Ref Range Status   SARS Coronavirus 2 by RT PCR NEGATIVE NEGATIVE Final    Comment: (NOTE) SARS-CoV-2 target nucleic acids are NOT DETECTED.  The  SARS-CoV-2 RNA is generally detectable in upper respiratory specimens during the acute phase of infection. The lowest concentration of SARS-CoV-2 viral copies this assay can detect is 138 copies/mL. A negative result does not preclude SARS-Cov-2 infection and should not be used as the sole basis for treatment or other patient management decisions. A negative result may occur with  improper specimen collection/handling, submission of  specimen other than nasopharyngeal swab, presence of viral mutation(s) within the areas targeted by this assay, and inadequate number of viral copies(<138 copies/mL). A negative result must be combined with clinical observations, patient history, and epidemiological information. The expected result is Negative.  Fact Sheet for Patients:  EntrepreneurPulse.com.au  Fact Sheet for Healthcare Providers:  IncredibleEmployment.be  This test is no t yet approved or cleared by the [place] FDA and  has been authorized for detection and/or diagnosis of SARS-CoV-2 by FDA under an Emergency Use Authorization (EUA). This EUA will remain  in effect (meaning this test can be used) for the duration of the COVID-19 declaration under Section 564(b)(1) of the Act, 21 U.S.C.section 360bbb-3(b)(1), unless the authorization is terminated  or revoked sooner.       Influenza A by PCR NEGATIVE NEGATIVE Final   Influenza B by PCR NEGATIVE NEGATIVE Final    Comment: (NOTE) The Xpert Xpress SARS-CoV-2/FLU/RSV plus assay is intended as an aid in the diagnosis of influenza from Nasopharyngeal swab specimens and should not be used as a sole basis for treatment. Nasal washings and aspirates are unacceptable for Xpert Xpress SARS-CoV-2/FLU/RSV testing.  Fact Sheet for Patients: EntrepreneurPulse.com.au  Fact Sheet for Healthcare Providers: IncredibleEmployment.be  This test is not yet approved or  cleared by the [place] FDA and has been authorized for detection and/or diagnosis of SARS-CoV-2 by FDA under an Emergency Use Authorization (EUA). This EUA will remain in effect (meaning this test can be used) for the duration of the COVID-19 declaration under Section 564(b)(1) of the Act, 21 U.S.C. section 360bbb-3(b)(1), unless the authorization is terminated or revoked.  Performed at [facility], [address] [place], [place] [address]   MRSA Next Gen by PCR, Nasal     Status: None   Collection Time: 03/25/21 12:30 AM   Specimen: Nasal Mucosa; Nasal Swab  Result Value Ref Range Status   MRSA by PCR Next Gen NOT DETECTED NOT DETECTED Final    Comment: (NOTE) The GeneXpert MRSA Assay (FDA approved for NASAL specimens only), is one component of a comprehensive MRSA colonization surveillance program. It is not intended to diagnose MRSA infection nor to guide or monitor treatment for MRSA infections. Test performance is not FDA approved in patients less than 71 years old. Performed at [facility], [address] [place], [place] [address]          Radiology Studies: MR ANGIO HEAD WO CONTRAST  Result Date: 03/27/2021 CLINICAL DATA:  Initial evaluation for neuro deficit, stroke suspected. EXAM: MRA NECK WITHOUT CONTRAST MRA HEAD WITHOUT CONTRAST TECHNIQUE: Multiplanar and multiecho pulse sequences of the neck were obtained without intravenous contrast. Angiographic images of the neck were obtained using MRA technique without and with intravenous contrast; Angiographic images of the Circle of Willis were obtained using MRA technique without intravenous contrast. COMPARISON:  Prior brain MRI from earlier the same day. FINDINGS: MRA NECK FINDINGS AORTIC ARCH: Examination somewhat technically limited by lack of IV contrast. Aortic arch and origin of the great vessels not assessed on this examination. RIGHT CAROTID SYSTEM: Visualized mid-distal right CCA  widely patent with antegrade flow. No significant atheromatous narrowing about the right carotid bifurcation. Right ICA patent distally without stenosis, evidence for dissection or occlusion. LEFT CAROTID SYSTEM: Visualized left CCA patent to the bifurcation without stenosis. No significant atheromatous narrowing about the left bifurcation. Left ICA patent distally without stenosis, dissection or occlusion. VERTEBRAL ARTERIES: Neither vertebral artery origin visualized on this exam. Right  vertebral artery slightly dominant. Visualized vertebral arteries patent with antegrade flow without stenosis, evidence for dissection or occlusion. Right vertebral artery dominant. MRA HEAD FINDINGS ANTERIOR CIRCULATION: Examination mildly degraded by motion artifact. Visualized distal cervical segments of the internal carotid arteries are patent with antegrade flow. Petrous, cavernous, and supraclinoid segments patent without stenosis or other abnormality. A1 segments widely patent. Normal anterior communicating artery complex. Anterior cerebral arteries patent to their distal aspects without stenosis. No M1 stenosis or occlusion. Normal MCA bifurcations. Distal MCA branches perfused and symmetric. POSTERIOR CIRCULATION: Right vertebral artery dominant and patent to the vertebrobasilar junction without stenosis. Right PICA origin patent. Diminutive left vertebral artery largely terminates in PICA, although a small branch a sense or the vertebrobasilar junction. Left PICA patent as well. Basilar somewhat diminutive. Apparent mild narrowing about the mid basilar artery favored to be artifactual in nature. Basilar otherwise patent to its distal aspect. Superior cerebellar arteries patent bilaterally. Predominant fetal type origin of the PCAs, both of which are well perfused to their distal aspects without stenosis. No intracranial aneurysm or other vascular abnormality. IMPRESSION: 1. Negative MRA of the neck. No hemodynamically  significant stenosis or other acute vascular abnormality. 2. Negative intracranial MRA. No large vessel occlusion. No hemodynamically significant stenosis. 3. Predominant fetal type origin of the PCAs with overall diminutive vertebrobasilar system. Electronically Signed   By: Jeannine Boga M.D.   On: 03/27/2021 04:08   MR ANGIO NECK WO CONTRAST  Result Date: 03/27/2021 CLINICAL DATA:  Initial evaluation for neuro deficit, stroke suspected. EXAM: MRA NECK WITHOUT CONTRAST MRA HEAD WITHOUT CONTRAST TECHNIQUE: Multiplanar and multiecho pulse sequences of the neck were obtained without intravenous contrast. Angiographic images of the neck were obtained using MRA technique without and with intravenous contrast; Angiographic images of the Circle of Willis were obtained using MRA technique without intravenous contrast. COMPARISON:  Prior brain MRI from earlier the same day. FINDINGS: MRA NECK FINDINGS AORTIC ARCH: Examination somewhat technically limited by lack of IV contrast. Aortic arch and origin of the great vessels not assessed on this examination. RIGHT CAROTID SYSTEM: Visualized mid-distal right CCA widely patent with antegrade flow. No significant atheromatous narrowing about the right carotid bifurcation. Right ICA patent distally without stenosis, evidence for dissection or occlusion. LEFT CAROTID SYSTEM: Visualized left CCA patent to the bifurcation without stenosis. No significant atheromatous narrowing about the left bifurcation. Left ICA patent distally without stenosis, dissection or occlusion. VERTEBRAL ARTERIES: Neither vertebral artery origin visualized on this exam. Right vertebral artery slightly dominant. Visualized vertebral arteries patent with antegrade flow without stenosis, evidence for dissection or occlusion. Right vertebral artery dominant. MRA HEAD FINDINGS ANTERIOR CIRCULATION: Examination mildly degraded by motion artifact. Visualized distal cervical segments of the internal  carotid arteries are patent with antegrade flow. Petrous, cavernous, and supraclinoid segments patent without stenosis or other abnormality. A1 segments widely patent. Normal anterior communicating artery complex. Anterior cerebral arteries patent to their distal aspects without stenosis. No M1 stenosis or occlusion. Normal MCA bifurcations. Distal MCA branches perfused and symmetric. POSTERIOR CIRCULATION: Right vertebral artery dominant and patent to the vertebrobasilar junction without stenosis. Right PICA origin patent. Diminutive left vertebral artery largely terminates in PICA, although a small branch a sense or the vertebrobasilar junction. Left PICA patent as well. Basilar somewhat diminutive. Apparent mild narrowing about the mid basilar artery favored to be artifactual in nature. Basilar otherwise patent to its distal aspect. Superior cerebellar arteries patent bilaterally. Predominant fetal type origin of the PCAs, both of which are  well perfused to their distal aspects without stenosis. No intracranial aneurysm or other vascular abnormality. IMPRESSION: 1. Negative MRA of the neck. No hemodynamically significant stenosis or other acute vascular abnormality. 2. Negative intracranial MRA. No large vessel occlusion. No hemodynamically significant stenosis. 3. Predominant fetal type origin of the PCAs with overall diminutive vertebrobasilar system. Electronically Signed   By: Jeannine Boga M.D.   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  EEG to evaluate for seizure.  Level of alertness: Awake, asleep AEDs during EEG study: LEV Technical aspects: This EEG study was done with scalp electrodes positioned according to the 10-20 International system of electrode placement. Electrical activity was acquired at a sampling rate of 500Hz  and reviewed with a high frequency filter of 70Hz  and a low frequency filter of 1Hz . EEG data were recorded continuously and digitally stored. Description: The posterior dominant rhythm consists of 8Hz  activity of moderate voltage (25-35 uV) seen predominantly in posterior head regions, symmetric and reactive to eye opening and eye closing. Sleep was characterized by vertex waves, frontocentral region. Physiologic photic driving was not seen during photic stimulation.  Hyperventilation was not performed.   IMPRESSION: This study is within normal limits. No seizures or epileptiform discharges were seen throughout the recording. Lora Havens   ECHOCARDIOGRAM COMPLETE BUBBLE STUDY  Result Date: 03/27/2021    ECHOCARDIOGRAM REPORT   Patient Name:   Chase Rodriguez Date of Exam: 03/27/2021 Medical Rec #:  [id number]       Height:       71.0 in Accession #:    [id number]      Weight:       227.7 lb Date of Birth:  05-11-1961      BSA:          2.228 m Patient Age:    53 years        BP:           150/70 mmHg Patient Gender: M               HR:           101 bpm. Exam Location:  [facility] Procedure: 2D Echo, Cardiac Doppler, Color Doppler, Strain Analysis and Saline            Contrast Bubble Study Indications:     Stroke 434.91 /I63.9  History:         Patient has no prior history of Echocardiogram examinations.                  Risk Factors:Hypertension and Diabetes. CKD stage 3b.  Sonographer:     Sherrie Sport Referring Phys:  [id number] Torunn Chancellor J Lenzy Kerschner Diagnosing Phys: Nelva Bush MD  Sonographer Comments: Global longitudinal strain was attempted. IMPRESSIONS  1. Left ventricular ejection fraction, by estimation, is 60 to 65%. The left ventricle has normal  function. The left ventricle has no regional wall motion abnormalities. There is mild left ventricular hypertrophy. Left ventricular diastolic parameters are consistent with Grade I diastolic dysfunction (impaired relaxation). Elevated left atrial pressure.  2. Right ventricular systolic function is normal.  The right ventricular size is normal. Tricuspid regurgitation signal is inadequate for assessing PA pressure.  3. The mitral valve is normal in structure. No evidence of mitral valve regurgitation. No evidence of mitral stenosis.  4. The aortic valve has an indeterminant number of cusps. There is moderate calcification of the aortic valve. There is moderate thickening of the aortic valve. Aortic valve regurgitation is not visualized. Mild to moderate aortic valve sclerosis/calcification is present, without any evidence of aortic stenosis.  5. Agitated saline contrast bubble study was negative, with no evidence of any interatrial shunt. FINDINGS  Left Ventricle: Left ventricular ejection fraction, by estimation, is 60 to 65%. The left ventricle has normal function. The left ventricle has no regional wall motion abnormalities. Global longitudinal strain performed but not reported based on interpreter judgement due to suboptimal tracking. The left ventricular internal cavity size was normal in size. There is mild left ventricular hypertrophy. Left ventricular diastolic parameters are consistent with Grade I diastolic dysfunction (impaired relaxation). Elevated left atrial pressure. Right Ventricle: The right ventricular size is normal. No increase in right ventricular wall thickness. Right ventricular systolic function is normal. Tricuspid regurgitation signal is inadequate for assessing PA pressure. Left Atrium: Left atrial size was normal in size. Right Atrium: Right atrial size was normal in size. Pericardium: The pericardium was not well visualized. Mitral Valve: The mitral valve is normal in structure. No  evidence of mitral valve regurgitation. No evidence of mitral valve stenosis. MV peak gradient, 3.6 mmHg. The mean mitral valve gradient is 2.0 mmHg. Tricuspid Valve: The tricuspid valve is normal in structure. Tricuspid valve regurgitation is trivial. Aortic Valve: The aortic valve has an indeterminant number of cusps. There is moderate calcification of the aortic valve. There is moderate thickening of the aortic valve. Aortic valve regurgitation is not visualized. Mild to moderate aortic valve sclerosis/calcification is present, without any evidence of aortic stenosis. Aortic valve mean gradient measures 6.0 mmHg. Aortic valve peak gradient measures 8.4 mmHg. Aortic valve area, by VTI measures 3.47 cm. Pulmonic Valve: The pulmonic valve was not well visualized. Pulmonic valve regurgitation is not visualized. No evidence of pulmonic stenosis. Aorta: The aortic root is normal in size and structure. Pulmonary Artery: The pulmonary artery is of normal size. IAS/Shunts: The interatrial septum was not well visualized. Agitated saline contrast was given intravenously to evaluate for intracardiac shunting. Agitated saline contrast bubble study was negative, with no evidence of any interatrial shunt.  LEFT VENTRICLE PLAX 2D LVIDd:         4.70 cm   Diastology LVIDs:         3.00 cm   LV e' medial:    4.57 cm/s LV PW:         1.30 cm   LV E/e' medial:  15.0 LV IVS:        1.05 cm   LV e' lateral:   7.40 cm/s LVOT diam:     2.20 cm   LV E/e' lateral: 9.3 LV SV:         90 LV SV Index:   40 LVOT Area:     3.80 cm                           3D Volume EF:                          3D EF:  56 %                          LV EDV:       105 ml                          LV ESV:       46 ml                          LV SV:        58 ml RIGHT VENTRICLE RV Basal diam:  3.00 cm RV S prime:     16.30 cm/s TAPSE (M-mode): 2.7 cm LEFT ATRIUM             Index        RIGHT ATRIUM           Index LA diam:        3.90 cm 1.75 cm/m   RA  Area:     13.10 cm LA Vol (A2C):   70.5 ml 31.64 ml/m  RA Volume:   28.10 ml  12.61 ml/m LA Vol (A4C):   38.0 ml 17.05 ml/m LA Biplane Vol: 55.0 ml 24.68 ml/m  AORTIC VALVE                     PULMONIC VALVE AV Area (Vmax):    3.09 cm      PV Vmax:        0.90 m/s AV Area (Vmean):   2.42 cm      PV Vmean:       59.150 cm/s AV Area (VTI):     3.47 cm      PV VTI:         0.152 m AV Vmax:           145.00 cm/s   PV Peak grad:   3.2 mmHg AV Vmean:          115.000 cm/s  PV Mean grad:   2.0 mmHg AV VTI:            0.260 m       RVOT Peak grad: 6 mmHg AV Peak Grad:      8.4 mmHg AV Mean Grad:      6.0 mmHg LVOT Vmax:         118.00 cm/s LVOT Vmean:        73.100 cm/s LVOT VTI:          0.237 m LVOT/AV VTI ratio: 0.91  AORTA Ao Root diam: 3.10 cm MITRAL VALVE MV Area (PHT): 5.84 cm    SHUNTS MV Area VTI:   4.79 cm    Systemic VTI:  0.24 m MV Peak grad:  3.6 mmHg    Systemic Diam: 2.20 cm MV Mean grad:  2.0 mmHg    Pulmonic VTI:  0.194 m MV Vmax:       0.95 m/s MV Vmean:      74.9 cm/s MV Decel Time: 130 msec MV E velocity: 68.60 cm/s MV A velocity: 89.50 cm/s MV E/A ratio:  0.77 Harrell Gave End MD Electronically signed by Nelva Bush MD Signature Date/Time: 03/27/2021/12:32:00 PM    Final         Scheduled Meds:  amLODipine  10 mg Oral Daily   atorvastatin  80 mg Oral Daily   Chlorhexidine Gluconate Cloth  6 each Topical  Daily   insulin aspart  0-9 Units Subcutaneous Q4H   insulin aspart  8 Units Subcutaneous TID WC   insulin detemir  15 Units Subcutaneous BID   labetalol  150 mg Oral TID   lisinopril  40 mg Oral Daily   Continuous Infusions:  lactated ringers with kcl 75 mL/hr at 03/28/21 0749   levETIRAcetam 500 mg (03/28/21 0943)   magnesium sulfate bolus IVPB     potassium chloride       LOS: 4 days   The patient is critically ill with multiple organ systems failure and requires high complexity decision making for assessment and support, frequent evaluation and titration of  therapies, application of advanced monitoring technologies and extensive interpretation of multiple databases. Critical Care Time devoted to patient care services described in this note  Time spent: 40 minutes     Ivee Poellnitz, Geraldo Docker, MD [facility]   If 7PM-7AM, please contact night-coverage 03/28/2021, 11:43 AM

## 2021-03-28 NOTE — Plan of Care (Signed)

## 2021-03-28 NOTE — Progress Notes (Addendum)
PHARMACY CONSULT NOTE - FOLLOW UP  Pharmacy Consult for Electrolyte Monitoring and Replacement   Recent Labs: Potassium (mmol/L)  Date Value  03/28/2021 3.3 (L)   Magnesium (mg/dL)  Date Value  03/28/2021 1.2 (L)   Calcium (mg/dL)  Date Value  03/28/2021 6.6 (L)   Albumin (g/dL)  Date Value  03/28/2021 2.4 (L)   Phosphorus (mg/dL)  Date Value  03/28/2021 4.0   Sodium (mmol/L)  Date Value  03/28/2021 141   Corrected Ca: 8.32  Assessment: Pharmacy has been consulted to monitor electrolytes in 59yo patient with epidural and subdural hematoma following a witnessed fall possibly d/t seizure activity.   IVF: LR w/ 40 mEq KCL@75ml /hr  Goal of Therapy: Electrolytes WNL   Plan:  K 3.1>3.3, after KCL IV q1hr x4 runs and 66meq PO yesterday Received 66meq PO x1; then MD ordered 20mEq PO BID x2doses Continues  LR with 36meq KCL @ 20ml/hr Mg 1.3>1.2 MD would like to replete with 4g MgSO4 (recevied 2g in AM, so repeating 2g) Ca (corrected 7.9): MD would like to replete with 3g CaGluc x1. Will follow up AM labs and continue replacement as needed.  Lorna Dibble, PharmD,BCCP Clinical Pharmacist 03/28/2021 11:30 AM

## 2021-03-29 DIAGNOSIS — E1100 Type 2 diabetes mellitus with hyperosmolarity without nonketotic hyperglycemic-hyperosmolar coma (NKHHC): Secondary | ICD-10-CM

## 2021-03-29 LAB — GLUCOSE, CAPILLARY
Glucose-Capillary: 96 mg/dL (ref 70–99)
Glucose-Capillary: 107 mg/dL — ABNORMAL HIGH (ref 70–99)
Glucose-Capillary: 208 mg/dL — ABNORMAL HIGH (ref 70–99)
Glucose-Capillary: 181 mg/dL — ABNORMAL HIGH (ref 70–99)
Glucose-Capillary: 188 mg/dL — ABNORMAL HIGH (ref 70–99)

## 2021-03-29 LAB — CBC WITH DIFFERENTIAL/PLATELET
Abs Immature Granulocytes: 0.03 10*3/uL (ref 0.00–0.07)
Basophils Absolute: 0 10*3/uL (ref 0.0–0.1)
Basophils Relative: 0 %
Eosinophils Absolute: 0.1 10*3/uL (ref 0.0–0.5)
Eosinophils Relative: 2 %
HCT: 37 % — ABNORMAL LOW (ref 39.0–52.0)
Hemoglobin: 12.4 g/dL — ABNORMAL LOW (ref 13.0–17.0)
Immature Granulocytes: 1 %
Lymphocytes Relative: 26 %
Lymphs Abs: 1.6 10*3/uL (ref 0.7–4.0)
MCH: 28 pg (ref 26.0–34.0)
MCHC: 33.5 g/dL (ref 30.0–36.0)
MCV: 83.5 fL (ref 80.0–100.0)
Monocytes Absolute: 0.6 10*3/uL (ref 0.1–1.0)
Monocytes Relative: 10 %
Neutro Abs: 3.8 10*3/uL (ref 1.7–7.7)
Neutrophils Relative %: 61 %
Platelets: 213 10*3/uL (ref 150–400)
RBC: 4.43 MIL/uL (ref 4.22–5.81)
RDW: 14 % (ref 11.5–15.5)
WBC: 6.2 10*3/uL (ref 4.0–10.5)
nRBC: 0 % (ref 0.0–0.2)

## 2021-03-29 LAB — COMPREHENSIVE METABOLIC PANEL
ALT: 11 U/L (ref 0–44)
AST: 29 U/L (ref 15–41)
Albumin: 2.5 g/dL — ABNORMAL LOW (ref 3.5–5.0)
Alkaline Phosphatase: 51 U/L (ref 38–126)
Anion gap: 7 (ref 5–15)
BUN: 13 mg/dL (ref 6–20)
CO2: 26 mmol/L (ref 22–32)
Calcium: 7.2 mg/dL — ABNORMAL LOW (ref 8.9–10.3)
Chloride: 107 mmol/L (ref 98–111)
Creatinine, Ser: 1.97 mg/dL — ABNORMAL HIGH (ref 0.61–1.24)
GFR, Estimated: 39 mL/min — ABNORMAL LOW (ref >60)
Glucose, Bld: 109 mg/dL — ABNORMAL HIGH (ref 70–99)
Potassium: 4.1 mmol/L (ref 3.5–5.1)
Sodium: 140 mmol/L (ref 135–145)
Total Bilirubin: 0.6 mg/dL (ref 0.3–1.2)
Total Protein: 5.9 g/dL — ABNORMAL LOW (ref 6.5–8.1)

## 2021-03-29 LAB — MAGNESIUM: Magnesium: 1.8 mg/dL (ref 1.7–2.4)

## 2021-03-29 LAB — PHOSPHORUS: Phosphorus: 4.5 mg/dL (ref 2.5–4.6)

## 2021-03-29 MED ORDER — INSULIN ASPART 100 UNIT/ML IJ SOLN
0.0000 [IU] | Freq: Three times a day (TID) | INTRAMUSCULAR | Status: DC
  Administered 2021-03-30: 1 [IU] via SUBCUTANEOUS
  Filled 2021-03-29: qty 1

## 2021-03-29 MED ORDER — LEVETIRACETAM 500 MG PO TABS: 500.0000 mg | ORAL_TABLET | Freq: Two times a day (BID) | ORAL | Status: DC

## 2021-03-29 MED ORDER — INSULIN DETEMIR 100 UNIT/ML ~~LOC~~ SOLN
20.0000 [IU] | Freq: Two times a day (BID) | SUBCUTANEOUS | Status: DC
  Administered 2021-03-29 – 2021-03-30 (×2): 20 [IU] via SUBCUTANEOUS
  Filled 2021-03-29 (×3): qty 0.2

## 2021-03-29 MED ORDER — LEVETIRACETAM 500 MG PO TABS
500.0000 mg | ORAL_TABLET | Freq: Two times a day (BID) | ORAL | Status: DC
  Administered 2021-03-29 – 2021-03-30 (×2): 500 mg via ORAL
  Filled 2021-03-29 (×2): qty 1

## 2021-03-29 MED ORDER — INSULIN ASPART 100 UNIT/ML IJ SOLN
5.0000 [IU] | Freq: Three times a day (TID) | INTRAMUSCULAR | Status: DC
  Administered 2021-03-29: 5 [IU] via SUBCUTANEOUS
  Filled 2021-03-29 (×2): qty 1

## 2021-03-29 MED ORDER — MAGNESIUM SULFATE 2 GM/50ML IV SOLN
2.0000 g | Freq: Once | INTRAVENOUS | Status: AC
  Administered 2021-03-29: 2 g via INTRAVENOUS
  Filled 2021-03-29: qty 50

## 2021-03-29 NOTE — Progress Notes (Signed)
Mobility Specialist - Progress Note   03/29/21 1527  Mobility  Activity Refused mobility  Mobility performed by Mobility specialist    Pt initially agreeable to mobility, but then politely declined---no reason specified. Pt left in bed, family at bedside.    Kathee Delton Mobility Specialist 03/29/21, 3:28 PM

## 2021-03-29 NOTE — Progress Notes (Signed)
Called report to [place], RN for patient to go to room 212.  Girlfriend at bedside.

## 2021-03-29 NOTE — TOC Progression Note (Signed)
Transition of Care [facility]) - Progression Note    Patient Details  Name: Chase Rodriguez MRN: [id number] Date of Birth: 03/08/1961  [facility] CM/SW Contact  Kerin Salen, RN Phone Number: 03/29/2021, 3:57 PM  Clinical Narrative:  Patient given Glucometer, information for community resources and he mentioned that he gets care at the [facility]. [facility] to confirm, so please send Rx's electronically to [facility] [phone]. Information per Pharmacist. Attending given information. Patient says that his brother will pick him up when ready for discharge.       Expected Discharge Plan: [place] Barriers to Discharge: Inadequate or no insurance, Continued Medical Work up  Expected Discharge Plan and Services Expected Discharge Plan: [place] In-house Referral: Clinical Social Work     Living arrangements for the past 2 months: Mobile Home                                       Social Determinants of Health (SDOH) Interventions    Readmission Risk Interventions No flowsheet data found.

## 2021-03-29 NOTE — Progress Notes (Signed)
[place] [facility] [place] NAME: Chase Rodriguez    MR#:  [id number]  DATE OF BIRTH:  June 28, 1960  SUBJECTIVE:  girlfriend at bedside. Patient denies any seizures. He has been not able to take his medications appropriate due to losing job in September. No seizures reported by RN. Tolerating PO diet  REVIEW OF SYSTEMS:   Review of Systems  Constitutional:  Negative for chills, fever and weight loss.  HENT:  Negative for ear discharge, ear pain and nosebleeds.   Eyes:  Negative for blurred vision, pain and discharge.  Respiratory:  Negative for sputum production, shortness of breath, wheezing and stridor.   Cardiovascular:  Negative for chest pain, palpitations, orthopnea and PND.  Gastrointestinal:  Negative for abdominal pain, diarrhea, nausea and vomiting.  Genitourinary:  Negative for frequency and urgency.  Musculoskeletal:  Negative for back pain and joint pain.  Neurological:  Negative for sensory change, speech change, focal weakness and weakness.  Psychiatric/Behavioral:  Negative for depression and hallucinations. The patient is not nervous/anxious.   Tolerating Diet:yes Tolerating PT: not needed  DRUG ALLERGIES:  No Known Allergies  VITALS:  Blood pressure (!) 163/92, pulse 88, temperature 97.9 F (36.6 C), temperature source Oral, resp. rate 18, height 5\' 11"  (1.803 m), weight 105 kg, SpO2 99 %.  PHYSICAL EXAMINATION:   Physical Exam  GENERAL:  60 y.o.-year-old patient lying in the bed with no acute distress. obese HEENT: Head atraumatic, normocephalic. Oropharynx and nasopharynx clear.  NECK:  Supple, no jugular venous distention. No thyroid enlargement, no tenderness.  LUNGS: Normal breath sounds bilaterally, no wheezing, rales, rhonchi. No use of accessory muscles of respiration.  CARDIOVASCULAR: S1, S2 normal. No murmurs, rubs, or gallops.  ABDOMEN: Soft, nontender, nondistended. Bowel sounds present. No organomegaly or mass.   EXTREMITIES: No cyanosis, clubbing or edema b/l.    NEUROLOGIC: Cranial nerves II through XII are intact. No focal Motor or sensory deficits b/l.   PSYCHIATRIC:  patient is alert and oriented x 3.  SKIN: No obvious rash, lesion, or ulcer.   LABORATORY PANEL:  CBC Recent Labs  Lab 03/29/21 0435  WBC 6.2  HGB 12.4*  HCT 37.0*  PLT 213    Chemistries  Recent Labs  Lab 03/29/21 0435  NA 140  K 4.1  CL 107  CO2 26  GLUCOSE 109*  BUN 13  CREATININE 1.97*  CALCIUM 7.2*  MG 1.8  AST 29  ALT 11  ALKPHOS 51  BILITOT 0.6   Cardiac Enzymes No results for input(s): TROPONINI in the last 168 hours. RADIOLOGY:  No results found. ASSESSMENT AND PLAN:  60 y.o. BM PMHx DM type II uncontrolled with complication, DM nephropathy, CKD stage IIIb, essential HTN, HLD, Hepatitis  Presents with the chief complaint of yncopal episode/seizure.    According to girlfriend, patent sitting on couch and eating food and then slumped over and had jerking of his right arm and incontinence.  He fell to the ground striking the right side of his head on the carpeted floor.   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 -Treatment per neurology   LEFT subdural hematoma -Neurochecks q 4 hours - MRI/MRA brain 3 mm acute ischemic nonhemorrhagic infarct involving the right basal ganglia. 2. Stable bilateral extra-axial hemorrhages, measuring up to 7-8 mm on the right and 2-3 mm in thickness on the left. No significant midline shift. 3. Underlying mild chronic microvascular ischemic disease with small remote  lacunar infarcts involving the left frontal corona radiata and right pons. -SBP goal<150 -- seen by neurosurgery no further imaging needed. Follow-up in 2 to 3 be  Right epidural hematoma as above  Witnessed generalized seizure -EEG This study is within normal limits. No seizures or epileptiform discharges were seen throughout the recording. -  Per Neurology For new onset seizure, continue Keppra at 500 mg BID. Will also need to continue at discharge.  -- Ambulatory referral to outpatient Neurology upon hospital discharge - Hold antiplatelet agent for now in the setting of subacute epidural and subdural hematoma. - Re-image with head CT in 2 weeks. If hemorrhages are resolving without evidence for rebleeding, most likely will be safe to restart antiplatelet medication for secondary stroke prevention, given that the hemorrhages were traumatic.  - Ambulatory referral to neurology within 2 weeks of hospital discharge - Neurohospitalist service will signed off.  DM type II uncontrolled with Hyperglycemia - 10/29 Hemoglobin A1c= 12.5 -Levemir 25 units BID -NovoLog 5 units qac -10/31 decrease sensitive SSI  -Seen by diabetic coordinator on 10/30 - Patient requires glucometer.  Have requested one thru TOC  DM nephropathy/CKD stage IIIb Recent Labs       Lab Results  Component Value Date    CREATININE 1.97 (H) 03/28/2021    CREATININE 1.97 (H) 03/27/2021    CREATININE 2.08 (H) 03/26/2021    CREATININE 2.19 (H) 03/26/2021    CREATININE 2.19 (H) 03/26/2021        Essential HTN -Goal SBP 130-150, no permissive HTN in the setting of subacute epidural and subdural hematoma -Amlodipine, hydralazine, labetalol, lisinopril   -HLD -10/29 LDL= 170: Goal<70 mg/deciliter -Lipitor 80 mg daily  Chronic Hepatitis C without Hepatic Coma. -Unsure if patient received treatment will address in a.m. obviously would not acutely receive treatment now.   Obese (BMI 31. 76 kg/m)   TOC for medication help  DVT prophylaxis: SCD Code Status: Full Family Communication: 11/2--girlfriend at bedside for discussion of plan of care all questions answered Status is: Inpatient       Dispo: The patient is from: Home              Anticipated d/c is to: Home              Anticipated d/c date is: likely tomorrow              Patient currently is   medically stable to d/c--will monitor sugars one more day                     TOTAL TIME TAKING CARE OF THIS PATIENT: 30 minutes.  >50% time spent on counselling and coordination of care  Note: This dictation was prepared with Dragon dictation along with smaller phrase technology. Any transcriptional errors that result from this process are unintentional.  Fritzi Mandes M.D    [facility]   CC: Primary care physician; [facility] [place] Patient ID: Chase Rodriguez, male   DOB: Dec 03, 1960, 60 y.o.   MRN: [id number]

## 2021-03-29 NOTE — Progress Notes (Signed)
[facility] for Electrolyte Monitoring and Replacement   Recent Labs: Potassium (mmol/L)  Date Value  03/29/2021 4.1   Magnesium (mg/dL)  Date Value  03/29/2021 1.8   Calcium (mg/dL)  Date Value  03/29/2021 7.2 (L)   Albumin (g/dL)  Date Value  03/29/2021 2.5 (L)   Phosphorus (mg/dL)  Date Value  03/29/2021 4.5   Sodium (mmol/L)  Date Value  03/29/2021 140   Corrected Ca: 8.4 mg/dL  Assessment: Pharmacy has been consulted to monitor electrolytes in 59yo patient with epidural and subdural hematoma following a witnessed fall possibly d/t seizure activity.   IVF: LR w/ 40 mEq KCL@75ml /hr  Goal of Therapy: Potassium 4.0 - 5.1 mmol/L Magnesium 2.0 - 2.4 mg/dL All Other Electrolytes WNL   Plan:  Continue MIVF with potassium as above Magnesium sulfate 2 grams IV x 1 Will follow up AM labs and continue replacement as needed.  Dallie Piles, PharmD,BCPS Clinical Pharmacist 03/29/2021 11:16 AM

## 2021-03-30 ENCOUNTER — Other Ambulatory Visit: Payer: Self-pay

## 2021-03-30 LAB — COMPREHENSIVE METABOLIC PANEL
ALT: 12 U/L (ref 0–44)
AST: 25 U/L (ref 15–41)
Albumin: 2.6 g/dL — ABNORMAL LOW (ref 3.5–5.0)
Alkaline Phosphatase: 51 U/L (ref 38–126)
Anion gap: 8 (ref 5–15)
BUN: 15 mg/dL (ref 6–20)
CO2: 27 mmol/L (ref 22–32)
Calcium: 7.5 mg/dL — ABNORMAL LOW (ref 8.9–10.3)
Chloride: 104 mmol/L (ref 98–111)
Creatinine, Ser: 2.04 mg/dL — ABNORMAL HIGH (ref 0.61–1.24)
GFR, Estimated: 37 mL/min — ABNORMAL LOW (ref >60)
Glucose, Bld: 105 mg/dL — ABNORMAL HIGH (ref 70–99)
Potassium: 3.9 mmol/L (ref 3.5–5.1)
Sodium: 139 mmol/L (ref 135–145)
Total Bilirubin: 0.4 mg/dL (ref 0.3–1.2)
Total Protein: 6.1 g/dL — ABNORMAL LOW (ref 6.5–8.1)

## 2021-03-30 LAB — CBC WITH DIFFERENTIAL/PLATELET
Abs Immature Granulocytes: 0.02 10*3/uL (ref 0.00–0.07)
Basophils Absolute: 0 10*3/uL (ref 0.0–0.1)
Basophils Relative: 0 %
Eosinophils Absolute: 0.1 10*3/uL (ref 0.0–0.5)
Eosinophils Relative: 2 %
HCT: 37.7 % — ABNORMAL LOW (ref 39.0–52.0)
Hemoglobin: 12.4 g/dL — ABNORMAL LOW (ref 13.0–17.0)
Immature Granulocytes: 0 %
Lymphocytes Relative: 35 %
Lymphs Abs: 1.8 10*3/uL (ref 0.7–4.0)
MCH: 27.7 pg (ref 26.0–34.0)
MCHC: 32.9 g/dL (ref 30.0–36.0)
MCV: 84.3 fL (ref 80.0–100.0)
Monocytes Absolute: 0.5 10*3/uL (ref 0.1–1.0)
Monocytes Relative: 10 %
Neutro Abs: 2.9 10*3/uL (ref 1.7–7.7)
Neutrophils Relative %: 53 %
Platelets: 244 10*3/uL (ref 150–400)
RBC: 4.47 MIL/uL (ref 4.22–5.81)
RDW: 14 % (ref 11.5–15.5)
WBC: 5.3 10*3/uL (ref 4.0–10.5)
nRBC: 0 % (ref 0.0–0.2)

## 2021-03-30 LAB — PHOSPHORUS: Phosphorus: 4.9 mg/dL — ABNORMAL HIGH (ref 2.5–4.6)

## 2021-03-30 LAB — GLUCOSE, CAPILLARY
Glucose-Capillary: 127 mg/dL — ABNORMAL HIGH (ref 70–99)
Glucose-Capillary: 186 mg/dL — ABNORMAL HIGH (ref 70–99)

## 2021-03-30 LAB — MAGNESIUM: Magnesium: 1.6 mg/dL — ABNORMAL LOW (ref 1.7–2.4)

## 2021-03-30 MED ORDER — LEVETIRACETAM 500 MG PO TABS: 500.0000 mg | ORAL_TABLET | Freq: Two times a day (BID) | ORAL | 5 refills | Status: DC

## 2021-03-30 MED ORDER — LABETALOL HCL 300 MG PO TABS: 150.0000 mg | ORAL_TABLET | Freq: Two times a day (BID) | ORAL | 3 refills | Status: DC

## 2021-03-30 MED ORDER — INSULIN DETEMIR 100 UNIT/ML ~~LOC~~ SOLN
20.0000 [IU] | Freq: Two times a day (BID) | SUBCUTANEOUS | 11 refills | Status: AC
  Filled 2021-03-30: qty 10, 25d supply, fill #0
  Filled 2021-06-26: qty 10, 25d supply, fill #1
  Filled 2021-07-31: qty 10, 25d supply, fill #2
  Filled 2021-12-12: qty 10, 25d supply, fill #0

## 2021-03-30 MED ORDER — MAGNESIUM SULFATE 4 GM/100ML IV SOLN
4.0000 g | Freq: Once | INTRAVENOUS | Status: AC
  Administered 2021-03-30: 4 g via INTRAVENOUS
  Filled 2021-03-30: qty 100

## 2021-03-30 MED ORDER — AMLODIPINE BESYLATE 10 MG PO TABS: 10.0000 mg | ORAL_TABLET | Freq: Every day | ORAL | 3 refills | Status: DC

## 2021-03-30 MED ORDER — LISINOPRIL 40 MG PO TABS
40.0000 mg | ORAL_TABLET | Freq: Every day | ORAL | 2 refills | Status: DC
Start: 2021-03-30 — End: 2021-10-11
  Filled 2021-03-30: qty 30, 30d supply, fill #0
  Filled 2021-06-26: qty 30, 30d supply, fill #1
  Filled 2021-07-31: qty 30, 30d supply, fill #2

## 2021-03-30 MED ORDER — LISINOPRIL 40 MG PO TABS: 40.0000 mg | ORAL_TABLET | Freq: Every day | ORAL | 2 refills | Status: DC

## 2021-03-30 MED ORDER — POLYETHYLENE GLYCOL 3350 17 G PO PACK: 17.0000 g | PACK | Freq: Every day | ORAL | 0 refills | Status: DC | PRN

## 2021-03-30 MED ORDER — LABETALOL HCL 300 MG PO TABS
150.0000 mg | ORAL_TABLET | Freq: Two times a day (BID) | ORAL | 3 refills | Status: DC
  Filled 2021-03-30: qty 30, 30d supply, fill #0
  Filled 2021-05-22 – 2021-06-02 (×2): qty 30, 30d supply, fill #1
  Filled 2021-06-26: qty 23, 23d supply, fill #2
  Filled 2021-07-31: qty 30, 30d supply, fill #3
  Filled 2021-09-05: qty 30, 30d supply, fill #4
  Filled 2021-10-02: qty 30, 30d supply, fill #5
  Filled 2021-11-09: qty 30, 30d supply, fill #0
  Filled 2021-12-12: qty 30, 30d supply, fill #1

## 2021-03-30 MED ORDER — AMLODIPINE BESYLATE 10 MG PO TABS
10.0000 mg | ORAL_TABLET | Freq: Every day | ORAL | 3 refills | Status: DC
Start: 2021-03-31 — End: 2021-10-11
  Filled 2021-03-30: qty 30, 30d supply, fill #0
  Filled 2021-05-26 – 2021-06-02 (×2): qty 30, 30d supply, fill #1
  Filled 2021-06-26: qty 30, 30d supply, fill #2
  Filled 2021-07-31: qty 30, 30d supply, fill #3

## 2021-03-30 MED ORDER — "INSULIN SYRINGE-NEEDLE U-100 31G X 5/16"" 0.3 ML MISC"
99 refills | Status: DC
  Filled 2021-03-30: qty 60, 30d supply, fill #0

## 2021-03-30 MED ORDER — INSULIN DETEMIR 100 UNIT/ML ~~LOC~~ SOLN: 20.0000 [IU] | Freq: Two times a day (BID) | SUBCUTANEOUS | 11 refills | Status: DC

## 2021-03-30 MED ORDER — LEVETIRACETAM 500 MG PO TABS
500.0000 mg | ORAL_TABLET | Freq: Two times a day (BID) | ORAL | 5 refills | Status: DC
  Filled 2021-03-30: qty 60, 30d supply, fill #0
  Filled 2021-06-26: qty 60, 30d supply, fill #1
  Filled 2021-07-31: qty 60, 30d supply, fill #2
  Filled 2021-09-05: qty 60, 30d supply, fill #3
  Filled 2021-10-02: qty 60, 30d supply, fill #4
  Filled 2021-11-09: qty 60, 30d supply, fill #0

## 2021-03-30 MED ORDER — ATORVASTATIN CALCIUM 20 MG PO TABS
20.0000 mg | ORAL_TABLET | Freq: Every day | ORAL | 10 refills | Status: AC
  Filled 2021-03-30: qty 30, 30d supply, fill #0
  Filled 2021-06-21: qty 30, 30d supply, fill #1
  Filled 2021-07-31: qty 30, 30d supply, fill #2
  Filled 2021-09-05: qty 30, 30d supply, fill #3
  Filled 2021-10-02: qty 30, 30d supply, fill #4
  Filled 2021-11-09 (×2): qty 30, 30d supply, fill #0
  Filled 2021-12-12: qty 30, 30d supply, fill #1
  Filled 2022-01-09: qty 30, 30d supply, fill #2

## 2021-03-30 NOTE — Discharge Summary (Addendum)
[facility] [place] NAME: Chase Rodriguez    MR#:  [id number]  DATE OF BIRTH:  [birth year]  DATE OF ADMISSION:  03/24/2021 ADMITTING PHYSICIAN: Bennie Pierini, MD  DATE OF DISCHARGE: 03/30/2021  PRIMARY CARE PHYSICIAN: [facility], [facility]    ADMISSION DIAGNOSIS:  Hypokalemia [E87.6] Hypomagnesemia [E83.42] Seizure ([place]) [R56.9] Seizures ([place]) [R56.9] Epidural hematoma [S06.4XAA] Hyperosmolar hyperglycemic state (HHS) ([place]) [E11.00]  DISCHARGE DIAGNOSIS:  Acute Basal ganglia infarct--ischemic Left subdural hematoma, Right epidural hematoma post seizure Generalized seizures DM-II with CKD-IIIB SECONDARY DIAGNOSIS:   Past Medical History:  Diagnosis Date  . Diabetes mellitus without complication ([place])   . Hepatitis   . High cholesterol   . Hypertension   . Stage 3b chronic kidney disease (CKD) ([place])     HOSPITAL COURSE:  60 y.o. BM PMHx DM type II uncontrolled with complication, DM nephropathy, CKD stage IIIb, essential HTN, HLD, Hepatitis  Presents with the chief complaint of yncopal episode/seizure.    According to girlfriend, patent sitting on couch and eating food and then slumped over and had jerking of his right arm and incontinence.  He fell to the ground striking the right side of his head on the carpeted floor.    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 -Treatment per neurology   LEFT subdural hematoma -Neurochecks q 4 hours - MRI/MRA brain 3 mm acute ischemic nonhemorrhagic infarct involving the right basal ganglia. 2. Stable bilateral extra-axial hemorrhages, measuring up to 7-8 mm on the right and 2-3 mm in thickness on the left. No significant midline shift. 3. Underlying mild chronic microvascular ischemic disease with small remote lacunar infarcts involving the left frontal corona radiata and right pons. -SBP goal<150 -- seen by neurosurgery no  further imaging needed. Follow-up in 2 to 3 be  Right epidural hematoma as above  Witnessed generalized seizure -EEG This study is within normal limits. No seizures or epileptiform discharges were seen throughout the recording. - Per Neurology For new onset seizure, continue Keppra at 500 mg BID.  - Hold antiplatelet agent for now in the setting of subacute epidural and subdural hematoma. - Re-image with head CT in 2 weeks. If hemorrhages are resolving without evidence for rebleeding, most likely will be safe to restart antiplatelet medication for secondary stroke prevention, given that the hemorrhages were traumatic.  - Ambulatory referral to neurology within 2 weeks of hospital discharge via [facility] - pt advised NOT to drive till cleared by PCP or Neurologist  DM type II uncontrolled with Hyperglycemia - 10/29 Hemoglobin A1c= 12.5 -Levemir 20 units BID -Seen by diabetic coordinator on 10/30 - Patient requires glucometer.  Have requested one thru TOC. Dietitian to see today  DM nephropathy/CKD stage IIIb Recent Labs           Lab Results  Component Value Date    CREATININE 1.97 (H) 03/28/2021    CREATININE 1.97 (H) 03/27/2021    CREATININE 2.08 (H) 03/26/2021    CREATININE 2.19 (H) 03/26/2021    CREATININE 2.19 (H) 03/26/2021        Essential HTN -Goal SBP 130-150, no permissive HTN in the setting of subacute epidural and subdural hematoma -Amlodipine, hydralazine, labetalol, lisinopril    -HLD - LDL= 170: Goal<70 mg/deciliter -Lipitor   Chronic Hepatitis C without Hepatic Coma. -f/u PCP   Obese (BMI 31. 76 kg/m)   TOC for medication help   DVT prophylaxis: SCD Code Status:  Full Family Communication: 11/3--girlfriend at bedside for discussion of plan of care all questions answered Status is: Inpatient       Dispo: The patient is from: Home              Anticipated d/c is to: Home              Anticipated d/c date is: today              Patient currently  is  medically stable to d/c    CONSULTS OBTAINED:  Treatment Team:  Derek Jack, MD Abd-El-Barr, Rogue Jury, MD  DRUG ALLERGIES:  No Known Allergies  DISCHARGE MEDICATIONS:   Allergies as of 03/30/2021   No Known Allergies      Medication List     STOP taking these medications    carvedilol 6.25 MG tablet Commonly known as: COREG   cetirizine 10 MG tablet Commonly known as: ZYRTEC   Potassium Chloride ER 20 MEQ Tbcr       TAKE these medications    amLODipine 10 MG tablet Commonly known as: NORVASC Take 1 tablet (10 mg total) by mouth daily. Start taking on: March 31, 2021   atorvastatin 20 MG tablet Commonly known as: LIPITOR Take 1 tablet (20 mg total) by mouth daily.   insulin detemir 100 UNIT/ML injection Commonly known as: LEVEMIR Inject 0.2 mLs (20 Units total) into the skin 2 (two) times daily.   labetalol 300 MG tablet Commonly known as: NORMODYNE Take 0.5 tablets (150 mg total) by mouth 2 (two) times daily.   levETIRAcetam 500 MG tablet Commonly known as: KEPPRA Take 1 tablet (500 mg total) by mouth 2 (two) times daily.   lisinopril 40 MG tablet Commonly known as: ZESTRIL Take 1 tablet (40 mg total) by mouth daily.               Durable Medical Equipment  (From admission, onward)           Start     Ordered   03/28/21 2008  DME Glucometer  Once        03/28/21 2008            If you experience worsening of your admission symptoms, develop shortness of breath, life threatening emergency, suicidal or homicidal thoughts you must seek medical attention immediately by calling 911 or calling your MD immediately  if symptoms less severe.  You Must read complete instructions/literature along with all the possible adverse reactions/side effects for all the Medicines you take and that have been prescribed to you. Take any new Medicines after you have completely understood and accept all the possible adverse reactions/side  effects.   Please note  You were cared for by a hospitalist during your hospital stay. If you have any questions about your discharge medications or the care you received while you were in the hospital after you are discharged, you can call the unit and asked to speak with the hospitalist on call if the hospitalist that took care of you is not available. Once you are discharged, your primary care physician will handle any further medical issues. Please note that NO REFILLS for any discharge medications will be authorized once you are discharged, as it is imperative that you return to your primary care physician (or establish a relationship with a primary care physician if you do not have one) for your aftercare needs so that they can reassess your need for medications and monitor your lab values. Today  SUBJECTIVE     VITAL SIGNS:  Blood pressure (!) 155/99, pulse 94, temperature 97.6 F (36.4 C), temperature source Oral, resp. rate 19, height 5\' 11"  (1.803 m), weight 105 kg, SpO2 100 %.  I/O:   Intake/Output Summary (Last 24 hours) at 03/30/2021 1039 Last data filed at 03/30/2021 1032 Gross per 24 hour  Intake 960 ml  Output 600 ml  Net 360 ml    PHYSICAL EXAMINATION:  GENERAL:  60 y.o.-year-old patient lying in the bed with no acute distress.  EYES: Pupils equal, round, reactive to light and accommodation. No scleral icterus.  HEENT: Head atraumatic, normocephalic. Oropharynx and nasopharynx clear.  NECK:  Supple, no jugular venous distention. No thyroid enlargement, no tenderness.  LUNGS: Normal breath sounds bilaterally, no wheezing, rales,rhonchi or crepitation. No use of accessory muscles of respiration.  CARDIOVASCULAR: S1, S2 normal. No murmurs, rubs, or gallops.  ABDOMEN: Soft, non-tender, non-distended. Bowel sounds present. No organomegaly or mass.  EXTREMITIES: No pedal edema, cyanosis, or clubbing.  NEUROLOGIC: Cranial nerves II through XII are intact. Muscle strength  5/5 in all extremities. Sensation intact. Gait not checked.  PSYCHIATRIC: The patient is alert and oriented x 3.  SKIN: No obvious rash, lesion, or ulcer.   DATA REVIEW:   CBC  Recent Labs  Lab 03/30/21 0645  WBC 5.3  HGB 12.4*  HCT 37.7*  PLT 244    Chemistries  Recent Labs  Lab 03/30/21 0645  NA 139  K 3.9  CL 104  CO2 27  GLUCOSE 105*  BUN 15  CREATININE 2.04*  CALCIUM 7.5*  MG 1.6*  AST 25  ALT 12  ALKPHOS 51  BILITOT 0.4    Microbiology Results   Recent Results (from the past 240 hour(s))  Resp Panel by RT-PCR (Flu A&B, Covid) Nasopharyngeal Swab     Status: None   Collection Time: 03/24/21  9:37 PM   Specimen: Nasopharyngeal Swab; Nasopharyngeal(NP) swabs in vial transport medium  Result Value Ref Range Status   SARS Coronavirus 2 by RT PCR NEGATIVE NEGATIVE Final    Comment: (NOTE) SARS-CoV-2 target nucleic acids are NOT DETECTED.  The SARS-CoV-2 RNA is generally detectable in upper respiratory specimens during the acute phase of infection. The lowest concentration of SARS-CoV-2 viral copies this assay can detect is 138 copies/mL. A negative result does not preclude SARS-Cov-2 infection and should not be used as the sole basis for treatment or other patient management decisions. A negative result may occur with  improper specimen collection/handling, submission of specimen other than nasopharyngeal swab, presence of viral mutation(s) within the areas targeted by this assay, and inadequate number of viral copies(<138 copies/mL). A negative result must be combined with clinical observations, patient history, and epidemiological information. The expected result is Negative.  Fact Sheet for Patients:  EntrepreneurPulse.com.au  Fact Sheet for Healthcare Providers:  IncredibleEmployment.be  This test is no t yet approved or cleared by the [place] FDA and  has been authorized for detection and/or diagnosis of  SARS-CoV-2 by FDA under an Emergency Use Authorization (EUA). This EUA will remain  in effect (meaning this test can be used) for the duration of the COVID-19 declaration under Section 564(b)(1) of the Act, 21 U.S.C.section 360bbb-3(b)(1), unless the authorization is terminated  or revoked sooner.       Influenza A by PCR NEGATIVE NEGATIVE Final   Influenza B by PCR NEGATIVE NEGATIVE Final    Comment: (NOTE) The Xpert Xpress SARS-CoV-2/FLU/RSV plus assay is intended as an aid  in the diagnosis of influenza from Nasopharyngeal swab specimens and should not be used as a sole basis for treatment. Nasal washings and aspirates are unacceptable for Xpert Xpress SARS-CoV-2/FLU/RSV testing.  Fact Sheet for Patients: EntrepreneurPulse.com.au  Fact Sheet for Healthcare Providers: IncredibleEmployment.be  This test is not yet approved or cleared by the [place] FDA and has been authorized for detection and/or diagnosis of SARS-CoV-2 by FDA under an Emergency Use Authorization (EUA). This EUA will remain in effect (meaning this test can be used) for the duration of the COVID-19 declaration under Section 564(b)(1) of the Act, 21 U.S.C. section 360bbb-3(b)(1), unless the authorization is terminated or revoked.  Performed at [facility], [address] [place], [place] [address]   MRSA Next Gen by PCR, Nasal     Status: None   Collection Time: 03/25/21 12:30 AM   Specimen: Nasal Mucosa; Nasal Swab  Result Value Ref Range Status   MRSA by PCR Next Gen NOT DETECTED NOT DETECTED Final    Comment: (NOTE) The GeneXpert MRSA Assay (FDA approved for NASAL specimens only), is one component of a comprehensive MRSA colonization surveillance program. It is not intended to diagnose MRSA infection nor to guide or monitor treatment for MRSA infections. Test performance is not FDA approved in patients less than 66 years old. Performed at [facility], [address] [place], [place] [address]     RADIOLOGY:  No results found.   CODE STATUS:     Code Status Orders  (From admission, onward)           Start     Ordered   03/24/21 2214  Full code  Continuous        03/24/21 2214           Code Status History     This patient has a current code status but no historical code status.        TOTAL TIME TAKING CARE OF THIS PATIENT: 40 minutes.    Fritzi Mandes M.D  [facility]    CC: Primary care physician; [facility], [facility]

## 2021-03-30 NOTE — TOC Transition Note (Signed)
Transition of Care [facility]) - CM/SW Discharge Note   Patient Details  Name: Chase Rodriguez MRN: [id number] Date of Birth: [birth year]  [facility] of [facility]) CM/SW Contact:  Candie Chroman, LCSW Phone Number: 03/30/2021, 2:00 PM   Clinical Narrative:   Patient has orders to discharge home today. Per nurse secretary, medications have been delivered. No further concerns. CSW signing off.  Final next level of care: Home/Self Care Barriers to Discharge: Barriers Resolved   Patient Goals and CMS Choice   CMS Medicare.gov Compare Post Acute Care list provided to:: Other (Comment Required) Choice offered to / list presented to : Adult Children  Discharge Placement                    Patient and family notified of of transfer: 03/30/21  Discharge Plan and Services In-house Referral: Clinical Social Work                                   Social Determinants of Health (SDOH) Interventions     Readmission Risk Interventions No flowsheet data found.

## 2021-03-30 NOTE — Discharge Instructions (Addendum)
DO NOT DRIVE TILL you are cleared to by your PCP or Neurologist.  Check your blood sugars and keep log of it at home

## 2021-03-30 NOTE — TOC Progression Note (Addendum)
Transition of Care [facility]) - Progression Note    Patient Details  Name: Chase Rodriguez MRN: [id number] Date of Birth: 02-Apr-1961  Transition of Care [facility]) CM/SW [place], LCSW Phone Number: 03/30/2021, 10:35 AM  Clinical Narrative:  [place]. Patient's prescriptions will cost $73.38 which he says he cannot afford. [facility]. Will ask volunteer services to pick up and bring to unit when ready.   12:23 pm: Medications are ready. Asked volunteer services to pick up. [facility] closes from 12:30-1:30 for lunch.  Expected Discharge Plan: [place] Barriers to Discharge: Inadequate or no insurance, Continued Medical Work up  Expected Discharge Plan and Services Expected Discharge Plan: [place] In-house Referral: Clinical Social Work     Living arrangements for the past 2 months: Mobile Home Expected Discharge Date: 03/30/21                                     Social Determinants of Health (SDOH) Interventions    Readmission Risk Interventions No flowsheet data found.

## 2021-03-30 NOTE — Plan of Care (Signed)
Nutrition Education Note   RD consulted for nutrition education regarding diabetes.   60 y.o. male with h/o DM type II uncontrolled with complication, DM nephropathy, CKD stage IIIb, essential HTN, HLD and hepatitis who is admitted with syncopal episode/seizure.   Lab Results  Component Value Date   HGBA1C 12.5 (H) 03/25/2021    RD provided "Nutrition and Type II Diabetes" handout from the Academy of Nutrition and Dietetics. Discussed different food groups and their effects on blood sugar, emphasizing carbohydrate-containing foods. Provided list of carbohydrates and recommended serving sizes of common foods.  Discussed importance of controlled and consistent carbohydrate intake throughout the day. Provided examples of ways to balance meals/snacks and encouraged intake of high-fiber, whole grain complex carbohydrates. Teach back method used.  Expect poor compliance.  Body mass index is 32.29 kg/m. Pt meets criteria for obesity  based on current BMI.  Current diet order is HH/CHO, patient is consuming approximately 100% of meals at this time. Labs and medications reviewed. No further nutrition interventions warranted at this time. RD contact information provided. If additional nutrition issues arise, please re-consult RD.  Koleen Distance MS, RD, LDN Please refer to [facility] for RD and/or RD on-call/weekend/after hours pager

## 2021-03-30 NOTE — Progress Notes (Signed)
AVS reviewed as well Insulin administration reinforced. Pt has self administered while in hospital. Pt encouraged to monitor blood sugars and s/s of hyper/hypoglycemia. F/u appointments reviewed and pt denies any concerns at this time.

## 2021-03-30 NOTE — Progress Notes (Signed)
[place] for Electrolyte Monitoring and Replacement   Recent Labs: Potassium (mmol/L)  Date Value  03/30/2021 3.9   Magnesium (mg/dL)  Date Value  03/30/2021 1.6 (L)   Calcium (mg/dL)  Date Value  03/30/2021 7.5 (L)   Albumin (g/dL)  Date Value  03/30/2021 2.6 (L)   Phosphorus (mg/dL)  Date Value  03/30/2021 4.9 (H)   Sodium (mmol/L)  Date Value  03/30/2021 139   Corrected Ca: 8.4 mg/dL  Assessment: Pharmacy has been consulted to monitor electrolytes in 59yo patient with epidural and subdural hematoma following a witnessed fall possibly d/t seizure activity.   IVF: LR w/ 40 mEq KCL@75ml /hr  Goal of Therapy: Potassium 4.0 - 5.1 mmol/L Magnesium 2.0 - 2.4 mg/dL All Other Electrolytes WNL   Plan:  MIVF stopped by MD Magnesium sulfate 4 grams IV x 1 This patient has orders to discharge home: no more follow-up required unless discharge cancelled  Dallie Piles, PharmD,BCPS Clinical Pharmacist 03/30/2021 8:09 AM

## 2021-03-30 NOTE — Plan of Care (Signed)

## 2021-04-13 ENCOUNTER — Other Ambulatory Visit: Payer: Self-pay | Admitting: Neurosurgery

## 2021-04-13 DIAGNOSIS — S064XAS Epidural hemorrhage with loss of consciousness status unknown, sequela: Secondary | ICD-10-CM

## 2021-04-13 DIAGNOSIS — S064XAA Epidural hemorrhage with loss of consciousness status unknown, initial encounter: Secondary | ICD-10-CM

## 2021-05-11 ENCOUNTER — Ambulatory Visit
Admission: RE | Admit: 2021-05-11 | Discharge: 2021-05-11 | Disposition: A | Discharge status: Home / Self Care | Payer: Medicaid Other | Source: Ambulatory Visit | Attending: Neurosurgery | Admitting: Neurosurgery

## 2021-05-11 DIAGNOSIS — S064XAA Epidural hemorrhage with loss of consciousness status unknown, initial encounter: Secondary | ICD-10-CM

## 2021-05-11 IMAGING — CT CT HEAD W/O CM
3 of 5 series · 15 of 47 positions shown, 18 images · non-contrast
Comparison: [DATE]

CLINICAL DATA: Follow-up hematoma

EXAM:
CT HEAD WITHOUT CONTRAST
TECHNIQUE: Contiguous axial images were obtained from the base of the skull
through the vertex without intravenous contrast.

[Series 2: brain lab · axial · 0.49mm/px · z∈[-95,+80]mm · 9 of 201 slices shown, 12 images]
[im 13/201  brain]
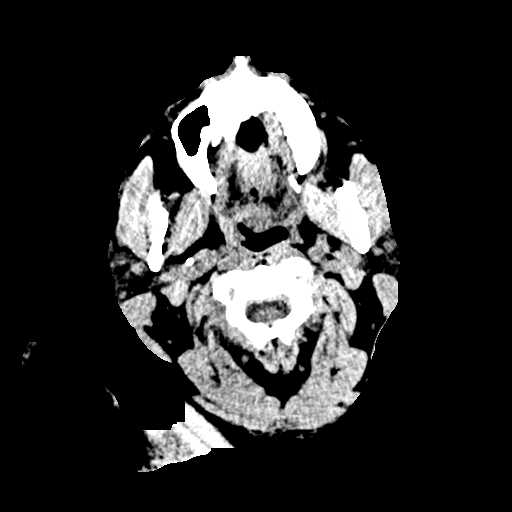
[im 13/201  bone]
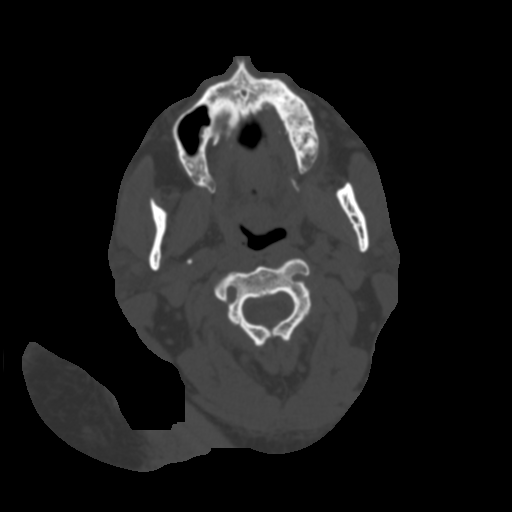
[im 38/201  brain]
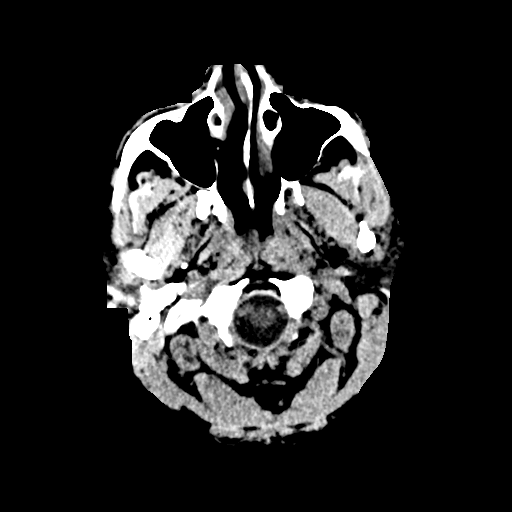
[im 63/201  brain]
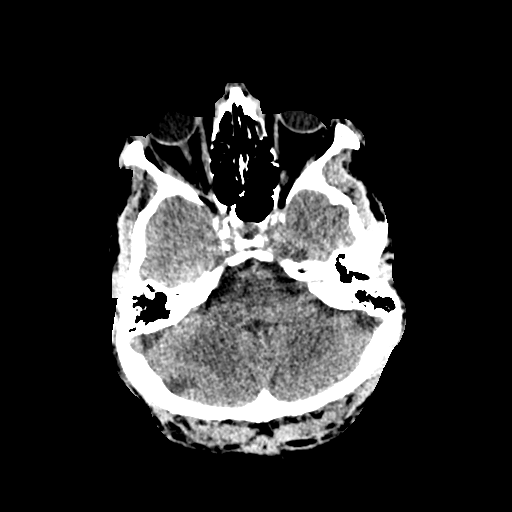
[im 76/201  brain]
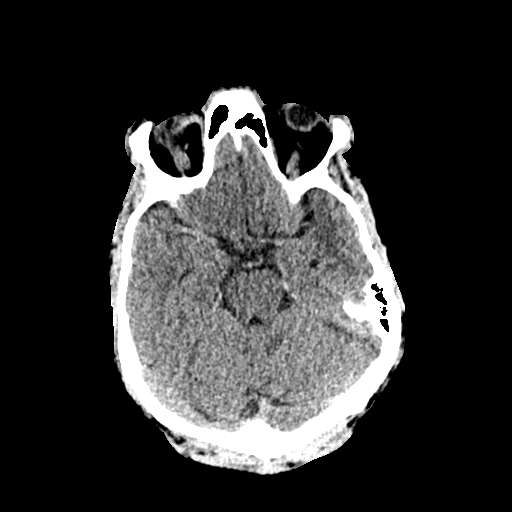
[im 101/201  brain]
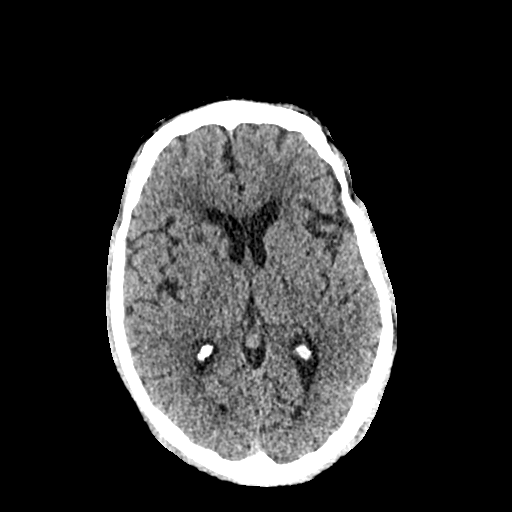
[im 101/201  bone]
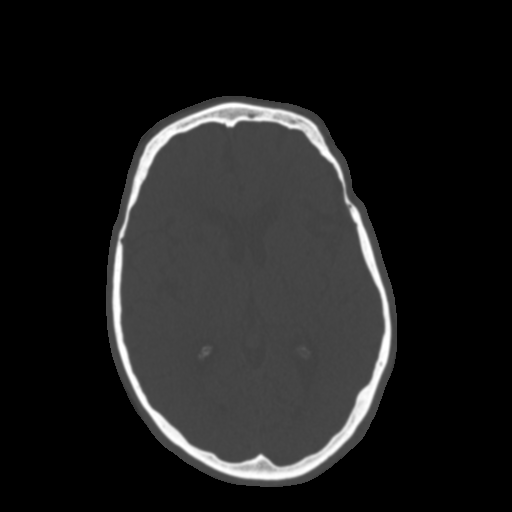
[im 126/201  brain]
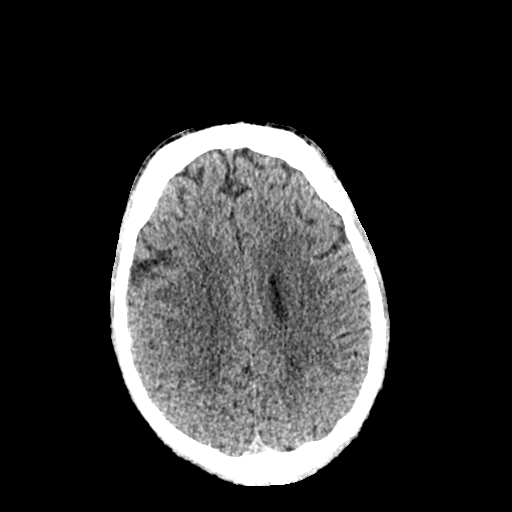
[im 138/201  brain]
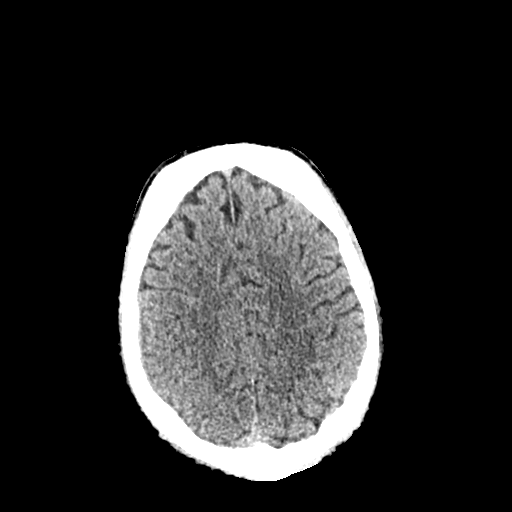
[im 163/201  brain]
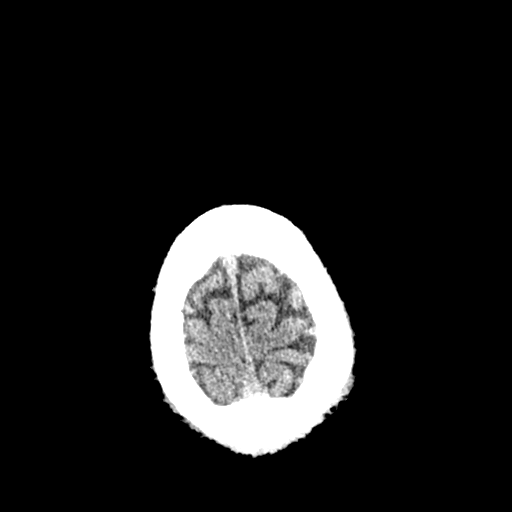
[im 188/201  brain]
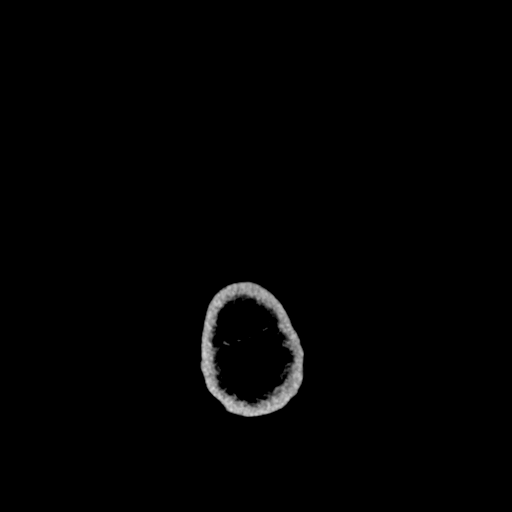
[im 188/201  bone]
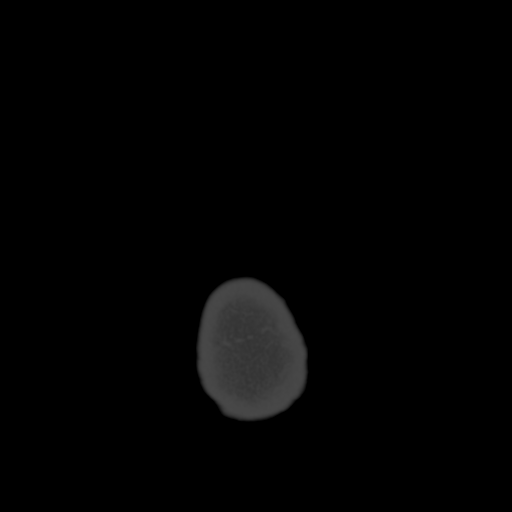

[Series 5: coronal soft tissue · coronal · 0.31mm/px · 3 of 75 slices shown]
[im 25/75  brain]
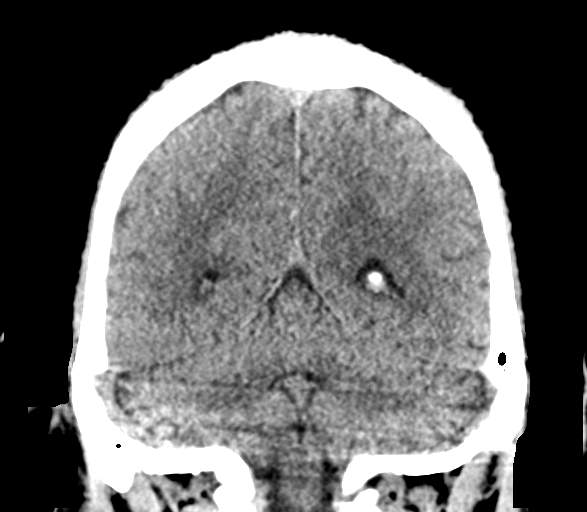
[im 33/75  brain]
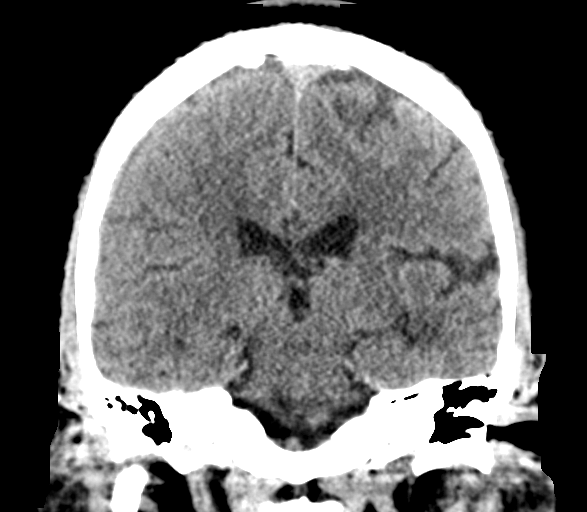
[im 42/75  brain]
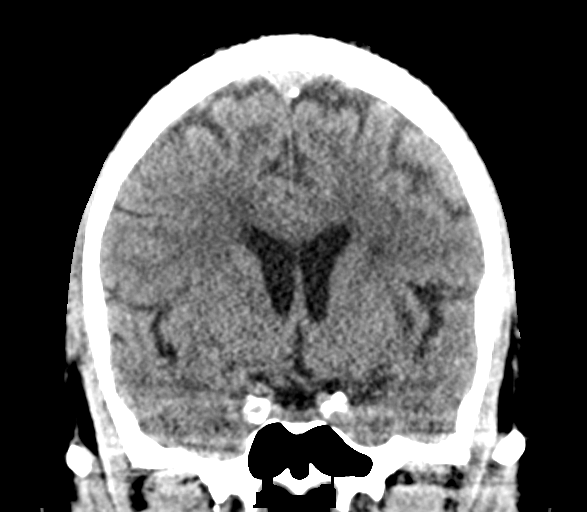

[Series 6: sagittal soft tissue · sagittal · 0.30mm/px · 3 of 61 slices shown]
[im 21/61  brain]
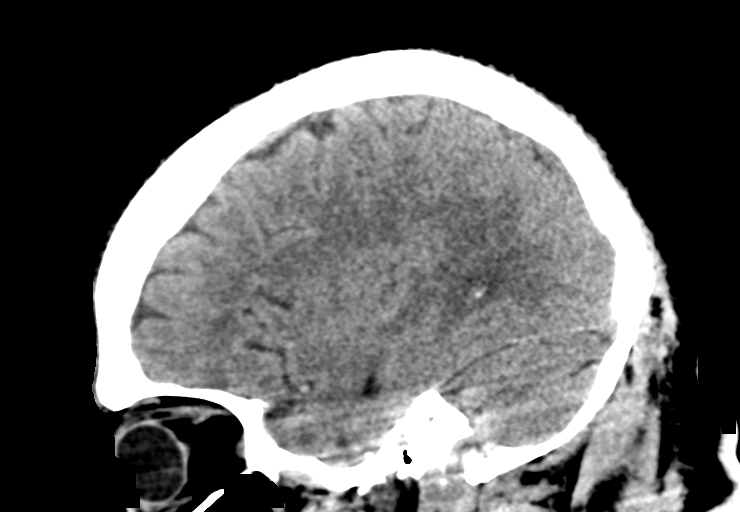
[im 31/61  brain]
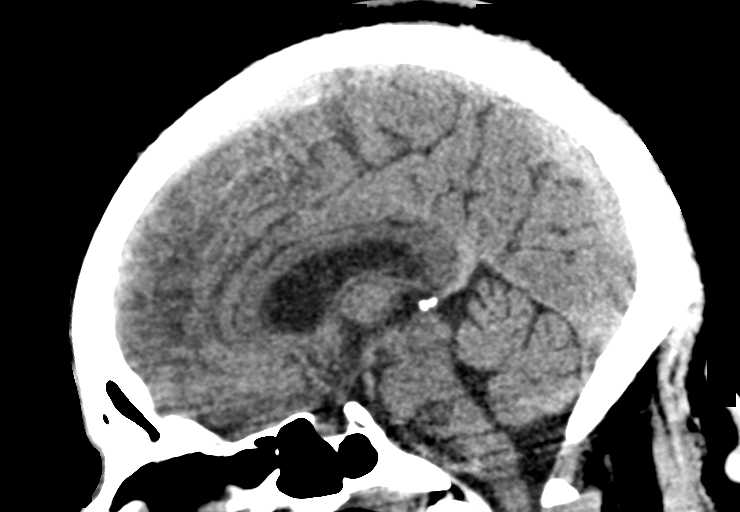
[im 41/61  brain]
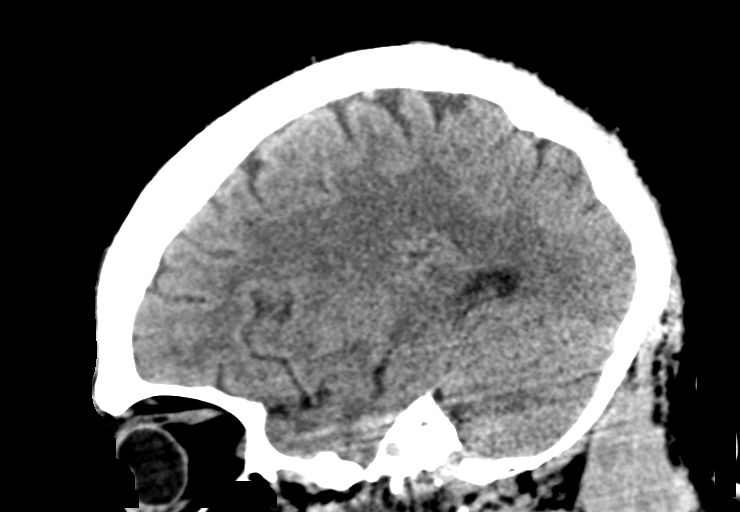

[15 of 47 positions shown; findings below may reference images not displayed]

FINDINGS: Brain: Trace isodense dural based thickening along the right
cerebral convexity in place of the high-density hematoma seen
previously. This finding is only seen on coronal reformats and
measured on image 42 of series 5. No mass effect or acute blood
products. No infarct, hydrocephalus, or masslike finding. Chronic
small vessel ischemia in the hemispheric white matter

Vascular: No hyperdense vessel or unexpected calcification.

Skull: Normal. Negative for fracture or focal lesion.

Sinuses/Orbits: No acute finding.
IMPRESSION: Nearly resolved subdural hematoma on the right with only trace dural
thickening or low-density collection not causing any mass effect.

## 2021-05-23 ENCOUNTER — Other Ambulatory Visit: Payer: Self-pay

## 2021-05-23 ENCOUNTER — Ambulatory Visit: Payer: Self-pay | Admitting: Neurology

## 2021-05-26 ENCOUNTER — Other Ambulatory Visit: Payer: Self-pay

## 2021-05-30 ENCOUNTER — Other Ambulatory Visit: Payer: Self-pay

## 2021-06-01 ENCOUNTER — Other Ambulatory Visit: Payer: Self-pay

## 2021-06-02 ENCOUNTER — Other Ambulatory Visit: Payer: Self-pay

## 2021-06-02 MED ORDER — RIGHTEST GS550 BLOOD GLUCOSE VI STRP
ORAL_STRIP | 11 refills | Status: DC
  Filled 2021-06-02: qty 100, 25d supply, fill #0
  Filled 2021-06-21: qty 100, 25d supply, fill #1
  Filled 2021-10-02: qty 100, 25d supply, fill #2
  Filled 2022-01-09: qty 100, 25d supply, fill #0

## 2021-06-02 MED ORDER — RIGHTEST GM550 BLOOD GLUCOSE W/DEVICE KIT
PACK | 0 refills | Status: AC
  Filled 2021-06-02: qty 1, 1d supply, fill #0

## 2021-06-02 MED ORDER — RIGHTEST GL300 LANCETS MISC
11 refills | Status: AC
  Filled 2021-06-02: qty 100, 25d supply, fill #0
  Filled 2021-06-26: qty 100, 25d supply, fill #1
  Filled 2021-10-02: qty 100, 25d supply, fill #2
  Filled 2022-01-09: qty 100, 25d supply, fill #0

## 2021-06-21 ENCOUNTER — Other Ambulatory Visit: Payer: Self-pay

## 2021-06-21 ENCOUNTER — Ambulatory Visit: Payer: Self-pay | Admitting: Pharmacy Technician

## 2021-06-21 DIAGNOSIS — Z79899 Other long term (current) drug therapy: Secondary | ICD-10-CM

## 2021-06-21 NOTE — Progress Notes (Signed)
Completed [facility] application and contract.  Patient agreed to all terms of the [facility] contract.    Patient approved to receive medication assistance at [facility] until time for re-certification in 2761, and as long as eligibility criteria continues to be met.    Provided patient with Civil engineer, contracting based on his particular needs.    Referred patient to [facility].  [place] [facility]

## 2021-06-22 ENCOUNTER — Telehealth: Payer: Self-pay | Admitting: Gerontology

## 2021-06-22 NOTE — Telephone Encounter (Signed)
Lmom to inform patient of New Patient appointment on 2/9 at 0930.

## 2021-06-23 ENCOUNTER — Other Ambulatory Visit: Payer: Self-pay

## 2021-06-26 ENCOUNTER — Other Ambulatory Visit: Payer: Self-pay

## 2021-06-26 MED ORDER — FENOFIBRATE 160 MG PO TABS
160.0000 mg | ORAL_TABLET | Freq: Every day | ORAL | 2 refills | Status: DC
  Filled 2021-06-26: qty 30, 30d supply, fill #0

## 2021-06-26 MED ORDER — CARVEDILOL 6.25 MG PO TABS
6.2500 mg | ORAL_TABLET | Freq: Two times a day (BID) | ORAL | 0 refills | Status: DC
  Filled 2021-06-26: qty 60, 30d supply, fill #0

## 2021-06-26 MED ORDER — POTASSIUM CHLORIDE CRYS ER 20 MEQ PO TBCR
20.0000 meq | EXTENDED_RELEASE_TABLET | Freq: Every day | ORAL | 1 refills | Status: DC
  Filled 2021-06-26: qty 30, 30d supply, fill #0

## 2021-06-27 ENCOUNTER — Other Ambulatory Visit: Payer: Self-pay

## 2021-06-27 MED ORDER — BD VEO INSULIN SYRINGE U/F 31G X 15/64" 0.5 ML MISC
99 refills | Status: AC
Start: 2021-06-27 — End: ?
  Filled 2021-06-27: qty 60, 30d supply, fill #0

## 2021-07-03 ENCOUNTER — Encounter: Payer: Self-pay | Admitting: Neurology

## 2021-07-03 ENCOUNTER — Ambulatory Visit: Payer: Self-pay | Admitting: Neurology

## 2021-07-06 ENCOUNTER — Ambulatory Visit: Payer: Self-pay | Admitting: Gerontology

## 2021-07-17 ENCOUNTER — Other Ambulatory Visit: Payer: Self-pay

## 2021-07-31 ENCOUNTER — Other Ambulatory Visit: Payer: Self-pay

## 2021-09-05 ENCOUNTER — Other Ambulatory Visit: Payer: Self-pay

## 2021-09-07 ENCOUNTER — Other Ambulatory Visit: Payer: Self-pay

## 2021-09-29 ENCOUNTER — Other Ambulatory Visit: Payer: Self-pay

## 2021-10-02 ENCOUNTER — Other Ambulatory Visit: Payer: Self-pay

## 2021-10-03 ENCOUNTER — Other Ambulatory Visit: Payer: Self-pay

## 2021-10-04 ENCOUNTER — Other Ambulatory Visit: Payer: Self-pay

## 2021-10-06 ENCOUNTER — Other Ambulatory Visit: Payer: Self-pay

## 2021-10-09 ENCOUNTER — Other Ambulatory Visit: Payer: Self-pay

## 2021-10-10 ENCOUNTER — Other Ambulatory Visit: Payer: Self-pay

## 2021-10-11 ENCOUNTER — Other Ambulatory Visit: Payer: Self-pay

## 2021-10-11 MED ORDER — AMLODIPINE BESYLATE 10 MG PO TABS
10.0000 mg | ORAL_TABLET | Freq: Every day | ORAL | 3 refills | Status: AC
  Filled 2021-12-12: qty 30, 30d supply, fill #0
  Filled 2022-01-09: qty 30, 30d supply, fill #1

## 2021-10-11 MED ORDER — LISINOPRIL 40 MG PO TABS
ORAL_TABLET | Freq: Every day | ORAL | 2 refills | Status: DC
  Filled 2021-11-09: qty 30, 30d supply, fill #0

## 2021-10-12 ENCOUNTER — Other Ambulatory Visit: Payer: Self-pay

## 2021-11-09 ENCOUNTER — Other Ambulatory Visit: Payer: Self-pay

## 2021-12-05 ENCOUNTER — Other Ambulatory Visit: Payer: Self-pay

## 2021-12-05 ENCOUNTER — Inpatient Hospital Stay: Payer: 59

## 2021-12-05 ENCOUNTER — Encounter: Payer: Self-pay | Admitting: Emergency Medicine

## 2021-12-05 ENCOUNTER — Inpatient Hospital Stay
Admission: EM | Admit: 2021-12-05 | Discharge: 2021-12-06 | DRG: 641 | Disposition: A | Discharge status: Home / Self Care | Payer: 59 | Attending: Internal Medicine | Admitting: Internal Medicine

## 2021-12-05 DIAGNOSIS — N1832 Chronic kidney disease, stage 3b: Secondary | ICD-10-CM | POA: Diagnosis not present

## 2021-12-05 DIAGNOSIS — I129 Hypertensive chronic kidney disease with stage 1 through stage 4 chronic kidney disease, or unspecified chronic kidney disease: Secondary | ICD-10-CM | POA: Diagnosis present

## 2021-12-05 DIAGNOSIS — N2581 Secondary hyperparathyroidism of renal origin: Secondary | ICD-10-CM | POA: Diagnosis present

## 2021-12-05 DIAGNOSIS — F17210 Nicotine dependence, cigarettes, uncomplicated: Secondary | ICD-10-CM | POA: Diagnosis present

## 2021-12-05 DIAGNOSIS — R569 Unspecified convulsions: Secondary | ICD-10-CM

## 2021-12-05 DIAGNOSIS — N281 Cyst of kidney, acquired: Secondary | ICD-10-CM | POA: Diagnosis not present

## 2021-12-05 DIAGNOSIS — D631 Anemia in chronic kidney disease: Secondary | ICD-10-CM | POA: Diagnosis present

## 2021-12-05 DIAGNOSIS — E1122 Type 2 diabetes mellitus with diabetic chronic kidney disease: Secondary | ICD-10-CM | POA: Diagnosis present

## 2021-12-05 DIAGNOSIS — I10 Essential (primary) hypertension: Secondary | ICD-10-CM | POA: Diagnosis not present

## 2021-12-05 DIAGNOSIS — Z7984 Long term (current) use of oral hypoglycemic drugs: Secondary | ICD-10-CM

## 2021-12-05 DIAGNOSIS — Z79899 Other long term (current) drug therapy: Secondary | ICD-10-CM

## 2021-12-05 DIAGNOSIS — E876 Hypokalemia: Secondary | ICD-10-CM | POA: Diagnosis not present

## 2021-12-05 DIAGNOSIS — E1165 Type 2 diabetes mellitus with hyperglycemia: Secondary | ICD-10-CM

## 2021-12-05 DIAGNOSIS — E7800 Pure hypercholesterolemia, unspecified: Secondary | ICD-10-CM | POA: Diagnosis not present

## 2021-12-05 DIAGNOSIS — Z794 Long term (current) use of insulin: Secondary | ICD-10-CM

## 2021-12-05 DIAGNOSIS — N179 Acute kidney failure, unspecified: Secondary | ICD-10-CM | POA: Diagnosis present

## 2021-12-05 DIAGNOSIS — F17200 Nicotine dependence, unspecified, uncomplicated: Secondary | ICD-10-CM | POA: Diagnosis present

## 2021-12-05 DIAGNOSIS — G40909 Epilepsy, unspecified, not intractable, without status epilepticus: Secondary | ICD-10-CM

## 2021-12-05 DIAGNOSIS — N185 Chronic kidney disease, stage 5: Secondary | ICD-10-CM | POA: Diagnosis present

## 2021-12-05 DIAGNOSIS — N183 Chronic kidney disease, stage 3 unspecified: Secondary | ICD-10-CM | POA: Diagnosis present

## 2021-12-05 LAB — CBC WITH DIFFERENTIAL/PLATELET
Abs Immature Granulocytes: 0.01 10*3/uL (ref 0.00–0.07)
Basophils Absolute: 0 10*3/uL (ref 0.0–0.1)
Basophils Relative: 0 %
Eosinophils Absolute: 0.1 10*3/uL (ref 0.0–0.5)
Eosinophils Relative: 1 %
HCT: 34.9 % — ABNORMAL LOW (ref 39.0–52.0)
Hemoglobin: 11.4 g/dL — ABNORMAL LOW (ref 13.0–17.0)
Immature Granulocytes: 0 %
Lymphocytes Relative: 32 %
Lymphs Abs: 2 10*3/uL (ref 0.7–4.0)
MCH: 26.8 pg (ref 26.0–34.0)
MCHC: 32.7 g/dL (ref 30.0–36.0)
MCV: 82.1 fL (ref 80.0–100.0)
Monocytes Absolute: 0.5 10*3/uL (ref 0.1–1.0)
Monocytes Relative: 8 %
Neutro Abs: 3.7 10*3/uL (ref 1.7–7.7)
Neutrophils Relative %: 59 %
Platelets: 245 10*3/uL (ref 150–400)
RBC: 4.25 MIL/uL (ref 4.22–5.81)
RDW: 14.8 % (ref 11.5–15.5)
WBC: 6.3 10*3/uL (ref 4.0–10.5)
nRBC: 0 % (ref 0.0–0.2)

## 2021-12-05 LAB — COMPREHENSIVE METABOLIC PANEL
ALT: 23 U/L (ref 0–44)
AST: 37 U/L (ref 15–41)
Albumin: 3.3 g/dL — ABNORMAL LOW (ref 3.5–5.0)
Alkaline Phosphatase: 45 U/L (ref 38–126)
Anion gap: 10 (ref 5–15)
BUN: 36 mg/dL — ABNORMAL HIGH (ref 6–20)
CO2: 20 mmol/L — ABNORMAL LOW (ref 22–32)
Calcium: 6.8 mg/dL — ABNORMAL LOW (ref 8.9–10.3)
Chloride: 110 mmol/L (ref 98–111)
Creatinine, Ser: 4.69 mg/dL — ABNORMAL HIGH (ref 0.61–1.24)
GFR, Estimated: 14 mL/min — ABNORMAL LOW (ref >60)
Glucose, Bld: 91 mg/dL (ref 70–99)
Potassium: 2.6 mmol/L — CL (ref 3.5–5.1)
Sodium: 140 mmol/L (ref 135–145)
Total Bilirubin: 0.4 mg/dL (ref 0.3–1.2)
Total Protein: 6.7 g/dL (ref 6.5–8.1)

## 2021-12-05 LAB — CBG MONITORING, ED: Glucose-Capillary: 172 mg/dL — ABNORMAL HIGH (ref 70–99)

## 2021-12-05 LAB — CK: Total CK: 1580 U/L — ABNORMAL HIGH (ref 49–397)

## 2021-12-05 LAB — MAGNESIUM: Magnesium: 1.2 mg/dL — ABNORMAL LOW (ref 1.7–2.4)

## 2021-12-05 MED ORDER — HEPARIN SODIUM (PORCINE) 5000 UNIT/ML IJ SOLN
5000.0000 [IU] | Freq: Three times a day (TID) | INTRAMUSCULAR | Status: DC
  Administered 2021-12-05 – 2021-12-06 (×3): 5000 [IU] via SUBCUTANEOUS
  Filled 2021-12-05 (×3): qty 1

## 2021-12-05 MED ORDER — SODIUM CHLORIDE 0.45 % IV SOLN
INTRAVENOUS | Status: DC
Start: 2021-12-05 — End: 2021-12-06

## 2021-12-05 MED ORDER — SODIUM CHLORIDE 0.9 % IV BOLUS
1000.0000 mL | Freq: Once | INTRAVENOUS | Status: AC
Start: 2021-12-05 — End: 2021-12-05
  Administered 2021-12-05: 1000 mL via INTRAVENOUS

## 2021-12-05 MED ORDER — LABETALOL HCL 100 MG PO TABS
150.0000 mg | ORAL_TABLET | Freq: Two times a day (BID) | ORAL | Status: DC
  Administered 2021-12-05 – 2021-12-06 (×3): 150 mg via ORAL
  Filled 2021-12-05 (×4): qty 1.5

## 2021-12-05 MED ORDER — NICOTINE 21 MG/24HR TD PT24
21.0000 mg | MEDICATED_PATCH | Freq: Every day | TRANSDERMAL | Status: DC
  Administered 2021-12-05 – 2021-12-06 (×2): 21 mg via TRANSDERMAL
  Filled 2021-12-05 (×2): qty 1

## 2021-12-05 MED ORDER — POTASSIUM CHLORIDE CRYS ER 20 MEQ PO TBCR
40.0000 meq | EXTENDED_RELEASE_TABLET | Freq: Once | ORAL | Status: AC
  Administered 2021-12-05: 40 meq via ORAL
  Filled 2021-12-05: qty 2

## 2021-12-05 MED ORDER — LEVETIRACETAM 500 MG PO TABS
500.0000 mg | ORAL_TABLET | Freq: Two times a day (BID) | ORAL | Status: DC
  Administered 2021-12-05 – 2021-12-06 (×3): 500 mg via ORAL
  Filled 2021-12-05 (×3): qty 1

## 2021-12-05 MED ORDER — HYDRALAZINE HCL 20 MG/ML IJ SOLN
10.0000 mg | Freq: Once | INTRAMUSCULAR | Status: AC
  Administered 2021-12-05: 10 mg via INTRAVENOUS
  Filled 2021-12-05: qty 1

## 2021-12-05 MED ORDER — AMLODIPINE BESYLATE 10 MG PO TABS
10.0000 mg | ORAL_TABLET | Freq: Every day | ORAL | Status: DC
  Administered 2021-12-05 – 2021-12-06 (×2): 10 mg via ORAL
  Filled 2021-12-05: qty 1
  Filled 2021-12-05: qty 2

## 2021-12-05 MED ORDER — ACETAMINOPHEN 650 MG RE SUPP: 650.0000 mg | Freq: Four times a day (QID) | RECTAL | Status: DC | PRN

## 2021-12-05 MED ORDER — POTASSIUM CHLORIDE CRYS ER 20 MEQ PO TBCR
40.0000 meq | EXTENDED_RELEASE_TABLET | Freq: Two times a day (BID) | ORAL | Status: AC
Start: 2021-12-05 — End: 2021-12-05
  Administered 2021-12-05 (×2): 40 meq via ORAL
  Filled 2021-12-05 (×2): qty 2

## 2021-12-05 MED ORDER — FENOFIBRATE 160 MG PO TABS: 160.0000 mg | ORAL_TABLET | Freq: Every day | ORAL | Status: DC

## 2021-12-05 MED ORDER — ONDANSETRON HCL 4 MG PO TABS: 4.0000 mg | ORAL_TABLET | Freq: Four times a day (QID) | ORAL | Status: DC | PRN

## 2021-12-05 MED ORDER — ACETAMINOPHEN 325 MG PO TABS: 650.0000 mg | ORAL_TABLET | Freq: Four times a day (QID) | ORAL | Status: DC | PRN

## 2021-12-05 MED ORDER — ONDANSETRON HCL 4 MG/2ML IJ SOLN: 4.0000 mg | Freq: Four times a day (QID) | INTRAMUSCULAR | Status: DC | PRN

## 2021-12-05 MED ORDER — MAGNESIUM SULFATE 2 GM/50ML IV SOLN
2.0000 g | Freq: Once | INTRAVENOUS | Status: AC
  Administered 2021-12-05: 2 g via INTRAVENOUS
  Filled 2021-12-05: qty 50

## 2021-12-05 MED ORDER — LABETALOL HCL 100 MG PO TABS
150.0000 mg | ORAL_TABLET | Freq: Two times a day (BID) | ORAL | Status: DC
  Filled 2021-12-05: qty 1.5

## 2021-12-05 MED ORDER — ATORVASTATIN CALCIUM 20 MG PO TABS
20.0000 mg | ORAL_TABLET | Freq: Every day | ORAL | Status: DC
  Administered 2021-12-05: 20 mg via ORAL
  Filled 2021-12-05: qty 1

## 2021-12-05 NOTE — Assessment & Plan Note (Signed)
Smoking cessation has been discussed with patient in detail We will place patient on a nicotine transdermal patch 21 mg daily 

## 2021-12-05 NOTE — Consult Note (Signed)
[facility]  CONSULT NOTE    Date: 12/05/2021                  Patient Name:  Chase Rodriguez  MRN: [id number]  DOB: 31-Jul-1960  Age / Sex: 61 y.o., male         PCP: [facility] [place]                 Service Requesting Consult: [facility]                 Reason for Consult: Acute kidney injury            History of Present Illness: [name] is a 61 y.o.  male with past medical history of diabetes, hypertension, nicotine dependence, dyslipidemia, and chronic kidney disease stage IIIb, who was admitted to [facility] on 12/05/2021 for AKI (acute kidney injury) [facility]) [N17.9]  Patient presents to the emergency department at the advice of his primary doctor.  Patient states he received a phone call this morning from his primary care physician to report to the emergency room due to abnormal labs.  On presentation patient's potassium 2.6.  Patient states he has been sick recently.  Reports nausea, vomiting, and diarrhea.  States this normally happens if he drinks beer.  But usually only last for a couple occurrences of each.  States this was not a prolonged event.  He does follow with [facility] nephrology at [facility] but states he has not had an appointment recently.  He is seen and evaluated at the bedside resting on stretcher.  Girlfriend and brother at bedside.  He has multiple questions about possible causes to his low potassium including drinking dark sodas.  Denies known fever or chills.  Denies shortness of breath, remains on room air at this time.  Denies NSAID use.  Labs on ED arrival include potassium 2.6, BUN 36, creatinine 4.62 with GFR 14, calcium 6.8, magnesium 1.2, and hemoglobin 11.4.  CK elevated 1580.  Baseline creatinine appears to be 2.04 with GFR 37 on 03/30/2021.  Medications: Outpatient medications: (Not in a hospital admission)   Current medications: Current Facility-Administered Medications  Medication Dose Route Frequency  Provider Last Rate Last Admin   0.45 % sodium chloride infusion   Intravenous Continuous Agbata, Tochukwu, MD 100 mL/hr at 12/05/21 1354 New Bag at 12/05/21 1354   acetaminophen (TYLENOL) tablet 650 mg  650 mg Oral Q6H PRN Agbata, Tochukwu, MD       Or   acetaminophen (TYLENOL) suppository 650 mg  650 mg Rectal Q6H PRN Agbata, Tochukwu, MD       amLODipine (NORVASC) tablet 10 mg  10 mg Oral Daily Agbata, Tochukwu, MD   10 mg at 12/05/21 1347   atorvastatin (LIPITOR) tablet 20 mg  20 mg Oral Daily Agbata, Tochukwu, MD       heparin injection 5,000 Units  5,000 Units Subcutaneous Q8H Agbata, Tochukwu, MD   5,000 Units at 12/05/21 1354   labetalol (NORMODYNE) tablet 150 mg  150 mg Oral BID Agbata, Tochukwu, MD   150 mg at 12/05/21 1446   levETIRAcetam (KEPPRA) tablet 500 mg  500 mg Oral BID Agbata, Tochukwu, MD   500 mg at 12/05/21 1348   nicotine (NICODERM CQ - dosed in mg/24 hours) patch 21 mg  21 mg Transdermal Daily Agbata, Tochukwu, MD   21 mg at 12/05/21 1444   ondansetron (ZOFRAN) tablet 4 mg  4 mg Oral Q6H PRN  Collier Bullock, MD       Or   ondansetron (ZOFRAN) injection 4 mg  4 mg Intravenous Q6H PRN Agbata, Tochukwu, MD       potassium chloride SA (KLOR-CON M) CR tablet 40 mEq  40 mEq Oral BID Agbata, Tochukwu, MD   40 mEq at 12/05/21 1443   Current Outpatient Medications  Medication Sig Dispense Refill   amLODipine (NORVASC) 10 MG tablet Take 1 tablet (10 mg total) by mouth once daily. 30 tablet 3   empagliflozin (JARDIANCE) 25 MG TABS tablet Take by mouth daily.     insulin detemir (LEVEMIR) 100 UNIT/ML injection Inject 0.2 mLs (20 Units total) into the skin 2 (two) times daily. 10 mL 11   levETIRAcetam (KEPPRA) 500 MG tablet Take 1 tablet (500 mg total) by mouth 2 (two) times daily. 60 tablet 5   lisinopril (ZESTRIL) 40 MG tablet Take 1 tablet (40 mg total) by mouth once daily. 30 tablet 2   atorvastatin (LIPITOR) 20 MG tablet Take 1 tablet (20 mg total) by mouth once daily.  (Patient not taking: Reported on 12/05/2021) 30 tablet 10   Blood Glucose Monitoring Suppl (RIGHTEST GM550 BLOOD GLUCOSE) w/Device KIT Use as directed to check blood sugar 1 kit 0   fenofibrate 160 MG tablet Take 1 tablet (160 mg total) by mouth once daily. (Patient not taking: Reported on 12/05/2021) 30 tablet 2   glucose blood (RIGHTEST GS550 BLOOD GLUCOSE) test strip Use as directed to test blood sugar 100 each 11   Insulin Syringe-Needle U-100 (BD VEO INSULIN SYRINGE U/F) 31G X 15/64" 0.5 ML MISC USE AS DIRECTED 60 each PRN   labetalol (NORMODYNE) 300 MG tablet Take (1/2) tablet (150 mg total) by mouth 2 (two) times daily. (Patient not taking: Reported on 12/05/2021) 60 tablet 3   Rightest GL300 Lancets MISC Use as directed to test blood sugar 100 each 11      Allergies: No Known Allergies    Past Medical History: Past Medical History:  Diagnosis Date   Diabetes mellitus without complication (HCC)    Hepatitis    High cholesterol    Hypertension    Stage 3b chronic kidney disease (CKD) ([place])      Past Surgical History: Past Surgical History:  Procedure Laterality Date   TONSILLECTOMY       Family History: No family history on file.   Social History: Social History   Socioeconomic History   Marital status: Single    Spouse name: Not on file   Number of children: Not on file   Years of education: Not on file   Highest education level: Not on file  Occupational History   Not on file  Tobacco Use   Smoking status: Every Day    Packs/day: 0.50    Years: 39.00    Total pack years: 19.50    Types: Cigarettes   Smokeless tobacco: Never  Substance and Sexual Activity   Alcohol use: Yes    Comment: sometimes 1-2 drinks   Drug use: No   Sexual activity: Not on file  Other Topics Concern   Not on file  Social History Narrative   Not on file   Social Determinants of Health   Financial Resource Strain: Not on file  Food Insecurity: Not on file  Transportation  Needs: Not on file  Physical Activity: Not on file  Stress: Not on file  Social Connections: Not on file  Intimate Partner Violence: Not on file  Review of Systems: Review of Systems  Constitutional:  Negative for chills, fever and malaise/fatigue.       Abnormal labs  HENT:  Negative for congestion, sore throat and tinnitus.   Eyes:  Negative for blurred vision and redness.  Respiratory:  Negative for cough, shortness of breath and wheezing.   Cardiovascular:  Negative for chest pain, palpitations, claudication and leg swelling.  Gastrointestinal:  Negative for abdominal pain, blood in stool, diarrhea, nausea and vomiting.  Genitourinary:  Negative for flank pain, frequency and hematuria.  Musculoskeletal:  Negative for back pain, falls and myalgias.  Skin:  Negative for rash.  Neurological:  Negative for dizziness, weakness and headaches.  Endo/Heme/Allergies:  Does not bruise/bleed easily.  Psychiatric/Behavioral:  Negative for depression. The patient is not nervous/anxious and does not have insomnia.     Vital Signs: Blood pressure (!) 194/100, pulse 64, temperature 98.2 F (36.8 C), temperature source Oral, resp. rate 16, height _0  (1.803 m), weight 101.2 kg, SpO2 100 %.  Weight trends: Filed Weights   12/05/21 0951  Weight: 101.2 kg    Physical Exam: General: NAD, resting comfortably  Head: Normocephalic, atraumatic. Moist oral mucosal membranes  Eyes: Anicteric  Lungs:  Clear to auscultation, normal effort, room air  Heart: Regular rate and rhythm  Abdomen:  Soft, nontender, nondistended  Extremities: No peripheral edema.  Neurologic: Nonfocal, moving all four extremities  Skin: No lesions  Access: None      Lab results: Basic Metabolic Panel: Recent Labs  Lab 12/05/21 0953 12/05/21 0959  NA 140  --   K 2.6*  --   CL 110  --   CO2 20*  --   GLUCOSE 91  --   BUN 36*  --   CREATININE 4.69*  --   CALCIUM 6.8*  --   MG  --  1.2*    Liver  Function Tests: Recent Labs  Lab 12/05/21 0953  AST 37  ALT 23  ALKPHOS 45  BILITOT 0.4  PROT 6.7  ALBUMIN 3.3*   No results for input(s): "LIPASE", "AMYLASE" in the last 168 hours. No results for input(s): "AMMONIA" in the last 168 hours.  CBC: Recent Labs  Lab 12/05/21 0953  WBC 6.3  NEUTROABS 3.7  HGB 11.4*  HCT 34.9*  MCV 82.1  PLT 245    Cardiac Enzymes: Recent Labs  Lab 12/05/21 1344  CKTOTAL 1,580*    BNP: Invalid input(s): "POCBNP"  CBG: No results for input(s): "GLUCAP" in the last 168 hours.  Microbiology: Results for orders placed or performed during the hospital encounter of 03/24/21  Resp Panel by RT-PCR (Flu A&B, Covid) Nasopharyngeal Swab     Status: None   Collection Time: 03/24/21  9:37 PM   Specimen: Nasopharyngeal Swab; Nasopharyngeal(NP) swabs in vial transport medium  Result Value Ref Range Status   SARS Coronavirus 2 by RT PCR NEGATIVE NEGATIVE Final    Comment: (NOTE) SARS-CoV-2 target nucleic acids are NOT DETECTED.  The SARS-CoV-2 RNA is generally detectable in upper respiratory specimens during the acute phase of infection. The lowest concentration of SARS-CoV-2 viral copies this assay can detect is 138 copies/mL. A negative result does not preclude SARS-Cov-2 infection and should not be used as the sole basis for treatment or other patient management decisions. A negative result may occur with  improper specimen collection/handling, submission of specimen other than nasopharyngeal swab, presence of viral mutation(s) within the areas targeted by this assay, and inadequate number of viral copies(<138 copies/mL).  A negative result must be combined with clinical observations, patient history, and epidemiological information. The expected result is Negative.  Fact Sheet for Patients:  EntrepreneurPulse.com.au  Fact Sheet for Healthcare Providers:  IncredibleEmployment.be  This test is no t yet  approved or cleared by the [place] FDA and  has been authorized for detection and/or diagnosis of SARS-CoV-2 by FDA under an Emergency Use Authorization (EUA). This EUA will remain  in effect (meaning this test can be used) for the duration of the COVID-19 declaration under Section 564(b)(1) of the Act, 21 U.S.C.section 360bbb-3(b)(1), unless the authorization is terminated  or revoked sooner.       Influenza A by PCR NEGATIVE NEGATIVE Final   Influenza B by PCR NEGATIVE NEGATIVE Final    Comment: (NOTE) The Xpert Xpress SARS-CoV-2/FLU/RSV plus assay is intended as an aid in the diagnosis of influenza from Nasopharyngeal swab specimens and should not be used as a sole basis for treatment. Nasal washings and aspirates are unacceptable for Xpert Xpress SARS-CoV-2/FLU/RSV testing.  Fact Sheet for Patients: EntrepreneurPulse.com.au  Fact Sheet for Healthcare Providers: IncredibleEmployment.be  This test is not yet approved or cleared by the [place] FDA and has been authorized for detection and/or diagnosis of SARS-CoV-2 by FDA under an Emergency Use Authorization (EUA). This EUA will remain in effect (meaning this test can be used) for the duration of the COVID-19 declaration under Section 564(b)(1) of the Act, 21 U.S.C. section 360bbb-3(b)(1), unless the authorization is terminated or revoked.  Performed at [facility], [address] [place], [place] [address]   MRSA Next Gen by PCR, Nasal     Status: None   Collection Time: 03/25/21 12:30 AM   Specimen: Nasal Mucosa; Nasal Swab  Result Value Ref Range Status   MRSA by PCR Next Gen NOT DETECTED NOT DETECTED Final    Comment: (NOTE) The GeneXpert MRSA Assay (FDA approved for NASAL specimens only), is one component of a comprehensive MRSA colonization surveillance program. It is not intended to diagnose MRSA infection nor to guide or monitor treatment for MRSA  infections. Test performance is not FDA approved in patients less than 48 years old. Performed at [facility], [address] [place], [place] [address]     Coagulation Studies: No results for input(s): "LABPROT", "INR" in the last 72 hours.  Urinalysis: No results for input(s): "COLORURINE", "LABSPEC", "PHURINE", "GLUCOSEU", "HGBUR", "BILIRUBINUR", "KETONESUR", "PROTEINUR", "UROBILINOGEN", "NITRITE", "LEUKOCYTESUR" in the last 72 hours.  Invalid input(s): "APPERANCEUR"    Imaging: No results found.   Assessment & Plan: [name] is a 61 y.o.  male with past medical history of diabetes, hypertension, nicotine dependence, dyslipidemia, and chronic kidney disease stage IIIb, who was admitted to [facility] on 12/05/2021 for AKI (acute kidney injury) ([place]) [N17.9]  Acute kidney injury with hypokalemia/Hypomagnesia on chronic kidney disease stage IIIb.  Baseline creatinine appears to be 2.04 with GFR 37 on 03/30/2021.  Patient currently followed by [facility] physicians.  Acute kidney injury likely secondary to hypovolemia from recent nausea, vomiting, and diarrhea.  Chronic kidney disease likely secondary to diabetes and hypertension.  No IV contrast exposure.  Potassium on admission 2.6.  Oral potassium 40 mill equivalents ordered twice daily by primary team.  May consider IV supplementation as well.  Magnesium 1.42, 2 mentation ordered by primary team.  Agree with IV hydration at this time.  Lisinopril held.  Renal ultrasound ordered to evaluate possible obstruction.  No acute need for dialysis at this time.  We will continue to monitor.  Continue to hold nephrotoxic agents and therapies.  2. Anemia of chronic kidney disease Lab Results  Component Value Date   HGB 11.4 (L) 12/05/2021    Hemoglobin within acceptable target.  We will continue to monitor  3. Secondary Hyperparathyroidism:  Lab Results  Component Value Date   CALCIUM 6.8 (L) 12/05/2021   PHOS 4.9 (H) 03/30/2021     Calcium not within acceptable range.  May consider IV supplementation as well.  4.  Hypertension with chronic kidney disease.  Home regimen includes amlodipine, lisinopril, and labetalol.  Lisinopril currently held in setting of kidney injury.  5. Diabetes mellitus type II with chronic kidney disease: insulin dependent. Home regimen includes Levemir. Most recent hemoglobin A1c is 12.5 on 03/25/21.    LOS: 0 Symphanie Cederberg 7/11/20233:14 PM

## 2021-12-05 NOTE — Assessment & Plan Note (Signed)
Most likely related to GI losses Supplement magnesium

## 2021-12-05 NOTE — Assessment & Plan Note (Signed)
Blood pressure is uncontrolled Continue amlodipine and labetalol Hold lisinopril for AKI

## 2021-12-05 NOTE — Assessment & Plan Note (Signed)
Patient noted to have a bump in his serum creatinine Baseline serum creatinine is 2.04 and today on admission it is 4.69 Worsening renal failure appears to be prerenal and related to GI losses from nausea, vomiting and diarrhea We will hydrate patient Repeat renal parameters in a.m. Hold lisinopril Obtain renal ultrasound Consult nephrology

## 2021-12-05 NOTE — ED Provider Notes (Signed)
[facility] Provider Note    Event Date/Time   First MD Initiated Contact with Patient 12/05/21 1014     (approximate)   History   Abnormal Labs   HPI  Chase Rodriguez is a 61 y.o. male  who presents to the emergency department today because of concern for low potassium.  Patient states that he got a call from his doctor's office telling him to come to the emergency department today because of low potassium.  Had blood work done as part of an annual exam.  The patient denied any recent symptoms.  Denies any diarrhea.  States he has occasional episodes of vomiting but nothing frequent.      Physical Exam   Triage Vital Signs: ED Triage Vitals  Enc Vitals Group     BP 12/05/21 0950 (!) 189/100     Pulse Rate 12/05/21 0950 73     Resp 12/05/21 0950 18     Temp 12/05/21 0950 98.2 F (36.8 C)     Temp Source 12/05/21 0950 Oral     SpO2 12/05/21 0950 100 %     Weight 12/05/21 0951 223 lb (101.2 kg)     Height 12/05/21 0951 5\' 11"  (1.803 m)     Head Circumference --      Peak Flow --      Pain Score 12/05/21 0951 0     Pain Loc --      Pain Edu? --      Excl. in [place]? --     Most recent vital signs: Vitals:   12/05/21 0950  BP: (!) 189/100  Pulse: 73  Resp: 18  Temp: 98.2 F (36.8 C)  SpO2: 100%    General: Awake, alert, oriented. CV:  Good peripheral perfusion. Regular rate and rhythm. Resp:  Normal effort. Lungs clear. Abd:  No distention.    ED Results / Procedures / Treatments   Labs (all labs ordered are listed, but only abnormal results are displayed) Labs Reviewed  CBC WITH DIFFERENTIAL/PLATELET - Abnormal; Notable for the following components:      Result Value   Hemoglobin 11.4 (*)    HCT 34.9 (*)    All other components within normal limits  COMPREHENSIVE METABOLIC PANEL - Abnormal; Notable for the following components:   Potassium 2.6 (*)    CO2 20 (*)    BUN 36 (*)    Creatinine, Ser 4.69 (*)    Calcium 6.8 (*)     Albumin 3.3 (*)    GFR, Estimated 14 (*)    All other components within normal limits  MAGNESIUM - Abnormal; Notable for the following components:   Magnesium 1.2 (*)    All other components within normal limits     EKG  I, Nance Pear, attending physician, personally viewed and interpreted this EKG  EKG Time: 1009 Rate: 65 Rhythm: sinus rhythm Axis: normal Intervals: qtc 464 QRS: narrow, q waves v1, v2 ST changes: no st elevation Impression: abnormal ekg     RADIOLOGY None   PROCEDURES:  Critical Care performed: No  Procedures   MEDICATIONS ORDERED IN ED: Medications - No data to display   IMPRESSION / MDM / [place] / ED COURSE  I reviewed the triage vital signs and the nursing notes.                              Differential diagnosis includes, but  is not limited to, low potassium due to medication, GI loss, dietary deficiency.  Patient's presentation is most consistent with acute presentation with potential threat to life or bodily function.  Patient presented to the emergency department today at the advice of primary care because of concerns for low potassium found on blood check.  Patient potassium here is low at 2.6.  Notably however patient has elevation of his creatinine over the most recent one I can find in epic.  Last creatinine was 2 and today it is close to 4.7.  I do have concerns for acute kidney injury.  Initially magnesium was found to be low.  Patient was started on IV fluids, given potassium and magnesium.  Discussed with Dr. Francine Graven with the hospitalist service who will plan on admission.   FINAL CLINICAL IMPRESSION(S) / ED DIAGNOSES   Final diagnoses:  AKI (acute kidney injury) ([place])  Hypokalemia  Hypomagnesemia    Note:  This document was prepared using Dragon voice recognition software and may include unintentional dictation errors.    Nance Pear, MD 12/05/21 [phone]

## 2021-12-05 NOTE — Assessment & Plan Note (Signed)
Stable Continue Keppra Place patient on seizure precautions 

## 2021-12-05 NOTE — Assessment & Plan Note (Signed)
Secondary to GI losses from nausea, vomiting and diarrhea Supplement potassium Check magnesium levels 

## 2021-12-05 NOTE — H&P (Signed)
History and Physical    Patient: Chase Rodriguez EML:[id number] DOB: 08/15/1960 DOA: 12/05/2021 DOS: the patient was seen and examined on 12/05/2021 PCP: [facility]  Patient coming from: Home  Chief Complaint:  Chief Complaint  Patient presents with   Abnormal Labs   HPI: Chase Rodriguez is a 61 y.o. male with medical history significant for diabetes mellitus with stage IIIb chronic kidney disease, hypertension, dyslipidemia, nicotine dependence who was referred to the ER by his primary care provider for evaluation of hypokalemia. Patient had gone for his routine follow-up and had blood work done.  He states that he was called in the morning on the day of his admission and asked to go to the ER for evaluation of low potassium level. Patient has had diarrhea, nausea and vomiting but his symptoms have resolved.  He complains of tingling and numbness in his fingers.   He denies having any chest pain, shortness of breath, no urinary symptom, no headache, no fever, no chills, no leg swelling, no palpitations, no diaphoresis or blurred vision. Labs reveal hypokalemia and hypomagnesemia.  Patient has a baseline serum creatinine of 2.04 but today on admission it is 4.69.  He will be admitted to the hospital for further evaluation.   Review of Systems: As mentioned in the history of present illness. All other systems reviewed and are negative. Past Medical History:  Diagnosis Date   Diabetes mellitus without complication (HCC)    Hepatitis    High cholesterol    Hypertension    Stage 3b chronic kidney disease (CKD) ([place])    Past Surgical History:  Procedure Laterality Date   TONSILLECTOMY     Social History:  reports that he has been smoking cigarettes. He has a 19.50 pack-year smoking history. He has never used smokeless tobacco. He reports current alcohol use. He reports that he does not use drugs.  No Known Allergies  No family history on file.  Prior to Admission  medications   Medication Sig Start Date End Date Taking? Authorizing Provider  amLODipine (NORVASC) 10 MG tablet Take 1 tablet (10 mg total) by mouth once daily. 10/11/21  Yes   empagliflozin (JARDIANCE) 25 MG TABS tablet Take by mouth daily.   Yes [provider]  insulin detemir (LEVEMIR) 100 UNIT/ML injection Inject 0.2 mLs (20 Units total) into the skin 2 (two) times daily. 03/30/21  Yes Fritzi Mandes, MD  levETIRAcetam (KEPPRA) 500 MG tablet Take 1 tablet (500 mg total) by mouth 2 (two) times daily. 03/30/21  Yes Fritzi Mandes, MD  lisinopril (ZESTRIL) 40 MG tablet Take 1 tablet (40 mg total) by mouth once daily. 10/11/21  Yes   atorvastatin (LIPITOR) 20 MG tablet Take 1 tablet (20 mg total) by mouth once daily. Patient not taking: Reported on 12/05/2021 03/30/21   Fritzi Mandes, MD  Blood Glucose Monitoring Suppl (RIGHTEST [phone] BLOOD GLUCOSE) w/Device KIT Use as directed to check blood sugar 06/02/21   Cammy Copa, [facility]  fenofibrate 160 MG tablet Take 1 tablet (160 mg total) by mouth once daily. Patient not taking: Reported on 12/05/2021 10/21/20   Ranae Plumber, PA  glucose blood (RIGHTEST [phone] BLOOD GLUCOSE) test strip Use as directed to test blood sugar 06/02/21   Ellie Lunch K, [facility]  Insulin Syringe-Needle U-100 (BD VEO INSULIN SYRINGE U/F) 31G X 15/64" 0.5 ML MISC USE AS DIRECTED 06/27/21   Cammy Copa, RPH  labetalol (NORMODYNE) 300 MG tablet Take (1/2) tablet (150 mg total) by  mouth 2 (two) times daily. Patient not taking: Reported on 12/05/2021 03/30/21   Fritzi Mandes, MD  Rightest GL300 Lancets MISC Use as directed to test blood sugar 06/02/21   Cammy Copa, RPH  carvedilol (COREG) 6.25 MG tablet Take 1 tablet (6.25 mg total) by mouth 2 (two) times daily. 01/20/21 06/26/21  Ranae Plumber, PA  potassium chloride SA (KLOR-CON M) 20 MEQ tablet Take 1 tablet (20 mEq total) by mouth daily. 10/21/20 06/26/21  Ranae Plumber, PA    Physical Exam: Vitals:   12/05/21 0950 12/05/21 0951  12/05/21 1017 12/05/21 1100  BP: (!) 189/100  (!) 174/99 (!) 193/97  Pulse: 73  66 62  Resp: '18  17 15  ' Temp: 98.2 F (36.8 C)     TempSrc: Oral     SpO2: 100%  100% 100%  Weight:  101.2 kg    Height:  '5\' 11"'  (1.803 m)     Physical Exam Vitals and nursing note reviewed.  Constitutional:      Comments: Chronically ill-appearing.  Facial puffiness  HENT:     Head: Normocephalic and atraumatic.     Nose: Nose normal.     Mouth/Throat:     Mouth: Mucous membranes are moist.  Eyes:     Conjunctiva/sclera: Conjunctivae normal.  Cardiovascular:     Rate and Rhythm: Normal rate and regular rhythm.  Pulmonary:     Effort: Pulmonary effort is normal.     Breath sounds: Normal breath sounds.  Abdominal:     General: Bowel sounds are normal.     Palpations: Abdomen is soft.     Comments: Central adiposity  Musculoskeletal:        General: Normal range of motion.     Cervical back: Normal range of motion and neck supple.  Skin:    General: Skin is warm and dry.  Neurological:     General: No focal deficit present.     Mental Status: He is alert and oriented to person, place, and time.  Psychiatric:        Mood and Affect: Mood normal.        Behavior: Behavior normal.     Data Reviewed: Relevant notes from primary care and specialist visits, past discharge summaries as available in EHR, including Care Everywhere. Prior diagnostic testing as pertinent to current admission diagnoses Updated medications and problem lists for reconciliation ED course, including vitals, labs, imaging, treatment and response to treatment Triage notes, nursing and pharmacy notes and ED provider's notes Notable results as noted in HPI Labs reviewed.  Magnesium 1.2, sodium 140, potassium 2.6, chloride 110, bicarb 20, glucose 91, BUN 36, creatinine 4.69 compared to baseline of 2.04, calcium 6.8, total protein 6.7, albumin 3.3, AST 37, ALT 23, alk phos 45, white count 6.3, hemoglobin 11.4, hematocrit  34.9, MCV 82.1, RDW 14.8, platelet count 245 Twelve-lead EKG reviewed by me shows sinus rhythm with nonspecific T wave abnormalities.  There are no new results to review at this time.  Assessment and Plan: * AKI (acute kidney injury) ([place]) Patient noted to have a bump in his serum creatinine Baseline serum creatinine is 2.04 and today on admission it is 4.69 Worsening renal failure appears to be prerenal and related to GI losses from nausea, vomiting and diarrhea We will hydrate patient Repeat renal parameters in a.m. Hold lisinopril Obtain renal ultrasound Consult nephrology  CKD stage 3 due to type 2 diabetes mellitus ([place]) Patient has diabetes mellitus with complications of stage IIIb chronic  kidney disease. Maintain consistent carbohydrate diet Glycemic control with sliding scale insulin Hold Levemir for now due to AKI  Essential hypertension Blood pressure is uncontrolled Continue amlodipine and labetalol Hold lisinopril for AKI  Seizure ([place]) Stable Continue Keppra Place patient on seizure precautions  Hypomagnesemia Most likely related to GI losses Supplement magnesium   Hypokalemia Secondary to GI losses from nausea, vomiting and diarrhea. Supplement potassium Check magnesium levels  Nicotine dependence Smoking cessation has been discussed with patient in detail We will place patient on a nicotine transdermal patch 21 mg daily      Advance Care Planning:   Code Status: Full Code   Consults: Nephrology  Family Communication: Greater than 50% of time was spent discussing patient's condition and plan of care with him at the bedside.  All questions and concerns have been addressed.  He verbalizes understanding and agrees with the plan.  Severity of Illness: The appropriate patient status for this patient is INPATIENT. Inpatient status is judged to be reasonable and necessary in order to provide the required intensity of service to ensure the patient's  safety. The patient's presenting symptoms, physical exam findings, and initial radiographic and laboratory data in the context of their chronic comorbidities is felt to place them at high risk for further clinical deterioration. Furthermore, it is not anticipated that the patient will be medically stable for discharge from the hospital within 2 midnights of admission.   * I certify that at the point of admission it is my clinical judgment that the patient will require inpatient hospital care spanning beyond 2 midnights from the point of admission due to high intensity of service, high risk for further deterioration and high frequency of surveillance required.*  Author: Collier Bullock, MD 12/05/2021 1:58 PM  For on call review [email or web address].

## 2021-12-05 NOTE — Assessment & Plan Note (Signed)
Patient has diabetes mellitus with complications of stage IIIb chronic kidney disease. Maintain consistent carbohydrate diet Glycemic control with sliding scale insulin Hold Levemir for now due to AKI

## 2021-12-05 NOTE — ED Triage Notes (Signed)
Patient to ED via POV for abnormal labs. Patient was called by doctor's office to come and been seen due to low potassium levels.

## 2021-12-06 ENCOUNTER — Inpatient Hospital Stay: Payer: 59

## 2021-12-06 DIAGNOSIS — E876 Hypokalemia: Principal | ICD-10-CM

## 2021-12-06 DIAGNOSIS — I10 Essential (primary) hypertension: Secondary | ICD-10-CM

## 2021-12-06 LAB — BASIC METABOLIC PANEL
Anion gap: 7 (ref 5–15)
Anion gap: 7 (ref 5–15)
BUN: 32 mg/dL — ABNORMAL HIGH (ref 6–20)
BUN: 30 mg/dL — ABNORMAL HIGH (ref 6–20)
CO2: 20 mmol/L — ABNORMAL LOW (ref 22–32)
CO2: 22 mmol/L (ref 22–32)
Calcium: 6.6 mg/dL — ABNORMAL LOW (ref 8.9–10.3)
Calcium: 6.8 mg/dL — ABNORMAL LOW (ref 8.9–10.3)
Chloride: 114 mmol/L — ABNORMAL HIGH (ref 98–111)
Chloride: 111 mmol/L (ref 98–111)
Creatinine, Ser: 4.37 mg/dL — ABNORMAL HIGH (ref 0.61–1.24)
Creatinine, Ser: 4.17 mg/dL — ABNORMAL HIGH (ref 0.61–1.24)
GFR, Estimated: 15 mL/min — ABNORMAL LOW (ref >60)
GFR, Estimated: 16 mL/min — ABNORMAL LOW (ref >60)
Glucose, Bld: 96 mg/dL (ref 70–99)
Glucose, Bld: 115 mg/dL — ABNORMAL HIGH (ref 70–99)
Potassium: 2.7 mmol/L — CL (ref 3.5–5.1)
Potassium: 2.9 mmol/L — ABNORMAL LOW (ref 3.5–5.1)
Sodium: 141 mmol/L (ref 135–145)
Sodium: 140 mmol/L (ref 135–145)

## 2021-12-06 LAB — CBC
HCT: 34 % — ABNORMAL LOW (ref 39.0–52.0)
Hemoglobin: 11 g/dL — ABNORMAL LOW (ref 13.0–17.0)
MCH: 26.8 pg (ref 26.0–34.0)
MCHC: 32.4 g/dL (ref 30.0–36.0)
MCV: 82.7 fL (ref 80.0–100.0)
Platelets: 252 10*3/uL (ref 150–400)
RBC: 4.11 MIL/uL — ABNORMAL LOW (ref 4.22–5.81)
RDW: 14.7 % (ref 11.5–15.5)
WBC: 5.5 10*3/uL (ref 4.0–10.5)
nRBC: 0 % (ref 0.0–0.2)

## 2021-12-06 LAB — GLUCOSE, CAPILLARY: Glucose-Capillary: 192 mg/dL — ABNORMAL HIGH (ref 70–99)

## 2021-12-06 LAB — MAGNESIUM: Magnesium: 1.5 mg/dL — ABNORMAL LOW (ref 1.7–2.4)

## 2021-12-06 MED ORDER — POTASSIUM CHLORIDE 10 MEQ/100ML IV SOLN
10.0000 meq | INTRAVENOUS | Status: DC
  Filled 2021-12-06 (×4): qty 100

## 2021-12-06 MED ORDER — MAGNESIUM OXIDE -MG SUPPLEMENT 400 (240 MG) MG PO TABS
400.0000 mg | ORAL_TABLET | Freq: Every day | ORAL | Status: DC
Start: 2021-12-06 — End: 2021-12-06
  Administered 2021-12-06: 400 mg via ORAL
  Filled 2021-12-06: qty 1

## 2021-12-06 MED ORDER — POTASSIUM CHLORIDE CRYS ER 20 MEQ PO TBCR
40.0000 meq | EXTENDED_RELEASE_TABLET | Freq: Once | ORAL | Status: AC
  Administered 2021-12-06: 40 meq via ORAL
  Filled 2021-12-06: qty 2

## 2021-12-06 NOTE — Discharge Summary (Signed)
Physician Discharge Summary  Chase Rodriguez FGH:[id number] DOB: [birth year] DOA: 12/05/2021  PCP: [facility] [place] date: 12/05/2021 Discharge date: 12/06/2021  Admitted From: home  Disposition:  home   Recommendations for Outpatient Follow-up:  Follow up with PCP in 1 week F/u w/ nephro at [facility] in 1 week  Home Health: no  Equipment/Devices:  Discharge Condition: stable  CODE STATUS: full  Diet recommendation: Heart Healthy  Brief/Interim Summary: HPI was taken from Dr. Francine Graven: Chase Rodriguez is a 61 y.o. male with medical history significant for diabetes mellitus with stage IIIb chronic kidney disease, hypertension, dyslipidemia, nicotine dependence who was referred to the ER by his primary care provider for evaluation of hypokalemia. Patient had gone for his routine follow-up and had blood work done.  He states that he was called in the morning on the day of his admission and asked to go to the ER for evaluation of low potassium level. Patient has had diarrhea, nausea and vomiting but his symptoms have resolved.  He complains of tingling and numbness in his fingers.   He denies having any chest pain, shortness of breath, no urinary symptom, no headache, no fever, no chills, no leg swelling, no palpitations, no diaphoresis or blurred vision. Labs reveal hypokalemia and hypomagnesemia.  Patient has a baseline serum creatinine of 2.04 but today on admission it is 4.69.  He will be admitted to the hospital for further evaluation.  As per Dr. Jimmye Norman 12/06/21: Pt was found to have AKI on CKDIIIb likely secondary to GI losses from nausea, vomiting & diarrhea. Given potassium, magnesium to repleate electrolytes. Cr was trending down 4.17 on the day of d/c. Pt will f/u outpatient w/ his nephro at [facility] as well as PCP. Pt verbalized his understanding.   Discharge Diagnoses:  Principal Problem:   AKI (acute kidney injury) ([place]) Active Problems:   CKD stage 3 due to type 2  diabetes mellitus ([place])   Essential hypertension   Seizure (HCC)   Hypokalemia   Hypomagnesemia   Nicotine dependence  AKI on CKDIIIb: Cr is trending down today. Baseline Cr round 2.04. Continue on IVFs. Nephro following and recs apprec    HTN: continue on amlodipine, labetalol. Holding lisinopril secondary to AKI  Seizure: continue on home dose of keppra. Seizure precautions    Hypomagnesemia: mg oxide given as pt wanted po replacement of electrolytes    Hypokalemia: KCl repleated.    Nicotine dependence: received smoking cessation counseling. Nicotine patch to prevent w/drawal   Discharge Instructions  Discharge Instructions     Diet - low sodium heart healthy   Complete by: As directed    Diet Carb Modified   Complete by: As directed    Discharge instructions   Complete by: As directed    F/u w/ PCP in 1 week. F/u w/ nephro at [facility] in 1 week   Increase activity slowly   Complete by: As directed       Allergies as of 12/06/2021   No Known Allergies      Medication List     TAKE these medications    amLODipine 10 MG tablet Commonly known as: NORVASC Take 1 tablet (10 mg total) by mouth once daily.   atorvastatin 20 MG tablet Commonly known as: LIPITOR Take 1 tablet (20 mg total) by mouth once daily.   BD Veo Insulin Syringe U/F 31G X 15/64" 0.5 ML Misc Generic drug: Insulin Syringe-Needle U-100 USE AS DIRECTED   fenofibrate 160 MG  tablet Take 1 tablet (160 mg total) by mouth once daily.   Jardiance 25 MG Tabs tablet Generic drug: empagliflozin Take by mouth daily.   labetalol 300 MG tablet Commonly known as: NORMODYNE Take (1/2) tablet (150 mg total) by mouth 2 (two) times daily.   Levemir 100 UNIT/ML injection Generic drug: insulin detemir Inject 0.2 mLs (20 Units total) into the skin 2 (two) times daily.   levETIRAcetam 500 MG tablet Commonly known as: KEPPRA Take 1 tablet (500 mg total) by mouth 2 (two) times daily.   lisinopril 40 MG  tablet Commonly known as: ZESTRIL Take 1 tablet (40 mg total) by mouth once daily.   Rightest GL300 Lancets Misc Use as directed to test blood sugar   Rightest GM550 Blood Glucose w/Device Kit Use as directed to check blood sugar   Rightest GS550 Blood Glucose test strip Generic drug: glucose blood Use as directed to test blood sugar        No Known Allergies  Consultations: Nephro    Procedures/Studies: US RENAL  Result Date: 12/06/2021 CLINICAL DATA:  AKI. EXAM: RENAL / URINARY TRACT ULTRASOUND COMPLETE COMPARISON:  None Available. FINDINGS: Right Kidney: Renal measurements: 11.8 x 6.4 x 6.5 cm = volume: 257 mL. Echogenicity within normal limits. No mass or hydronephrosis visualized. Left Kidney: Renal measurements: 11.6 x 7.2 x 5.6 cm = volume: 245 mL. Echogenicity is within normal limits. No hydronephrosis. 2 simple appearing cyst, which measure up to 1.4 and 0.8 cm. Bladder: Appears normal for degree of bladder distention. Bilateral ureteral jets identified. IMPRESSION: 1. No hydronephrosis. 2. Two left renal cysts. Electronically Signed   By: Margaretha Sheffield M.D.   On: 12/06/2021 10:48   (Echo, Carotid, EGD, Colonoscopy, ERCP)    Subjective: Pt c/o fatigue    Discharge Exam: Vitals:   12/06/21 1000 12/06/21 1117  BP: (!) 174/99 (!) 171/86  Pulse: 72 67  Resp: 14 18  Temp: (!) 97.5 F (36.4 C) (!) 97.5 F (36.4 C)  SpO2: 100% 100%   Vitals:   12/06/21 0421 12/06/21 0422 12/06/21 1000 12/06/21 1117  BP: (!) 161/93 (!) 161/93 (!) 174/99 (!) 171/86  Pulse: 74  72 67  Resp:   14 18  Temp:   (!) 97.5 F (36.4 C) (!) 97.5 F (36.4 C)  TempSrc:   Oral   SpO2:   100% 100%  Weight:      Height:        General: Pt is alert, awake, not in acute distress Cardiovascular: S1/S2 +, no rubs, no gallops Respiratory: CTA bilaterally, no wheezing, no rhonchi Abdominal: Soft, NT, obese, bowel sounds + Extremities: no cyanosis    The results of significant  diagnostics from this hospitalization (including imaging, microbiology, ancillary and laboratory) are listed below for reference.     Microbiology: No results found for this or any previous visit (from the past 240 hour(s)).   Labs: BNP (last 3 results) No results for input(s): "BNP" in the last 8760 hours. Basic Metabolic Panel: Recent Labs  Lab 12/05/21 0953 12/05/21 0959 12/06/21 0448 12/06/21 1214  NA 140  --  141 140  K 2.6*  --  2.7* 2.9*  CL 110  --  114* 111  CO2 20*  --  20* 22  GLUCOSE 91  --  96 115*  BUN 36*  --  32* 30*  CREATININE 4.69*  --  4.37* 4.17*  CALCIUM 6.8*  --  6.6* 6.8*  MG  --  1.2* 1.5*  --  Liver Function Tests: Recent Labs  Lab 12/05/21 0953  AST 37  ALT 23  ALKPHOS 45  BILITOT 0.4  PROT 6.7  ALBUMIN 3.3*   No results for input(s): "LIPASE", "AMYLASE" in the last 168 hours. No results for input(s): "AMMONIA" in the last 168 hours. CBC: Recent Labs  Lab 12/05/21 0953 12/06/21 0448  WBC 6.3 5.5  NEUTROABS 3.7  --   HGB 11.4* 11.0*  HCT 34.9* 34.0*  MCV 82.1 82.7  PLT 245 252   Cardiac Enzymes: Recent Labs  Lab 12/05/21 1344  CKTOTAL 1,580*   BNP: Invalid input(s): "POCBNP" CBG: Recent Labs  Lab 12/05/21 1916 12/06/21 1117  GLUCAP 172* 192*   D-Dimer No results for input(s): "DDIMER" in the last 72 hours. Hgb A1c No results for input(s): "HGBA1C" in the last 72 hours. Lipid Profile No results for input(s): "CHOL", "HDL", "LDLCALC", "TRIG", "CHOLHDL", "LDLDIRECT" in the last 72 hours. Thyroid function studies No results for input(s): "TSH", "T4TOTAL", "T3FREE", "THYROIDAB" in the last 72 hours.  Invalid input(s): "FREET3" Anemia work up No results for input(s): "VITAMINB12", "FOLATE", "FERRITIN", "TIBC", "IRON", "RETICCTPCT" in the last 72 hours. Urinalysis    Component Value Date/Time   COLORURINE STRAW (A) 03/24/2021 2053   APPEARANCEUR CLEAR (A) 03/24/2021 2053   LABSPEC 1.016 03/24/2021 2053   PHURINE  6.0 03/24/2021 2053   GLUCOSEU >=500 (A) 03/24/2021 2053   HGBUR MODERATE (A) 03/24/2021 2053   BILIRUBINUR NEGATIVE 03/24/2021 2053   [place] NEGATIVE 03/24/2021 2053   PROTEINUR >=300 (A) 03/24/2021 2053   NITRITE NEGATIVE 03/24/2021 2053   LEUKOCYTESUR NEGATIVE 03/24/2021 2053   Sepsis Labs Recent Labs  Lab 12/05/21 0953 12/06/21 0448  WBC 6.3 5.5   Microbiology No results found for this or any previous visit (from the past 240 hour(s)).   Time coordinating discharge: Over 30 minutes  SIGNED:   Wyvonnia Dusky, MD  [facility] 12/06/2021, 1:46 PM Pager   If 7PM-7AM, please contact night-coverage [email or web address]

## 2021-12-06 NOTE — Progress Notes (Signed)
   12/06/21 1300  Clinical Encounter Type  Visited With Patient and family together  Visit Type Initial  Referral From Nurse  Consult/Referral To Chaplain   Chaplain responded to [facility] consult to give information on AD. Information explained and left with patient and family member present.

## 2021-12-06 NOTE — Progress Notes (Signed)
  Chaplain On-Call responded to [place] Order for Advance Directives information for the patient.  The patient was not available on two visit attempts this morning.  Chaplain will refer to the Afternoon On-Call Chaplain for follow up.  Chaplain Pollyann Samples M.Div., [facility]

## 2021-12-06 NOTE — TOC CM/SW Note (Signed)
Patient has orders to discharge home today. Chart reviewed. PCP is [facility]. On room air. No wounds. No TOC needs identified. CSW signing off.  Dayton Scrape, [place]

## 2021-12-06 NOTE — Progress Notes (Signed)
[facility]  ROUNDING NOTE   Subjective:   Patient sitting at side of bed Alert and oriented Currently eating breakfast, denies nausea and vomiting since admission Remains on room air States he feels well and is ready for discharge  Creatinine 4.37   Objective:  Vital signs in last 24 hours:  Temp:  [97.5 F (36.4 C)-98.5 F (36.9 C)] 97.5 F (36.4 C) (07/12 1117) Pulse Rate:  [64-82] 67 (07/12 1117) Resp:  [14-19] 18 (07/12 1117) BP: (149-194)/(86-124) 171/86 (07/12 1117) SpO2:  [100 %] 100 % (07/12 1117) Weight:  [101.2 kg] 101.2 kg (07/11 2316)  Weight change:  Filed Weights   12/05/21 0951 12/05/21 2316  Weight: 101.2 kg 101.2 kg    Intake/Output: I/O last 3 completed shifts: In: 1447.2 [I.V.:400; IV Piggyback:1047.2] Out: 250 [Urine:250]   Intake/Output this shift:  Total I/O In: 499.5 [I.V.:499.5] Out: 300 [Urine:300]  Physical Exam: General: NAD, sitting at side of bed  Head: Normocephalic, atraumatic. Moist oral mucosal membranes  Eyes: Anicteric  Lungs:  Clear to auscultation, normal effort, room air  Heart: Regular rate and rhythm  Abdomen:  Soft, nontender, nondistended  Extremities: No peripheral edema.  Neurologic: Nonfocal, moving all four extremities  Skin: No lesions  Access: None    Basic Metabolic Panel: Recent Labs  Lab 12/05/21 0953 12/05/21 0959 12/06/21 0448 12/06/21 1214  NA 140  --  141 140  K 2.6*  --  2.7* 2.9*  CL 110  --  114* 111  CO2 20*  --  20* 22  GLUCOSE 91  --  96 115*  BUN 36*  --  32* 30*  CREATININE 4.69*  --  4.37* 4.17*  CALCIUM 6.8*  --  6.6* 6.8*  MG  --  1.2* 1.5*  --     Liver Function Tests: Recent Labs  Lab 12/05/21 0953  AST 37  ALT 23  ALKPHOS 45  BILITOT 0.4  PROT 6.7  ALBUMIN 3.3*   No results for input(s): "LIPASE", "AMYLASE" in the last 168 hours. No results for input(s): "AMMONIA" in the last 168 hours.  CBC: Recent Labs  Lab 12/05/21 0953 12/06/21 0448  WBC  6.3 5.5  NEUTROABS 3.7  --   HGB 11.4* 11.0*  HCT 34.9* 34.0*  MCV 82.1 82.7  PLT 245 252    Cardiac Enzymes: Recent Labs  Lab 12/05/21 1344  CKTOTAL 1,580*    BNP: Invalid input(s): "POCBNP"  CBG: Recent Labs  Lab 12/05/21 1916 12/06/21 1117  GLUCAP 172* 192*    Microbiology: Results for orders placed or performed during the hospital encounter of 03/24/21  Resp Panel by RT-PCR (Flu A&B, Covid) Nasopharyngeal Swab     Status: None   Collection Time: 03/24/21  9:37 PM   Specimen: Nasopharyngeal Swab; Nasopharyngeal(NP) swabs in vial transport medium  Result Value Ref Range Status   SARS Coronavirus 2 by RT PCR NEGATIVE NEGATIVE Final    Comment: (NOTE) SARS-CoV-2 target nucleic acids are NOT DETECTED.  The SARS-CoV-2 RNA is generally detectable in upper respiratory specimens during the acute phase of infection. The lowest concentration of SARS-CoV-2 viral copies this assay can detect is 138 copies/mL. A negative result does not preclude SARS-Cov-2 infection and should not be used as the sole basis for treatment or other patient management decisions. A negative result may occur with  improper specimen collection/handling, submission of specimen other than nasopharyngeal swab, presence of viral mutation(s) within the areas targeted by this assay, and inadequate number of  viral copies(<138 copies/mL). A negative result must be combined with clinical observations, patient history, and epidemiological information. The expected result is Negative.  Fact Sheet for Patients:  EntrepreneurPulse.com.au  Fact Sheet for Healthcare Providers:  IncredibleEmployment.be  This test is no t yet approved or cleared by the [place] FDA and  has been authorized for detection and/or diagnosis of SARS-CoV-2 by FDA under an Emergency Use Authorization (EUA). This EUA will remain  in effect (meaning this test can be used) for the duration of  the COVID-19 declaration under Section 564(b)(1) of the Act, 21 U.S.C.section 360bbb-3(b)(1), unless the authorization is terminated  or revoked sooner.       Influenza A by PCR NEGATIVE NEGATIVE Final   Influenza B by PCR NEGATIVE NEGATIVE Final    Comment: (NOTE) The Xpert Xpress SARS-CoV-2/FLU/RSV plus assay is intended as an aid in the diagnosis of influenza from Nasopharyngeal swab specimens and should not be used as a sole basis for treatment. Nasal washings and aspirates are unacceptable for Xpert Xpress SARS-CoV-2/FLU/RSV testing.  Fact Sheet for Patients: EntrepreneurPulse.com.au  Fact Sheet for Healthcare Providers: IncredibleEmployment.be  This test is not yet approved or cleared by the [place] FDA and has been authorized for detection and/or diagnosis of SARS-CoV-2 by FDA under an Emergency Use Authorization (EUA). This EUA will remain in effect (meaning this test can be used) for the duration of the COVID-19 declaration under Section 564(b)(1) of the Act, 21 U.S.C. section 360bbb-3(b)(1), unless the authorization is terminated or revoked.  Performed at [facility], [address] [place], [place] [address]   MRSA Next Gen by PCR, Nasal     Status: None   Collection Time: 03/25/21 12:30 AM   Specimen: Nasal Mucosa; Nasal Swab  Result Value Ref Range Status   MRSA by PCR Next Gen NOT DETECTED NOT DETECTED Final    Comment: (NOTE) The GeneXpert MRSA Assay (FDA approved for NASAL specimens only), is one component of a comprehensive MRSA colonization surveillance program. It is not intended to diagnose MRSA infection nor to guide or monitor treatment for MRSA infections. Test performance is not FDA approved in patients less than 34 years old. Performed at [facility], [address] [place], [place] [address]     Coagulation Studies: No results for input(s): "LABPROT", "INR" in the last 72  hours.  Urinalysis: No results for input(s): "COLORURINE", "LABSPEC", "PHURINE", "GLUCOSEU", "HGBUR", "BILIRUBINUR", "KETONESUR", "PROTEINUR", "UROBILINOGEN", "NITRITE", "LEUKOCYTESUR" in the last 72 hours.  Invalid input(s): "APPERANCEUR"    Imaging: US RENAL  Result Date: 12/06/2021 CLINICAL DATA:  AKI. EXAM: RENAL / URINARY TRACT ULTRASOUND COMPLETE COMPARISON:  None Available. FINDINGS: Right Kidney: Renal measurements: 11.8 x 6.4 x 6.5 cm = volume: 257 mL. Echogenicity within normal limits. No mass or hydronephrosis visualized. Left Kidney: Renal measurements: 11.6 x 7.2 x 5.6 cm = volume: 245 mL. Echogenicity is within normal limits. No hydronephrosis. 2 simple appearing cyst, which measure up to 1.4 and 0.8 cm. Bladder: Appears normal for degree of bladder distention. Bilateral ureteral jets identified. IMPRESSION: 1. No hydronephrosis. 2. Two left renal cysts. Electronically Signed   By: Margaretha Sheffield M.D.   On: 12/06/2021 10:48     Medications:    sodium chloride 100 mL/hr at 12/06/21 1000    amLODipine  10 mg Oral Daily   atorvastatin  20 mg Oral Daily   heparin  5,000 Units Subcutaneous Q8H   labetalol  150 mg Oral BID   levETIRAcetam  500  mg Oral BID   magnesium oxide  400 mg Oral Daily   nicotine  21 mg Transdermal Daily   potassium chloride  40 mEq Oral Once   acetaminophen **OR** acetaminophen, ondansetron **OR** ondansetron (ZOFRAN) IV  Assessment/ Plan:  Chase Rodriguez is a 61 y.o.  male with past medical history of diabetes, hypertension, nicotine dependence, dyslipidemia, and chronic kidney disease stage IIIb, who was admitted to [facility] on 12/05/2021 for Hypokalemia [E87.6] Hypomagnesemia [E83.42] AKI (acute kidney injury) ([place]) [N17.9]   Acute kidney injury with hypokalemia/Hypomagnesia on chronic kidney disease stage IIIb.  Baseline creatinine appears to be 2.04 with GFR 37 on 03/30/2021.  Patient currently followed by [facility] physicians.  Acute kidney injury  likely secondary to hypovolemia from recent nausea, vomiting, and diarrhea.  Chronic kidney disease likely secondary to diabetes and hypertension.  No IV contrast exposure.  Potassium on admission 2.6.     Potassium 2.7 this morning with recheck 2.9. Oral supplementation ordered by primary team. Creatinine improved to 4.17. Patient is cleared to discharge with recommended follow up with [facility]  Lab Results  Component Value Date   CREATININE 4.17 (H) 12/06/2021   CREATININE 4.37 (H) 12/06/2021   CREATININE 4.69 (H) 12/05/2021    Intake/Output Summary (Last 24 hours) at 12/06/2021 1348 Last data filed at 12/06/2021 1045 Gross per 24 hour  Intake 899.53 ml  Output 550 ml  Net 349.53 ml   2. Anemia of chronic kidney disease Lab Results  Component Value Date   HGB 11.0 (L) 12/06/2021    Hgb at goal  3. Secondary Hyperparathyroidism: Lab Results  Component Value Date   CALCIUM 6.8 (L) 12/06/2021   PHOS 4.9 (H) 03/30/2021    Calcium remains decreased but improving with IVF and oral nutrition.   4.  Hypertension with chronic kidney disease.  Home regimen includes amlodipine, lisinopril, and labetalol.  Lisinopril currently held in setting of kidney injury.   5. Diabetes mellitus type II with chronic kidney disease: insulin dependent. Home regimen includes Levemir. Most recent hemoglobin A1c is 12.5 on 03/25/21.   Discussed with patient the importance of diet management to maintain current kidney function.    LOS: 1 Chase Rodriguez 7/12/20231:48 PM

## 2021-12-06 NOTE — Plan of Care (Signed)
  Problem: Clinical Measurements: Goal: Ability to maintain clinical measurements within normal limits will improve Outcome: Progressing   Problem: Clinical Measurements: Goal: Will remain free from infection Outcome: Progressing   

## 2021-12-12 ENCOUNTER — Other Ambulatory Visit: Payer: Self-pay

## 2021-12-13 ENCOUNTER — Other Ambulatory Visit: Payer: Self-pay

## 2021-12-20 ENCOUNTER — Other Ambulatory Visit: Payer: Self-pay

## 2021-12-27 ENCOUNTER — Emergency Department
Admission: EM | Admit: 2021-12-27 | Discharge: 2021-12-27 | Disposition: A | Discharge status: Home / Self Care | Payer: 59 | Attending: Emergency Medicine | Admitting: Emergency Medicine

## 2021-12-27 ENCOUNTER — Other Ambulatory Visit: Payer: Self-pay

## 2021-12-27 DIAGNOSIS — N189 Chronic kidney disease, unspecified: Secondary | ICD-10-CM | POA: Diagnosis not present

## 2021-12-27 DIAGNOSIS — N184 Chronic kidney disease, stage 4 (severe): Secondary | ICD-10-CM | POA: Diagnosis not present

## 2021-12-27 DIAGNOSIS — I129 Hypertensive chronic kidney disease with stage 1 through stage 4 chronic kidney disease, or unspecified chronic kidney disease: Secondary | ICD-10-CM | POA: Diagnosis not present

## 2021-12-27 DIAGNOSIS — R7989 Other specified abnormal findings of blood chemistry: Secondary | ICD-10-CM | POA: Diagnosis not present

## 2021-12-27 LAB — CBC WITH DIFFERENTIAL/PLATELET
Abs Immature Granulocytes: 0.03 10*3/uL (ref 0.00–0.07)
Basophils Absolute: 0 10*3/uL (ref 0.0–0.1)
Basophils Relative: 0 %
Eosinophils Absolute: 0.1 10*3/uL (ref 0.0–0.5)
Eosinophils Relative: 1 %
HCT: 33.9 % — ABNORMAL LOW (ref 39.0–52.0)
Hemoglobin: 11.2 g/dL — ABNORMAL LOW (ref 13.0–17.0)
Immature Granulocytes: 0 %
Lymphocytes Relative: 27 %
Lymphs Abs: 2.1 10*3/uL (ref 0.7–4.0)
MCH: 27.7 pg (ref 26.0–34.0)
MCHC: 33 g/dL (ref 30.0–36.0)
MCV: 83.7 fL (ref 80.0–100.0)
Monocytes Absolute: 0.7 10*3/uL (ref 0.1–1.0)
Monocytes Relative: 9 %
Neutro Abs: 4.7 10*3/uL (ref 1.7–7.7)
Neutrophils Relative %: 63 %
Platelets: 258 10*3/uL (ref 150–400)
RBC: 4.05 MIL/uL — ABNORMAL LOW (ref 4.22–5.81)
RDW: 14.6 % (ref 11.5–15.5)
WBC: 7.6 10*3/uL (ref 4.0–10.5)
nRBC: 0 % (ref 0.0–0.2)

## 2021-12-27 LAB — COMPREHENSIVE METABOLIC PANEL
ALT: 20 U/L (ref 0–44)
AST: 22 U/L (ref 15–41)
Albumin: 3.2 g/dL — ABNORMAL LOW (ref 3.5–5.0)
Alkaline Phosphatase: 56 U/L (ref 38–126)
Anion gap: 8 (ref 5–15)
BUN: 43 mg/dL — ABNORMAL HIGH (ref 6–20)
CO2: 20 mmol/L — ABNORMAL LOW (ref 22–32)
Calcium: 6.8 mg/dL — ABNORMAL LOW (ref 8.9–10.3)
Chloride: 114 mmol/L — ABNORMAL HIGH (ref 98–111)
Creatinine, Ser: 4.52 mg/dL — ABNORMAL HIGH (ref 0.61–1.24)
GFR, Estimated: 14 mL/min — ABNORMAL LOW (ref >60)
Glucose, Bld: 151 mg/dL — ABNORMAL HIGH (ref 70–99)
Potassium: 3.2 mmol/L — ABNORMAL LOW (ref 3.5–5.1)
Sodium: 142 mmol/L (ref 135–145)
Total Bilirubin: 0.5 mg/dL (ref 0.3–1.2)
Total Protein: 6.9 g/dL (ref 6.5–8.1)

## 2021-12-27 LAB — URINALYSIS, ROUTINE W REFLEX MICROSCOPIC
Bilirubin Urine: NEGATIVE
Glucose, UA: 150 mg/dL — AB
Ketones, ur: NEGATIVE mg/dL
Leukocytes,Ua: NEGATIVE
Nitrite: NEGATIVE
Protein, ur: >=300 mg/dL — AB
Specific Gravity, Urine: 1.014 (ref 1.005–1.030)
pH: 5 (ref 5.0–8.0)

## 2021-12-27 MED ORDER — LABETALOL HCL 5 MG/ML IV SOLN
10.0000 mg | Freq: Once | INTRAVENOUS | Status: AC
  Administered 2021-12-27: 10 mg via INTRAVENOUS

## 2021-12-27 MED ORDER — LABETALOL HCL 5 MG/ML IV SOLN
15.0000 mg | Freq: Once | INTRAVENOUS | Status: AC
  Administered 2021-12-27: 15 mg via INTRAVENOUS
  Filled 2021-12-27: qty 4

## 2021-12-27 MED ORDER — SODIUM CHLORIDE 0.9 % IV BOLUS
1000.0000 mL | Freq: Once | INTRAVENOUS | Status: AC
  Administered 2021-12-27: 1000 mL via INTRAVENOUS

## 2021-12-27 MED ORDER — LABETALOL HCL 5 MG/ML IV SOLN: 10.0000 mg | Freq: Once | INTRAVENOUS | Status: DC

## 2021-12-27 NOTE — ED Notes (Addendum)
First Nurse Note:  Pt via POV from home. Pt PCP called and told patient has increasingly gotten worse. Denies any complaints pt only here because doctor told him to.

## 2021-12-27 NOTE — Discharge Instructions (Signed)
FOR YOUR BLOOD PRESSURE:  STOP TAKING THE LISINOPRIL   CONTINUE THE AMLODIPINE AND LABETALOL  FOR THE LABETALOL, YOU ARE CURRENTLY PRESCRIBED 150 MG (HALF A TABLET); INCREASE THIS TO 1 FULL TABLET TWICE A DAY  FOLLOW-UP WITH DR. LATEEF THIS WEEK OR NET FOR REPEAT LABS  DRINK PLENTY OF FLUIDS

## 2021-12-27 NOTE — ED Provider Triage Note (Signed)
Emergency Medicine Provider Triage Evaluation Note  Chase Rodriguez , a 61 y.o. male  was evaluated in triage.  Pt complains of Abnormal LFTs.  Has a history of CKD, diabetes, hypertension, is currently followed [name].  He does report he has been evaluated by a nephrologist at [name] that has been more than 2 years ago.  He has been lost to follow-up from that specialist.  He presents to the ED today after he was notified by [name] that he had an acute abnormality to his LFTs.  Patient denies any complaints of chest pain, fever, shortness of breath, or back pain.  Review of Systems  Positive: Abnormal LFTs Negative: FCS  Physical Exam  BP (!) 162/101 (BP Location: Left Arm)   Pulse 82   Temp 98.3 F (36.8 C) (Oral)   Resp 16   Ht 5\' 11"  (1.803 m)   Wt 101.2 kg   SpO2 100%   BMI 31.10 kg/m  Gen:   Awake, no distress  NAD Resp:  Normal effort CTA MSK:   Moves extremities without difficulty  ABD:  Soft, nontender  Medical Decision Making  Medically screening exam initiated at 3:50 PM.  Appropriate orders placed.  GIORDANO GETMAN was informed that the remainder of the evaluation will be completed by another provider, this initial triage assessment does not replace that evaluation, and the importance of remaining in the ED until their evaluation is complete.  Patient to the ED for evaluation of abnormal LFTs.  Patient with a history of CKD, HTN, DM, presents after report of abnormal labs and his provider at [facility] clinic.   [name], PA-C 12/27/21 1555

## 2021-12-27 NOTE — ED Provider Notes (Signed)
[facility] Provider Note    Event Date/Time   First MD Initiated Contact with Patient 12/27/21 1855     (approximate)   History   Abnormal Lab   HPI  Chase Rodriguez is a 61 y.o. male here with abnormal lab.  The patient was just hospitalized for acute on chronic kidney injury.  He had an outpatient labs sent today and was told to come to the emergency department for evaluation.  He states he otherwise has been well.  Has been drinking normally.  He has had no other complaints.  No swelling in his legs.  No shortness of breath.  Denies any bladder changes in his urine or urine output.     Physical Exam   Triage Vital Signs: ED Triage Vitals  Enc Vitals Group     BP 12/27/21 1548 (!) 162/101     Pulse Rate 12/27/21 1548 82     Resp 12/27/21 1548 16     Temp 12/27/21 1548 98.3 F (36.8 C)     Temp Source 12/27/21 1548 Oral     SpO2 12/27/21 1548 100 %     Weight 12/27/21 1549 223 lb (101.2 kg)     Height 12/27/21 1549 5\' 11"  (1.803 m)     Head Circumference --      Peak Flow --      Pain Score 12/27/21 1549 0     Pain Loc --      Pain Edu? --      Excl. in [place]? --     Most recent vital signs: Vitals:   12/27/21 2126 12/27/21 2200  BP: (!) 183/97 (!) 172/88  Pulse: 73 66  Resp: 18 18  Temp:  98.2 F (36.8 C)  SpO2: 100% 100%     General: Awake, no distress.  CV:  Good peripheral perfusion.  Resp:  Normal effort.  Lungs clear to auscultation bilaterally.  No rales. Abd:  No distention.  Other:  No LE edema.    ED Results / Procedures / Treatments   Labs (all labs ordered are listed, but only abnormal results are displayed) Labs Reviewed  CBC WITH DIFFERENTIAL/PLATELET - Abnormal; Notable for the following components:      Result Value   RBC 4.05 (*)    Hemoglobin 11.2 (*)    HCT 33.9 (*)    All other components within normal limits  COMPREHENSIVE METABOLIC PANEL - Abnormal; Notable for the following components:   Potassium  3.2 (*)    Chloride 114 (*)    CO2 20 (*)    Glucose, Bld 151 (*)    BUN 43 (*)    Creatinine, Ser 4.52 (*)    Calcium 6.8 (*)    Albumin 3.2 (*)    GFR, Estimated 14 (*)    All other components within normal limits  URINALYSIS, ROUTINE W REFLEX MICROSCOPIC - Abnormal; Notable for the following components:   Color, Urine YELLOW (*)    APPearance HAZY (*)    Glucose, UA 150 (*)    Hgb urine dipstick SMALL (*)    Protein, ur >=300 (*)    Bacteria, UA FEW (*)    All other components within normal limits     EKG    RADIOLOGY    I also independently reviewed and agree with radiologist interpretations.   PROCEDURES:  Critical Care performed: No   MEDICATIONS ORDERED IN ED: Medications  sodium chloride 0.9 % bolus 1,000 mL (0 mLs  Intravenous Stopped 12/27/21 2133)  labetalol (NORMODYNE) injection 10 mg (10 mg Intravenous Given 12/27/21 2014)  labetalol (NORMODYNE) injection 15 mg (15 mg Intravenous Given 12/27/21 2133)     IMPRESSION / MDM / ASSESSMENT AND PLAN / ED COURSE  I reviewed the triage vital signs and the nursing notes.                               The patient is on the cardiac monitor to evaluate for evidence of arrhythmia and/or significant heart rate changes.   Ddx:  Differential includes the following, with pertinent life- or limb-threatening emergencies considered:  CKD, acute on chronic kidney injury, ATN, lab error  Patient's presentation is most consistent with acute presentation with potential threat to life or bodily function.  MDM:  61 yo M with h/o CKD here with abnormal lab. Sent in from PCP. Cr 4.52 here, which is not far off where he was at time of d/c from recent hospitalizations. Labs otherwise overall at baseline. CO2 at baseline. CBC unremarkable. UA unremarkable- minimal pyuria noted but pt has no urinary sx. Doubt UTI.   Called and discussed case with Dr. Holley Raring of Nephrology. Given that pt is very near his baseline, does not feel pt  needs admission at this time. Likely represents progression of underlying CKD. Of note, pt was unsure if he was still on lisinopril. He is hypertensive here. Will have him hold/dc his lisinopril, and increase his labetalol. F/u with Dr. Holley Raring as outpatient.   MEDICATIONS GIVEN IN ED: Medications  sodium chloride 0.9 % bolus 1,000 mL (0 mLs Intravenous Stopped 12/27/21 2133)  labetalol (NORMODYNE) injection 10 mg (10 mg Intravenous Given 12/27/21 2014)  labetalol (NORMODYNE) injection 15 mg (15 mg Intravenous Given 12/27/21 2133)     Consults:     EMR reviewed  Reviewed prior d/c records from hospitalization, prior labs/Cr 4.1-4.3     FINAL CLINICAL IMPRESSION(S) / ED DIAGNOSES   Final diagnoses:  Stage 4 chronic kidney disease ([place])     Rx / DC Orders   ED Discharge Orders     None        Note:  This document was prepared using Dragon voice recognition software and may include unintentional dictation errors.   Duffy Bruce, MD 12/28/21 [phone]

## 2021-12-27 NOTE — ED Triage Notes (Addendum)
Pt reports he recently had routine blood work at PCP office.  Pt received a call stating his kidney function is abnormal and he needed to come to the ED.  Pt denies medical complaints.  Upon chart review, Pt has a history of chronic kidney disease and has not followed-up w/ specialist.

## 2022-01-09 ENCOUNTER — Other Ambulatory Visit: Payer: Self-pay

## 2022-01-16 ENCOUNTER — Other Ambulatory Visit: Payer: Self-pay

## 2022-02-23 ENCOUNTER — Telehealth (INDEPENDENT_AMBULATORY_CARE_PROVIDER_SITE_OTHER): Payer: Self-pay

## 2022-02-23 NOTE — Telephone Encounter (Signed)
Ebony with [facility] called and [facility] questioning if I had received a referral for pt. I returned call and LVM advising Ebony that I have not received the referral. I did not leave name of patient and DOB as I am not sure if it is a confidential VM. Just advised that the referral she left a message about does not have a referral in. Left fax # of [phone] for her to fax if needed. Nothing further needed at this time.

## 2022-05-01 ENCOUNTER — Other Ambulatory Visit (INDEPENDENT_AMBULATORY_CARE_PROVIDER_SITE_OTHER): Payer: Self-pay | Admitting: Vascular Surgery

## 2022-05-01 ENCOUNTER — Ambulatory Visit (INDEPENDENT_AMBULATORY_CARE_PROVIDER_SITE_OTHER): Payer: Medicaid Other | Admitting: Nurse Practitioner

## 2022-05-01 ENCOUNTER — Encounter (INDEPENDENT_AMBULATORY_CARE_PROVIDER_SITE_OTHER): Payer: Self-pay | Admitting: Nurse Practitioner

## 2022-05-01 ENCOUNTER — Ambulatory Visit (INDEPENDENT_AMBULATORY_CARE_PROVIDER_SITE_OTHER): Payer: 59

## 2022-05-01 VITALS — BP 155/93 | HR 76 | Resp 16 | Wt 225.0 lb

## 2022-05-01 DIAGNOSIS — N186 End stage renal disease: Secondary | ICD-10-CM

## 2022-05-01 DIAGNOSIS — N185 Chronic kidney disease, stage 5: Secondary | ICD-10-CM

## 2022-05-01 DIAGNOSIS — I10 Essential (primary) hypertension: Secondary | ICD-10-CM | POA: Diagnosis not present

## 2022-05-01 DIAGNOSIS — E7800 Pure hypercholesterolemia, unspecified: Secondary | ICD-10-CM | POA: Diagnosis not present

## 2022-05-10 DIAGNOSIS — N184 Chronic kidney disease, stage 4 (severe): Secondary | ICD-10-CM | POA: Diagnosis not present

## 2022-05-10 DIAGNOSIS — D631 Anemia in chronic kidney disease: Secondary | ICD-10-CM | POA: Diagnosis not present

## 2022-05-10 DIAGNOSIS — N189 Chronic kidney disease, unspecified: Secondary | ICD-10-CM | POA: Diagnosis not present

## 2022-05-10 DIAGNOSIS — N185 Chronic kidney disease, stage 5: Secondary | ICD-10-CM | POA: Diagnosis not present

## 2022-05-13 ENCOUNTER — Encounter (INDEPENDENT_AMBULATORY_CARE_PROVIDER_SITE_OTHER): Payer: Self-pay | Admitting: Nurse Practitioner

## 2022-05-13 NOTE — Progress Notes (Signed)
Subjective:    Patient ID: Chase Rodriguez, male    DOB: [birth year], 62 y.o.   MRN: [id number] Chief Complaint  Patient presents with   New Patient (Initial Visit)    Ref Percell Miller consult vein mapp for new access    The patient is seen for evaluation for dialysis access. The patient has chronic renal insufficiency stage V secondary to hypertension. The patient's most recent creatinine clearance is less than 20. The patient volume status has not yet become an issue. Patient's blood pressures been relatively well controlled. There are mild uremic symptoms which appear to be relatively well tolerated at this time.  The patient notes the kidney problem has been present for a long time and has been progressively getting worse.  The patient is followed by nephrology.    The patient is right-handed.  The patient has been considering the various methods of dialysis and wishes to proceed with hemodialysis and therefore creation of AV access.  No recent shortening of the patient's walking distance or new symptoms consistent with claudication.  No history of rest pain symptoms. No new ulcers or wounds of the lower extremities have occurred.  The patient denies amaurosis fugax or recent TIA symptoms. There are no recent neurological changes noted. There is no history of DVT, PE or superficial thrombophlebitis. No recent episodes of angina or shortness of breath documented.   The patient has adequate access for a left brachiocephalic AV fistula    Review of Systems  All other systems reviewed and are negative.      Objective:   Physical Exam Vitals reviewed.  HENT:     Head: Normocephalic.  Cardiovascular:     Rate and Rhythm: Normal rate.     Pulses:          Radial pulses are 2+ on the right side and 2+ on the left side.  Pulmonary:     Effort: Pulmonary effort is normal.  Skin:    General: Skin is warm and dry.  Neurological:     Mental Status: He is alert and oriented to  person, place, and time.  Psychiatric:        Mood and Affect: Mood normal.        Behavior: Behavior normal.        Thought Content: Thought content normal.        Judgment: Judgment normal.     BP (!) 155/93 (BP Location: Right Arm)   Pulse 76   Resp 16   Wt 225 lb (102.1 kg)   BMI 31.38 kg/m   Past Medical History:  Diagnosis Date   Diabetes mellitus without complication (HCC)    Hepatitis    High cholesterol    Hypertension    Stage 3b chronic kidney disease (CKD) (HCC)     Social History   Socioeconomic History   Marital status: Single    Spouse name: Not on file   Number of children: Not on file   Years of education: Not on file   Highest education level: Not on file  Occupational History   Not on file  Tobacco Use   Smoking status: Every Day    Packs/day: 0.50    Years: 39.00    Total pack years: 19.50    Types: Cigarettes   Smokeless tobacco: Never  Substance and Sexual Activity   Alcohol use: Yes    Comment: sometimes 1-2 drinks   Drug use: No   Sexual activity: Not on file  Other Topics Concern   Not on file  Social History Narrative   Not on file   Social Determinants of Health   Financial Resource Strain: Not on file  Food Insecurity: Not on file  Transportation Needs: Not on file  Physical Activity: Not on file  Stress: Not on file  Social Connections: Not on file  Intimate Partner Violence: Not on file    Past Surgical History:  Procedure Laterality Date   TONSILLECTOMY      Family History  Problem Relation Age of Onset   Diabetes Mother    Vascular Disease Father     No Known Allergies     Latest Ref Rng & Units 12/27/2021    3:58 PM 12/06/2021    4:48 AM 12/05/2021    9:53 AM  CBC  WBC 4.0 - 10.5 K/uL 7.6  5.5  6.3   Hemoglobin 13.0 - 17.0 g/dL 11.2  11.0  11.4   Hematocrit 39.0 - 52.0 % 33.9  34.0  34.9   Platelets 150 - 400 K/uL 258  252  245       CMP     Component Value Date/Time   NA 142 12/27/2021 1558   K  3.2 (L) 12/27/2021 1558   CL 114 (H) 12/27/2021 1558   CO2 20 (L) 12/27/2021 1558   GLUCOSE 151 (H) 12/27/2021 1558   BUN 43 (H) 12/27/2021 1558   CREATININE 4.52 (H) 12/27/2021 1558   CALCIUM 6.8 (L) 12/27/2021 1558   PROT 6.9 12/27/2021 1558   ALBUMIN 3.2 (L) 12/27/2021 1558   AST 22 12/27/2021 1558   ALT 20 12/27/2021 1558   ALKPHOS 56 12/27/2021 1558   BILITOT 0.5 12/27/2021 1558   GFRNONAA 14 (L) 12/27/2021 1558     No results found.     Assessment & Plan:   1. Chronic kidney disease, stage V ([place]) Recommend:  At this time the patient does not have appropriate extremity access for dialysis  Patient should have a left brachiocephalic AV fistula created.  The risks, benefits and alternative therapies were reviewed in detail with the patient.  All questions were answered.  The patient agrees to proceed with surgery.   The patient will follow up with me in the office after the surgery.  2. Pure hypercholesterolemia Continue statin as ordered and reviewed, no changes at this time  3. Essential hypertension Continue antihypertensive medications as already ordered, these medications have been reviewed and there are no changes at this time.   Current Outpatient Medications on File Prior to Visit  Medication Sig Dispense Refill   amLODipine (NORVASC) 10 MG tablet Take 1 tablet (10 mg total) by mouth once daily. 30 tablet 3   Blood Glucose Monitoring Suppl (RIGHTEST GM550 BLOOD GLUCOSE) w/Device KIT Use as directed to check blood sugar 1 kit 0   empagliflozin (JARDIANCE) 25 MG TABS tablet Take by mouth daily.     insulin detemir (LEVEMIR) 100 UNIT/ML injection Inject 0.2 mLs (20 Units total) into the skin 2 (two) times daily. 10 mL 11   Insulin Syringe-Needle U-100 (BD VEO INSULIN SYRINGE U/F) 31G X 15/64" 0.5 ML MISC USE AS DIRECTED 60 each PRN   levETIRAcetam (KEPPRA) 500 MG tablet Take 1 tablet (500 mg total) by mouth 2 (two) times daily. 60 tablet 5   Rightest GL300  Lancets MISC Use as directed to test blood sugar 100 each 11   atorvastatin (LIPITOR) 20 MG tablet Take 1 tablet (20 mg total) by mouth once  daily. (Patient not taking: Reported on 12/05/2021) 30 tablet 10   fenofibrate 160 MG tablet Take 1 tablet (160 mg total) by mouth once daily. (Patient not taking: Reported on 12/05/2021) 30 tablet 2   glucose blood (RIGHTEST GS550 BLOOD GLUCOSE) test strip Use as directed to test blood sugar (Patient not taking: Reported on 05/01/2022) 100 each 11   labetalol (NORMODYNE) 300 MG tablet Take (1/2) tablet (150 mg total) by mouth 2 (two) times daily. (Patient not taking: Reported on 12/05/2021) 60 tablet 3   [DISCONTINUED] carvedilol (COREG) 6.25 MG tablet Take 1 tablet (6.25 mg total) by mouth 2 (two) times daily. 60 tablet 0   [DISCONTINUED] potassium chloride SA (KLOR-CON M) 20 MEQ tablet Take 1 tablet (20 mEq total) by mouth daily. 30 tablet 1   No current facility-administered medications on file prior to visit.    There are no Patient Instructions on file for this visit. No follow-ups on file.   Kris Hartmann, NP

## 2022-05-16 ENCOUNTER — Telehealth (INDEPENDENT_AMBULATORY_CARE_PROVIDER_SITE_OTHER): Payer: Self-pay

## 2022-05-16 DIAGNOSIS — E1122 Type 2 diabetes mellitus with diabetic chronic kidney disease: Secondary | ICD-10-CM | POA: Diagnosis not present

## 2022-05-16 DIAGNOSIS — E1121 Type 2 diabetes mellitus with diabetic nephropathy: Secondary | ICD-10-CM | POA: Diagnosis not present

## 2022-05-16 DIAGNOSIS — N185 Chronic kidney disease, stage 5: Secondary | ICD-10-CM | POA: Diagnosis not present

## 2022-05-16 NOTE — Telephone Encounter (Signed)
I attempted to contact the patient to schedule a for a left brachial cephalic av fistula with Dr. Lucky Cowboy. A message was left for a return call.

## 2022-06-06 ENCOUNTER — Telehealth (INDEPENDENT_AMBULATORY_CARE_PROVIDER_SITE_OTHER): Payer: Self-pay

## 2022-06-06 NOTE — Telephone Encounter (Signed)
Spoke with the patient and he is scheduled with Dr. Lucky Cowboy for a left brachial cephalic AV fistula surgery at the [facility] on 06/14/22. Pre-op phone call is on 06/08/22 between 8-1 pm. Pre-surgical instructions were discussed and will be mailed.

## 2022-06-07 ENCOUNTER — Other Ambulatory Visit (INDEPENDENT_AMBULATORY_CARE_PROVIDER_SITE_OTHER): Payer: Self-pay | Admitting: Nurse Practitioner

## 2022-06-07 DIAGNOSIS — N186 End stage renal disease: Secondary | ICD-10-CM

## 2022-06-08 ENCOUNTER — Inpatient Hospital Stay
Admission: RE | Admit: 2022-06-08 | Discharge: 2022-06-08 | Disposition: A | Discharge status: Home / Self Care | Payer: 59 | Source: Ambulatory Visit

## 2022-06-08 NOTE — Pre-Procedure Instructions (Signed)
Multiple attempts made to reach patient today to do his PAT interview and go over his pre op instructions, 2 messages left and I spoke with patients brother who will try and reach patient to have him call us.

## 2022-06-08 NOTE — Patient Instructions (Signed)
Your procedure is scheduled on: 06/14/22 - Thursday Report to the Registration Desk on the 1st floor of the [facility]. To find out your arrival time, please call [phone] between 1PM - 3PM on: 06/13/22 - Wednesday If your arrival time is 6:00 am, do not arrive prior to that time as the [place] entrance doors do not open until 6:00 am.  REMEMBER: Instructions that are not followed completely may result in serious medical risk, up to and including death; or upon the discretion of your surgeon and anesthesiologist your surgery may need to be rescheduled.  Do not eat food or drink any liquids after midnight the night before surgery.  No gum chewing, lozengers or hard candies.   TAKE THESE MEDICATIONS THE MORNING OF SURGERY WITH A SIP OF WATER: - amLODipine (NORVASC)  - levETIRAcetam (KEPPRA)   HOLD empagliflozin (JARDIANCE) beginning 06/11/22.  insulin detemir (LEVEMIR) - Inject 1/2 night time dose on the night before your surgery, and inject NONE on the morning of your surgery.  One week prior to surgery: Stop Anti-inflammatories (NSAIDS) such as Advil, Aleve, Ibuprofen, Motrin, Naproxen, Naprosyn and Aspirin based products such as Excedrin, Goodys Powder, BC Powder. Stop ANY OVER THE COUNTER supplements until after surgery. You may however, continue to take Tylenol if needed for pain up until the day of surgery.  No Alcohol for 24 hours before or after surgery.  No Smoking including e-cigarettes for 24 hours prior to surgery.  No chewable tobacco products for at least 6 hours prior to surgery.  No nicotine patches on the day of surgery.  Do not use any "recreational" drugs for at least a week prior to your surgery.  Please be advised that the combination of cocaine and anesthesia may have negative outcomes, up to and including death. If you test positive for cocaine, your surgery will be cancelled.  On the morning of surgery brush your teeth with toothpaste and water,  you may rinse your mouth with mouthwash if you wish. Do not swallow any toothpaste or mouthwash.  Use CHG Soap or wipes as directed on instruction sheet.  Do not wear jewelry, make-up, hairpins, clips or nail polish.  Do not wear lotions, powders, or perfumes.   Do not shave body from the neck down 48 hours prior to surgery just in case you cut yourself which could leave a site for infection.  Also, freshly shaved skin may become irritated if using the CHG soap.  Contact lenses, hearing aids and dentures may not be worn into surgery.  Do not bring valuables to the hospital. [facility] is not responsible for any missing/lost belongings or valuables.   Notify your doctor if there is any change in your medical condition (cold, fever, infection).  Wear comfortable clothing (specific to your surgery type) to the hospital.  After surgery, you can help prevent lung complications by doing breathing exercises.  Take deep breaths and cough every 1-2 hours. Your doctor may order a device called an Incentive Spirometer to help you take deep breaths. When coughing or sneezing, hold a pillow firmly against your incision with both hands. This is called "splinting." Doing this helps protect your incision. It also decreases belly discomfort.  If you are being admitted to the hospital overnight, leave your suitcase in the car. After surgery it may be brought to your room.  If you are being discharged the day of surgery, you will not be allowed to drive home. You will need a responsible adult (18  years or older) to drive you home and stay with you that night.   If you are taking public transportation, you will need to have a responsible adult (18 years or older) with you. Please confirm with your physician that it is acceptable to use public transportation.   Please call the [place] Dept. at [phone] if you have any questions about these instructions.  Surgery Visitation  Policy:  Patients undergoing a surgery or procedure may have two family members or support persons with them as long as the person is not COVID-19 positive or experiencing its symptoms.   Inpatient Visitation:    Visiting hours are 7 a.m. to 8 p.m. Up to four visitors are allowed at one time in a patient room. The visitors may rotate out with other people during the day. One designated support person (adult) may remain overnight.  Due to an increase in RSV and influenza rates and associated hospitalizations, children ages 49 and under will not be able to visit patients in [facility]. Masks continue to be strongly recommended.

## 2022-06-12 ENCOUNTER — Encounter
Admission: RE | Admit: 2022-06-12 | Discharge: 2022-06-12 | Disposition: A | Discharge status: Home / Self Care | Payer: 59 | Source: Ambulatory Visit | Attending: Vascular Surgery | Admitting: Vascular Surgery

## 2022-06-12 ENCOUNTER — Other Ambulatory Visit: Payer: Self-pay

## 2022-06-12 DIAGNOSIS — Z01812 Encounter for preprocedural laboratory examination: Secondary | ICD-10-CM

## 2022-06-12 DIAGNOSIS — I6381 Other cerebral infarction due to occlusion or stenosis of small artery: Secondary | ICD-10-CM

## 2022-06-12 HISTORY — DX: Personal history of urinary calculi: Z87.442

## 2022-06-12 NOTE — Patient Instructions (Addendum)
Your procedure is scheduled on: 06/14/22 - Thursday Report to the Registration Desk on the 1st floor of the [facility]. To find out your arrival time, please call [phone] between 1PM - 3PM on: 06/13/22 - Wednesday If your arrival time is 6:00 am, do not arrive prior to that time as the [facility] entrance doors do not open until 6:00 am.  REMEMBER: Instructions that are not followed completely may result in serious medical risk, up to and including death; or upon the discretion of your surgeon and anesthesiologist your surgery may need to be rescheduled.  Do not eat food or drink any liquids after midnight the night before surgery.  No gum chewing, lozengers or hard candies.   TAKE THESE MEDICATIONS THE MORNING OF SURGERY WITH A SIP OF WATER:  -amLODipine (NORVASC)  -levETIRAcetam (KEPPRA)   - sodium bicarbonate    HOLD empagliflozin (JARDIANCE) 3 days prior to your surgery  insulin detemir (LEVEMIR) - Inject 1/2 of your dose on the night before your surgery and NONE on the morning of surgery.  One week prior to surgery: Stop Anti-inflammatories (NSAIDS) such as Advil, Aleve, Ibuprofen, Motrin, Naproxen, Naprosyn and Aspirin based products such as Excedrin, Goodys Powder, BC Powder.  Stop ANY OVER THE COUNTER supplements until after surgery.  You may however, continue to take Tylenol if needed for pain up until the day of surgery.  No Alcohol for 24 hours before or after surgery.  No Smoking including e-cigarettes for 24 hours prior to surgery.  No chewable tobacco products for at least 6 hours prior to surgery.  No nicotine patches on the day of surgery.  Do not use any "recreational" drugs for at least a week prior to your surgery.  Please be advised that the combination of cocaine and anesthesia may have negative outcomes, up to and including death. If you test positive for cocaine, your surgery will be cancelled.  On the morning of surgery brush your teeth with  toothpaste and water, you may rinse your mouth with mouthwash if you wish. Do not swallow any toothpaste or mouthwash.  Use CHG Soap or wipes as directed on instruction sheet.  Do not wear jewelry, make-up, hairpins, clips or nail polish.  Do not wear lotions, powders, or perfumes.   Do not shave body from the neck down 48 hours prior to surgery just in case you cut yourself which could leave a site for infection. Also, freshly shaved skin may become irritated if using the CHG soap.  Contact lenses, hearing aids and dentures may not be worn into surgery.  Do not bring valuables to the hospital. [facility] is not responsible for any missing/lost belongings or valuables.   Notify your doctor if there is any change in your medical condition (cold, fever, infection).  Wear comfortable clothing (specific to your surgery type) to the hospital.  After surgery, you can help prevent lung complications by doing breathing exercises.  Take deep breaths and cough every 1-2 hours. Your doctor may order a device called an Incentive Spirometer to help you take deep breaths. When coughing or sneezing, hold a pillow firmly against your incision with both hands. This is called "splinting." Doing this helps protect your incision. It also decreases belly discomfort.  If you are being admitted to the hospital overnight, leave your suitcase in the car. After surgery it may be brought to your room.  If you are being discharged the day of surgery, you will not be allowed to drive  home. You will need a responsible adult (18 years or older) to drive you home and stay with you that night.   If you are taking public transportation, you will need to have a responsible adult (18 years or older) with you. Please confirm with your physician that it is acceptable to use public transportation.   Please call the [place] Dept. at [phone] if you have any questions about these instructions.  Surgery  Visitation Policy:  Patients undergoing a surgery or procedure may have two family members or support persons with them as long as the person is not COVID-19 positive or experiencing its symptoms.   Inpatient Visitation:    Visiting hours are 7 a.m. to 8 p.m. Up to four visitors are allowed at one time in a patient room. The visitors may rotate out with other people during the day. One designated support person (adult) may remain overnight.  Due to an increase in RSV and influenza rates and associated hospitalizations, children ages 25 and under will not be able to visit patients in [facility]. Masks continue to be strongly recommended.

## 2022-06-12 NOTE — Pre-Procedure Instructions (Signed)
Multiple attempts made to reach patient to do his pre op interview and review his pre op orders, left voice mail for patient and spoke with patients brother and ask him to have patient contact us.

## 2022-06-13 MED ORDER — SODIUM CHLORIDE 0.9 % IV SOLN: INTRAVENOUS | Status: DC

## 2022-06-13 MED ORDER — CHLORHEXIDINE GLUCONATE 0.12 % MT SOLN: 15.0000 mL | Freq: Once | OROMUCOSAL | Status: AC

## 2022-06-13 MED ORDER — ORAL CARE MOUTH RINSE: 15.0000 mL | Freq: Once | OROMUCOSAL | Status: AC

## 2022-06-13 MED ORDER — FAMOTIDINE 20 MG PO TABS: 20.0000 mg | ORAL_TABLET | Freq: Once | ORAL | Status: AC

## 2022-06-13 MED ORDER — CHLORHEXIDINE GLUCONATE CLOTH 2 % EX PADS: 6.0000 | MEDICATED_PAD | Freq: Once | CUTANEOUS | Status: DC

## 2022-06-13 MED ORDER — CEFAZOLIN SODIUM-DEXTROSE 2-4 GM/100ML-% IV SOLN
2.0000 g | INTRAVENOUS | Status: AC
  Administered 2022-06-14: 2 g via INTRAVENOUS

## 2022-06-14 ENCOUNTER — Ambulatory Visit: Payer: 59

## 2022-06-14 ENCOUNTER — Encounter
Admission: RE | Disposition: A | Discharge status: Home / Self Care | Payer: Self-pay | Source: Home / Self Care | Attending: Vascular Surgery

## 2022-06-14 ENCOUNTER — Encounter: Payer: Self-pay | Admitting: Vascular Surgery

## 2022-06-14 ENCOUNTER — Other Ambulatory Visit: Payer: Self-pay

## 2022-06-14 ENCOUNTER — Ambulatory Visit
Admission: RE | Admit: 2022-06-14 | Discharge: 2022-06-14 | Disposition: A | Discharge status: Home / Self Care | Payer: 59 | Attending: Vascular Surgery | Admitting: Vascular Surgery

## 2022-06-14 ENCOUNTER — Ambulatory Visit: Payer: 59 | Admitting: Anesthesiology

## 2022-06-14 ENCOUNTER — Ambulatory Visit: Payer: 59 | Admitting: Urgent Care

## 2022-06-14 DIAGNOSIS — K759 Inflammatory liver disease, unspecified: Secondary | ICD-10-CM | POA: Insufficient documentation

## 2022-06-14 DIAGNOSIS — F17210 Nicotine dependence, cigarettes, uncomplicated: Secondary | ICD-10-CM | POA: Diagnosis not present

## 2022-06-14 DIAGNOSIS — E1122 Type 2 diabetes mellitus with diabetic chronic kidney disease: Secondary | ICD-10-CM | POA: Insufficient documentation

## 2022-06-14 DIAGNOSIS — Z01812 Encounter for preprocedural laboratory examination: Secondary | ICD-10-CM

## 2022-06-14 DIAGNOSIS — N185 Chronic kidney disease, stage 5: Secondary | ICD-10-CM | POA: Diagnosis not present

## 2022-06-14 DIAGNOSIS — N186 End stage renal disease: Secondary | ICD-10-CM

## 2022-06-14 DIAGNOSIS — I6381 Other cerebral infarction due to occlusion or stenosis of small artery: Secondary | ICD-10-CM

## 2022-06-14 DIAGNOSIS — R69 Illness, unspecified: Secondary | ICD-10-CM | POA: Diagnosis not present

## 2022-06-14 DIAGNOSIS — E7800 Pure hypercholesterolemia, unspecified: Secondary | ICD-10-CM | POA: Diagnosis not present

## 2022-06-14 DIAGNOSIS — I120 Hypertensive chronic kidney disease with stage 5 chronic kidney disease or end stage renal disease: Secondary | ICD-10-CM | POA: Diagnosis not present

## 2022-06-14 DIAGNOSIS — G8918 Other acute postprocedural pain: Secondary | ICD-10-CM | POA: Diagnosis not present

## 2022-06-14 HISTORY — PX: AV FISTULA PLACEMENT: SHX1204

## 2022-06-14 LAB — BASIC METABOLIC PANEL
Anion gap: 12 (ref 5–15)
BUN: 50 mg/dL — ABNORMAL HIGH (ref 6–20)
CO2: 20 mmol/L — ABNORMAL LOW (ref 22–32)
Calcium: 7.4 mg/dL — ABNORMAL LOW (ref 8.9–10.3)
Chloride: 103 mmol/L (ref 98–111)
Creatinine, Ser: 6.63 mg/dL — ABNORMAL HIGH (ref 0.61–1.24)
GFR, Estimated: 9 mL/min — ABNORMAL LOW (ref >60)
Glucose, Bld: 124 mg/dL — ABNORMAL HIGH (ref 70–99)
Potassium: 3.5 mmol/L (ref 3.5–5.1)
Sodium: 135 mmol/L (ref 135–145)

## 2022-06-14 LAB — CBC WITH DIFFERENTIAL/PLATELET
Abs Immature Granulocytes: 0.05 10*3/uL (ref 0.00–0.07)
Basophils Absolute: 0 10*3/uL (ref 0.0–0.1)
Basophils Relative: 0 %
Eosinophils Absolute: 0.2 10*3/uL (ref 0.0–0.5)
Eosinophils Relative: 3 %
HCT: 30.9 % — ABNORMAL LOW (ref 39.0–52.0)
Hemoglobin: 10 g/dL — ABNORMAL LOW (ref 13.0–17.0)
Immature Granulocytes: 1 %
Lymphocytes Relative: 31 %
Lymphs Abs: 2.2 10*3/uL (ref 0.7–4.0)
MCH: 28.4 pg (ref 26.0–34.0)
MCHC: 32.4 g/dL (ref 30.0–36.0)
MCV: 87.8 fL (ref 80.0–100.0)
Monocytes Absolute: 0.7 10*3/uL (ref 0.1–1.0)
Monocytes Relative: 9 %
Neutro Abs: 4.1 10*3/uL (ref 1.7–7.7)
Neutrophils Relative %: 56 %
Platelets: 246 10*3/uL (ref 150–400)
RBC: 3.52 MIL/uL — ABNORMAL LOW (ref 4.22–5.81)
RDW: 13.2 % (ref 11.5–15.5)
WBC: 7.3 10*3/uL (ref 4.0–10.5)
nRBC: 0 % (ref 0.0–0.2)

## 2022-06-14 LAB — TYPE AND SCREEN
ABO/RH(D): O POS
Antibody Screen: NEGATIVE

## 2022-06-14 LAB — ABO/RH: ABO/RH(D): O POS

## 2022-06-14 LAB — GLUCOSE, CAPILLARY: Glucose-Capillary: 93 mg/dL (ref 70–99)

## 2022-06-14 SURGERY — ARTERIOVENOUS (AV) FISTULA CREATION
Anesthesia: General | Site: Arm Lower | Laterality: Left

## 2022-06-14 MED ORDER — HEPARIN SODIUM (PORCINE) 5000 UNIT/ML IJ SOLN
INTRAMUSCULAR | Status: AC
  Filled 2022-06-14: qty 1

## 2022-06-14 MED ORDER — HYDROCODONE-ACETAMINOPHEN 5-325 MG PO TABS: 2.0000 | ORAL_TABLET | Freq: Four times a day (QID) | ORAL | 0 refills | Status: AC | PRN

## 2022-06-14 MED ORDER — FENTANYL CITRATE PF 50 MCG/ML IJ SOSY: 50.0000 ug | PREFILLED_SYRINGE | Freq: Once | INTRAMUSCULAR | Status: AC

## 2022-06-14 MED ORDER — BUPIVACAINE-EPINEPHRINE (PF) 0.5% -1:200000 IJ SOLN
INTRAMUSCULAR | Status: AC
  Filled 2022-06-14: qty 30

## 2022-06-14 MED ORDER — OXYCODONE HCL 5 MG/5ML PO SOLN: 5.0000 mg | Freq: Once | ORAL | Status: DC | PRN

## 2022-06-14 MED ORDER — OXYCODONE HCL 5 MG PO TABS: 5.0000 mg | ORAL_TABLET | Freq: Once | ORAL | Status: DC | PRN

## 2022-06-14 MED ORDER — HYDROMORPHONE HCL 1 MG/ML IJ SOLN: 1.0000 mg | Freq: Once | INTRAMUSCULAR | Status: DC | PRN

## 2022-06-14 MED ORDER — LIDOCAINE HCL (PF) 1 % IJ SOLN
INTRAMUSCULAR | Status: AC
  Filled 2022-06-14: qty 5

## 2022-06-14 MED ORDER — FAMOTIDINE 20 MG PO TABS
ORAL_TABLET | ORAL | Status: AC
  Administered 2022-06-14: 20 mg via ORAL
  Filled 2022-06-14: qty 1

## 2022-06-14 MED ORDER — PROPOFOL 1000 MG/100ML IV EMUL
INTRAVENOUS | Status: AC
  Filled 2022-06-14: qty 100

## 2022-06-14 MED ORDER — DEXMEDETOMIDINE HCL IN NACL 200 MCG/50ML IV SOLN
INTRAVENOUS | Status: DC | PRN
  Administered 2022-06-14: 4 ug via INTRAVENOUS

## 2022-06-14 MED ORDER — CHLORHEXIDINE GLUCONATE 0.12 % MT SOLN
OROMUCOSAL | Status: AC
  Administered 2022-06-14: 15 mL via OROMUCOSAL
  Filled 2022-06-14: qty 15

## 2022-06-14 MED ORDER — PROPOFOL 500 MG/50ML IV EMUL
INTRAVENOUS | Status: DC | PRN
  Administered 2022-06-14: 120 ug/kg/min via INTRAVENOUS

## 2022-06-14 MED ORDER — MIDAZOLAM HCL 2 MG/2ML IJ SOLN: 1.0000 mg | Freq: Once | INTRAMUSCULAR | Status: AC

## 2022-06-14 MED ORDER — FENTANYL CITRATE PF 50 MCG/ML IJ SOSY
PREFILLED_SYRINGE | INTRAMUSCULAR | Status: AC
  Administered 2022-06-14: 50 ug via INTRAVENOUS
  Filled 2022-06-14: qty 1

## 2022-06-14 MED ORDER — ONDANSETRON HCL 4 MG/2ML IJ SOLN: 4.0000 mg | Freq: Four times a day (QID) | INTRAMUSCULAR | Status: DC | PRN

## 2022-06-14 MED ORDER — FENTANYL CITRATE (PF) 100 MCG/2ML IJ SOLN: 25.0000 ug | INTRAMUSCULAR | Status: DC | PRN

## 2022-06-14 MED ORDER — SODIUM CHLORIDE 0.9 % IV SOLN
INTRAVENOUS | Status: DC | PRN
  Administered 2022-06-14: 500 mL

## 2022-06-14 MED ORDER — DEXMEDETOMIDINE HCL IN NACL 80 MCG/20ML IV SOLN
INTRAVENOUS | Status: AC
  Filled 2022-06-14: qty 20

## 2022-06-14 MED ORDER — LIDOCAINE HCL (PF) 2 % IJ SOLN
INTRAMUSCULAR | Status: AC
  Filled 2022-06-14: qty 5

## 2022-06-14 MED ORDER — MIDAZOLAM HCL 2 MG/2ML IJ SOLN
INTRAMUSCULAR | Status: AC
  Administered 2022-06-14: 1 mg via INTRAVENOUS
  Filled 2022-06-14: qty 2

## 2022-06-14 MED ORDER — ASPIRIN 81 MG PO TBEC: 81.0000 mg | DELAYED_RELEASE_TABLET | Freq: Every day | ORAL | 2 refills | Status: AC

## 2022-06-14 MED ORDER — CEFAZOLIN SODIUM-DEXTROSE 2-4 GM/100ML-% IV SOLN
INTRAVENOUS | Status: AC
  Filled 2022-06-14: qty 100

## 2022-06-14 MED ORDER — BUPIVACAINE HCL (PF) 0.5 % IJ SOLN
INTRAMUSCULAR | Status: DC | PRN
  Administered 2022-06-14 (×2): 10 mL via PERINEURAL

## 2022-06-14 MED ORDER — BUPIVACAINE HCL (PF) 0.5 % IJ SOLN
INTRAMUSCULAR | Status: AC
  Filled 2022-06-14: qty 20

## 2022-06-14 MED ORDER — PAPAVERINE HCL 30 MG/ML IJ SOLN
INTRAMUSCULAR | Status: AC
  Filled 2022-06-14: qty 2

## 2022-06-14 MED ORDER — HEPARIN SODIUM (PORCINE) 1000 UNIT/ML IJ SOLN
INTRAMUSCULAR | Status: DC | PRN
  Administered 2022-06-14: 3000 [IU] via INTRAVENOUS

## 2022-06-14 SURGICAL SUPPLY — 53 items
ADH SKN CLS APL DERMABOND .7 (GAUZE/BANDAGES/DRESSINGS) ×1
APL PRP STRL LF DISP 70% ISPRP (MISCELLANEOUS) ×1
BAG DECANTER FOR FLEXI CONT (MISCELLANEOUS) ×1 IMPLANT
BLADE SURG SZ11 CARB STEEL (BLADE) ×1 IMPLANT
BOOT SUTURE AID YELLOW STND (SUTURE) ×1 IMPLANT
BRUSH SCRUB EZ  4% CHG (MISCELLANEOUS) ×1
BRUSH SCRUB EZ 4% CHG (MISCELLANEOUS) ×1 IMPLANT
CHLORAPREP W/TINT 26 (MISCELLANEOUS) ×1 IMPLANT
CLIP SPRNG 6 S-JAW DBL (CLIP) ×1 IMPLANT
CLIP SPRNG 6MM S-JAW DBL (CLIP) ×1
DERMABOND ADVANCED .7 DNX12 (GAUZE/BANDAGES/DRESSINGS) ×1 IMPLANT
ELECT CAUTERY BLADE 6.4 (BLADE) ×1 IMPLANT
ELECT REM PT RETURN 9FT ADLT (ELECTROSURGICAL) ×1
ELECTRODE REM PT RTRN 9FT ADLT (ELECTROSURGICAL) ×1 IMPLANT
GEL ULTRASOUND 20GR AQUASONIC (MISCELLANEOUS) IMPLANT
GLOVE BIO SURGEON STRL SZ7 (GLOVE) ×2 IMPLANT
GOWN STRL REUS W/ TWL LRG LVL3 (GOWN DISPOSABLE) ×2 IMPLANT
GOWN STRL REUS W/ TWL XL LVL3 (GOWN DISPOSABLE) ×2 IMPLANT
GOWN STRL REUS W/TWL LRG LVL3 (GOWN DISPOSABLE) ×2
GOWN STRL REUS W/TWL XL LVL3 (GOWN DISPOSABLE) ×2
HEMOSTAT SURGICEL 2X3 (HEMOSTASIS) ×1 IMPLANT
IV NS 500ML (IV SOLUTION) ×1
IV NS 500ML BAXH (IV SOLUTION) ×1 IMPLANT
KIT TURNOVER KIT A (KITS) ×1 IMPLANT
LABEL OR SOLS (LABEL) ×1 IMPLANT
LOOP RED MAXI  1X406MM (MISCELLANEOUS)
LOOP VESSEL MAXI 1X406 RED (MISCELLANEOUS) ×1 IMPLANT
LOOP VESSEL MINI 0.8X406 BLUE (MISCELLANEOUS) ×1 IMPLANT
LOOPS BLUE MINI 0.8X406MM (MISCELLANEOUS)
MANIFOLD NEPTUNE II (INSTRUMENTS) ×1 IMPLANT
NDL FILTER BLUNT 18X1 1/2 (NEEDLE) ×1 IMPLANT
NEEDLE FILTER BLUNT 18X1 1/2 (NEEDLE) ×1 IMPLANT
NS IRRIG 500ML POUR BTL (IV SOLUTION) ×1 IMPLANT
PACK EXTREMITY ARMC (MISCELLANEOUS) ×1 IMPLANT
PAD PREP 24X41 OB/GYN DISP (PERSONAL CARE ITEMS) ×1 IMPLANT
SOLUTION CELL SAVER (CLIP) ×1 IMPLANT
STOCKINETTE 48X4 2 PLY STRL (GAUZE/BANDAGES/DRESSINGS) ×1 IMPLANT
STOCKINETTE STRL 4IN 9604848 (GAUZE/BANDAGES/DRESSINGS) ×1 IMPLANT
SUT MNCRL AB 4-0 PS2 18 (SUTURE) ×1 IMPLANT
SUT PROLENE 6 0 BV (SUTURE) ×4 IMPLANT
SUT SILK 2 0 (SUTURE) ×1
SUT SILK 2-0 18XBRD TIE 12 (SUTURE) ×1 IMPLANT
SUT SILK 3 0 (SUTURE) ×1
SUT SILK 3-0 18XBRD TIE 12 (SUTURE) ×1 IMPLANT
SUT SILK 4 0 (SUTURE) ×1
SUT SILK 4-0 18XBRD TIE 12 (SUTURE) ×1 IMPLANT
SUT VIC AB 3-0 SH 27 (SUTURE) ×1
SUT VIC AB 3-0 SH 27X BRD (SUTURE) ×1 IMPLANT
SYR 20ML LL LF (SYRINGE) ×1 IMPLANT
SYR 3ML LL SCALE MARK (SYRINGE) ×1 IMPLANT
SYR TB 1ML 27GX1/2 LL (SYRINGE) IMPLANT
TRAP FLUID SMOKE EVACUATOR (MISCELLANEOUS) ×1 IMPLANT
WATER STERILE IRR 500ML POUR (IV SOLUTION) ×1 IMPLANT

## 2022-06-14 NOTE — Anesthesia Preprocedure Evaluation (Addendum)
Anesthesia Evaluation  Patient identified by MRN, date of birth, ID band Patient awake    Reviewed: Allergy & Precautions, NPO status , Patient's Chart, lab work & pertinent test results  History of Anesthesia Complications Negative for: history of anesthetic complications  Airway Mallampati: III  TM Distance: >3 FB Neck ROM: full    Dental  (+) Chipped, Poor Dentition, Missing, Loose   Pulmonary neg shortness of breath, Current Smoker and Patient abstained from smoking.   Pulmonary exam normal        Cardiovascular Exercise Tolerance: Good hypertension, (-) angina (-) Past MI Normal cardiovascular exam     Neuro/Psych Seizures -,   negative psych ROS   GI/Hepatic negative GI ROS,,,(+) Hepatitis -  Endo/Other  diabetes, Type 2    Renal/GU ESRFRenal disease  negative genitourinary   Musculoskeletal   Abdominal   Peds  Hematology negative hematology ROS (+)   Anesthesia Other Findings Past Medical History: No date: Diabetes mellitus without complication (HCC) No date: Hepatitis No date: High cholesterol No date: History of kidney stones No date: Hypertension No date: Stage 3b chronic kidney disease (CKD) (HCC)  Past Surgical History: No date: TONSILLECTOMY     Reproductive/Obstetrics negative OB ROS                             Anesthesia Physical Anesthesia Plan  ASA: 3  Anesthesia Plan: General   Post-op Pain Management: Regional block*   Induction: Intravenous  PONV Risk Score and Plan: Propofol infusion and TIVA  Airway Management Planned: Natural Airway and Nasal Cannula  Additional Equipment:   Intra-op Plan:   Post-operative Plan:   Informed Consent: I have reviewed the patients History and Physical, chart, labs and discussed the procedure including the risks, benefits and alternatives for the proposed anesthesia with the patient or authorized representative who  has indicated his/her understanding and acceptance.     Dental Advisory Given  Plan Discussed with: Anesthesiologist, CRNA and Surgeon  Anesthesia Plan Comments: (Patient consented for risks of anesthesia including but not limited to:  - adverse reactions to medications - risk of airway placement if required - damage to eyes, teeth, lips or other oral mucosa - nerve damage due to positioning  - sore throat or hoarseness - Damage to heart, brain, nerves, lungs, other parts of body or loss of life  Patient voiced understanding.)       Anesthesia Quick Evaluation

## 2022-06-14 NOTE — Anesthesia Procedure Notes (Signed)
Anesthesia Regional Block: Supraclavicular block   Pre-Anesthetic Checklist: , timeout performed,  Correct Patient, Correct Site, Correct Laterality,  Correct Procedure, Correct Position, site marked,  Risks and benefits discussed,  Surgical consent,  Pre-op evaluation,  At surgeon's request and post-op pain management  Laterality: Upper and Left  Prep: chloraprep       Needles:  Injection technique: Single-shot  Needle Type: Stimiplex     Needle Length: 9cm  Needle Gauge: 22     Additional Needles:   Procedures:,,,, ultrasound used (permanent image in chart),,    Narrative:  Start time: 06/14/2022 12:55 PM End time: 06/14/2022 12:58 PM Injection made incrementally with aspirations every 5 mL.  Performed by: Personally  Anesthesiologist: Emmerie Battaglia, Precious Haws, MD  Additional Notes: Patient consented for risk and benefits of nerve block including but not limited to nerve damage, Horner's syndrome, failed block, bleeding and infection.  Patient voiced understanding.  Functioning IV was confirmed and monitors were applied.  Timeout done prior to procedure and prior to any sedation being given to the patient.  Patient confirmed procedure site prior to any sedation given to the patient.  A 62mm 22ga Stimuplex needle was used. Sterile prep,hand hygiene and sterile gloves were used.  Minimal sedation used for procedure.  No paresthesia endorsed by patient during the procedure.  Negative aspiration and negative test dose prior to incremental administration of local anesthetic. The patient tolerated the procedure well with no immediate complications.

## 2022-06-14 NOTE — H&P (Signed)
[facility] Admission History & Physical  MRN : [id number]  Chase Rodriguez is a 62 y.o. (06/20/1960) male who presents with chief complaint of No chief complaint on file. Marland Kitchen  History of Present Illness: Patient presents today for placement of his left arm AV fistula.  Has chronic kidney disease nearing dialysis dependence.  No new complaints today.  Current Facility-Administered Medications  Medication Dose Route Frequency Provider Last Rate Last Admin   0.9 %  sodium chloride infusion   Intravenous Continuous Iran Ouch, MD       bupivacaine(PF) (MARCAINE) 0.5 % injection            ceFAZolin (ANCEF) 2-4 GM/100ML-% IVPB            ceFAZolin (ANCEF) IVPB 2g/100 mL premix  2 g Intravenous On Call to OR Kris Hartmann, NP       Chlorhexidine Gluconate Cloth 2 % PADS 6 each  6 each Topical Once Kris Hartmann, NP       And   Chlorhexidine Gluconate Cloth 2 % PADS 6 each  6 each Topical Once Kris Hartmann, NP       fentaNYL (SUBLIMAZE) 50 MCG/ML injection            lidocaine (PF) (XYLOCAINE) 1 % injection            midazolam (VERSED) 2 MG/2ML injection             Past Medical History:  Diagnosis Date   Diabetes mellitus without complication ([place] [place])    Hepatitis    High cholesterol    History of kidney stones    Hypertension    Stage 3b chronic kidney disease (CKD) ([place])     Past Surgical History:  Procedure Laterality Date   TONSILLECTOMY       Social History   Tobacco Use   Smoking status: Every Day    Packs/day: 0.50    Years: 39.00    Total pack years: 19.50    Types: Cigarettes   Smokeless tobacco: Never  Substance Use Topics   Alcohol use: Yes    Comment: sometimes 1-2 drinks   Drug use: No     Family History  Problem Relation Age of Onset   Diabetes Mother    Vascular Disease Father   No bleeding or clotting disorders  No Known Allergies   REVIEW OF SYSTEMS (Negative unless checked)  Constitutional:  [] Weight loss  [] Fever  [] Chills Cardiac: [] Chest pain   [] Chest pressure   [] Palpitations   [] Shortness of breath when laying flat   [] Shortness of breath at rest   [x] Shortness of breath with exertion. Vascular:  [] Pain in legs with walking   [] Pain in legs at rest   [] Pain in legs when laying flat   [] Claudication   [] Pain in feet when walking  [] Pain in feet at rest  [] Pain in feet when laying flat   [] History of DVT   [] Phlebitis   [x] Swelling in legs   [] Varicose veins   [] Non-healing ulcers Pulmonary:   [] Uses home oxygen   [] Productive cough   [] Hemoptysis   [] Wheeze  [] COPD   [] Asthma Neurologic:  [] Dizziness  [] Blackouts   [] Seizures   [] History of stroke   [] History of TIA  [] Aphasia   [] Temporary blindness   [] Dysphagia   [] Weakness or numbness in arms   [] Weakness or numbness in legs Musculoskeletal:  [] Arthritis   [] Joint swelling   [] Joint pain   []   Low back pain Hematologic:  [] Easy bruising  [] Easy bleeding   [] Hypercoagulable state   [x] Anemic  [] Hepatitis Gastrointestinal:  [] Blood in stool   [] Vomiting blood  [] Gastroesophageal reflux/heartburn   [] Difficulty swallowing. Genitourinary:  [x] Chronic kidney disease   [] Difficult urination  [] Frequent urination  [] Burning with urination   [] Blood in urine Skin:  [] Rashes   [] Ulcers   [] Wounds Psychological:  [] History of anxiety   []  History of major depression.  Physical Examination  Vitals:   06/14/22 1230  BP: (!) 165/92  Resp: 20  Temp: 98 F (36.7 C)  TempSrc: Oral  SpO2: 100%  Weight: 105.2 kg  Height: 5\' 11"  (1.803 m)   Body mass index is 32.36 kg/m. Gen: WD/WN, NAD Head: [place]/AT, No temporalis wasting.  Ear/Nose/Throat: Hearing grossly intact, nares w/o erythema or drainage, oropharynx w/o Erythema/Exudate,  Eyes: Conjunctiva clear, sclera non-icteric Neck: Trachea midline.  No JVD.  Pulmonary:  Good air movement, respirations not labored, no use of accessory muscles.  Cardiac: RRR, normal S1, S2. Vascular:   Vessel Right Left  Radial Palpable Palpable               Musculoskeletal: M/S 5/5 throughout.  Extremities without ischemic changes.  No deformity or atrophy.  Neurologic: Sensation grossly intact in extremities.  Symmetrical.  Speech is fluent. Motor exam as listed above. Psychiatric: Judgment intact, Mood & affect appropriate for pt's clinical situation. Dermatologic: No rashes or ulcers noted.  No cellulitis or open wounds.      CBC Lab Results  Component Value Date   WBC 7.6 12/27/2021   HGB 11.2 (L) 12/27/2021   HCT 33.9 (L) 12/27/2021   MCV 83.7 12/27/2021   PLT 258 12/27/2021    BMET    Component Value Date/Time   NA 142 12/27/2021 1558   K 3.2 (L) 12/27/2021 1558   CL 114 (H) 12/27/2021 1558   CO2 20 (L) 12/27/2021 1558   GLUCOSE 151 (H) 12/27/2021 1558   BUN 43 (H) 12/27/2021 1558   CREATININE 4.52 (H) 12/27/2021 1558   CALCIUM 6.8 (L) 12/27/2021 1558   GFRNONAA 14 (L) 12/27/2021 1558   CrCl cannot be calculated (Patient's most recent lab result is older than the maximum 21 days allowed.).  COAG Lab Results  Component Value Date   INR 1.0 03/24/2021    Radiology No results found.   Assessment/Plan 1. CKD stage 5 nearing dialysis dependence.  To place a left arm AV fistula today.  Risks and benefits are discussed. 2.  Hypertension.  Likely an underlying cause of his renal failure and  lipid control important in reducing the progression of atherosclerotic disease. Continue statin therapy 3.  Hyperlipidemia. lipid control important in reducing the progression of atherosclerotic disease. Continue statin therapy 4.  Diabetes. Likely an underlying cause of his renal failure and blood glucose control important in reducing the progression of atherosclerotic disease. Also, involved in wound healing. On appropriate medications.     Leotis Pain, MD  06/14/2022 12:39 PM

## 2022-06-14 NOTE — Transfer of Care (Signed)
Immediate Anesthesia Transfer of Care Note  Patient: Chase Rodriguez  Procedure(s) Performed: ARTERIOVENOUS (AV) FISTULA CREATION (BRACHIAL CEPHALIC) (Left: Arm Lower)  Patient Location: PACU  Anesthesia Type:General  Level of Consciousness: drowsy and patient cooperative  Airway & Oxygen Therapy: Patient Spontanous Breathing  Post-op Assessment: Report given to RN and Post -op Vital signs reviewed and stable  Post vital signs: Reviewed and stable  Last Vitals:  Vitals Value Taken Time  BP 118/85 06/14/22 1430  Temp    Pulse 72 06/14/22 1430  Resp 13 06/14/22 1430  SpO2 100 % 06/14/22 1430  Vitals shown include unvalidated device data.  Last Pain:  Vitals:   06/14/22 1230  TempSrc: Oral  PainSc: 0-No pain         Complications: No notable events documented.

## 2022-06-14 NOTE — Op Note (Signed)
[facility]   OPERATIVE NOTE   PROCEDURE: Left brachiocephalic arteriovenous fistula placement  PRE-OPERATIVE DIAGNOSIS: 1.  CKD stage V        POST-OPERATIVE DIAGNOSIS: 1. CKD stage V  SURGEON: Leotis Pain, MD  ASSISTANT(S): none  ANESTHESIA: general  ESTIMATED BLOOD LOSS: 10 cc  FINDING(S): Adequate cephalic vein for fistula creation  SPECIMEN(S):  none  INDICATIONS:   Chase Rodriguez is a 62 y.o. male who presents with renal failure in need of pemanent dialysis acces.  The patient is scheduled for left arm arm AVF placement.  The patient is aware the risks include but are not limited to: bleeding, infection, steal syndrome, nerve damage, ischemic monomelic neuropathy, failure to mature, and need for additional procedures.  The patient is aware of the risks of the procedure and elects to proceed forward.  DESCRIPTION: After full informed written consent was obtained from the patient, the patient was brought back to the operating room and placed supine upon the operating table.  Prior to induction, the patient received IV antibiotics.   After obtaining adequate anesthesia, the patient was then prepped and draped in the standard fashion for a left arm access procedure.  I made a curvilinear incision at the level of the antecubital fossa and dissected through the subcutaneous tissue and fascia to gain exposure of the brachial artery.  This was noted to be patent and adequate in size for fistula creation.  This was dissected out proximally and distally and prepared for control with vessel loops .  I then dissected out the cephalic vein.  This was noted to be patent and adequate in size for fistula creation.  I then gave the patient 3000 units of intravenous heparin.  The vein was marked for orientation and the distal segment of the vein was ligated with a  2-0 silk, and the vein was transected.  I then instilled the heparinized saline into the vein and clamped it.   At this point, I reset my exposure of the brachial artery and pulled up control on the vessel loops.  I made an arteriotomy with a #11 blade, and then I extended the arteriotomy with a Potts scissor.  I injected heparinized saline proximal and distal to this arteriotomy.  The vein was then sewn to the artery in an end-to-side configuration with a running stitch of 6-0 Prolene.  Prior to completing this anastomosis, I allowed the vein and artery to backbleed.  There was no evidence of clot from any vessels.  I completed the anastomosis in the usual fashion and then released all vessel loops and clamps.  There was a palpable  thrill in the venous outflow, and there was a palpable pulse in the artery distal to the anastomosis.  At this point, I irrigated out the surgical wound.  Surgicel was placed. There was no further active bleeding.  The subcutaneous tissue was reapproximated with a running stitch of 3-0 Vicryl.  The skin was then closed with a 4-0 Monocryl suture.  The skin was then cleaned, dried, and reinforced with Dermabond.  The patient tolerated this procedure well and was taken to the recovery room in stable condition  COMPLICATIONS: None  CONDITION: Stable   Leotis Pain    06/14/2022, 2:51 PM  This note was created with Dragon Medical transcription system. Any errors in dictation are purely unintentional.

## 2022-06-14 NOTE — Progress Notes (Signed)
Sling provided to pt for left arm due to block and decreased feeling/movement.  Care explained to pt and girlfriend.

## 2022-06-14 NOTE — Progress Notes (Signed)
Postop note: pt states the person with him to take him home is his girlfriend but she can't drive.  Denies having any friends/family that can drive to take him home.  Spoke with nursing supervisor for taxi voucher for pt's transportation to home.

## 2022-06-15 ENCOUNTER — Encounter: Payer: Self-pay | Admitting: Vascular Surgery

## 2022-06-18 NOTE — Anesthesia Postprocedure Evaluation (Signed)
Anesthesia Post Note  Patient: Chase Rodriguez  Procedure(s) Performed: ARTERIOVENOUS (AV) FISTULA CREATION (BRACHIAL CEPHALIC) (Left: Arm Lower)  Patient location during evaluation: PACU Anesthesia Type: General and Regional Level of consciousness: awake Pain management: pain level controlled Respiratory status: spontaneous breathing Cardiovascular status: stable Anesthetic complications: no  No notable events documented.   Last Vitals:  Vitals:   06/14/22 1505 06/14/22 1521  BP:  (!) 157/82  Pulse: 67   Resp: 18 16  Temp:  (!) 36.2 C  SpO2: 100%     Last Pain:  Vitals:   06/15/22 0834  TempSrc:   PainSc: 0-No pain                 VAN STAVEREN,Phylicia Mcgaugh

## 2022-07-05 DIAGNOSIS — N185 Chronic kidney disease, stage 5: Secondary | ICD-10-CM | POA: Diagnosis not present

## 2022-07-05 DIAGNOSIS — D631 Anemia in chronic kidney disease: Secondary | ICD-10-CM | POA: Diagnosis not present

## 2022-07-12 DIAGNOSIS — N185 Chronic kidney disease, stage 5: Secondary | ICD-10-CM | POA: Diagnosis not present

## 2022-07-12 DIAGNOSIS — D649 Anemia, unspecified: Secondary | ICD-10-CM | POA: Diagnosis not present

## 2022-07-12 DIAGNOSIS — E611 Iron deficiency: Secondary | ICD-10-CM | POA: Diagnosis not present

## 2022-07-12 DIAGNOSIS — I10 Essential (primary) hypertension: Secondary | ICD-10-CM | POA: Diagnosis not present

## 2022-07-23 ENCOUNTER — Other Ambulatory Visit (INDEPENDENT_AMBULATORY_CARE_PROVIDER_SITE_OTHER): Payer: Self-pay | Admitting: Vascular Surgery

## 2022-07-23 DIAGNOSIS — N186 End stage renal disease: Secondary | ICD-10-CM

## 2022-07-23 DIAGNOSIS — Z9889 Other specified postprocedural states: Secondary | ICD-10-CM

## 2022-07-26 ENCOUNTER — Encounter (INDEPENDENT_AMBULATORY_CARE_PROVIDER_SITE_OTHER): Payer: Medicaid Other

## 2022-07-26 ENCOUNTER — Ambulatory Visit (INDEPENDENT_AMBULATORY_CARE_PROVIDER_SITE_OTHER): Payer: Medicaid Other | Admitting: Nurse Practitioner

## 2022-10-09 ENCOUNTER — Encounter (INDEPENDENT_AMBULATORY_CARE_PROVIDER_SITE_OTHER): Payer: Medicaid Other

## 2022-10-09 ENCOUNTER — Ambulatory Visit (INDEPENDENT_AMBULATORY_CARE_PROVIDER_SITE_OTHER): Payer: Medicaid Other | Admitting: Nurse Practitioner

## 2023-05-23 ENCOUNTER — Other Ambulatory Visit: Payer: Self-pay

## 2023-05-23 ENCOUNTER — Emergency Department
Admission: EM | Admit: 2023-05-23 | Discharge: 2023-05-23 | Disposition: A | Discharge status: Home / Self Care | Payer: 59 | Attending: Emergency Medicine | Admitting: Emergency Medicine

## 2023-05-23 DIAGNOSIS — N186 End stage renal disease: Secondary | ICD-10-CM | POA: Insufficient documentation

## 2023-05-23 DIAGNOSIS — D649 Anemia, unspecified: Secondary | ICD-10-CM | POA: Diagnosis not present

## 2023-05-23 DIAGNOSIS — R5383 Other fatigue: Secondary | ICD-10-CM | POA: Diagnosis present

## 2023-05-23 DIAGNOSIS — D631 Anemia in chronic kidney disease: Secondary | ICD-10-CM

## 2023-05-23 LAB — COMPREHENSIVE METABOLIC PANEL
ALT: 10 U/L (ref 0–44)
AST: 15 U/L (ref 15–41)
Albumin: 3.6 g/dL (ref 3.5–5.0)
Alkaline Phosphatase: 50 U/L (ref 38–126)
Anion gap: 13 (ref 5–15)
BUN: 59 mg/dL — ABNORMAL HIGH (ref 8–23)
CO2: 23 mmol/L (ref 22–32)
Calcium: 8.9 mg/dL (ref 8.9–10.3)
Chloride: 99 mmol/L (ref 98–111)
Creatinine, Ser: 9.16 mg/dL — ABNORMAL HIGH (ref 0.61–1.24)
GFR, Estimated: 6 mL/min — ABNORMAL LOW (ref >60)
Glucose, Bld: 110 mg/dL — ABNORMAL HIGH (ref 70–99)
Potassium: 3.7 mmol/L (ref 3.5–5.1)
Sodium: 135 mmol/L (ref 135–145)
Total Bilirubin: 0.5 mg/dL (ref <1.2)
Total Protein: 7.4 g/dL (ref 6.5–8.1)

## 2023-05-23 LAB — CBC
HCT: 21.2 % — ABNORMAL LOW (ref 39.0–52.0)
Hemoglobin: 6.6 g/dL — ABNORMAL LOW (ref 13.0–17.0)
MCH: 24.1 pg — ABNORMAL LOW (ref 26.0–34.0)
MCHC: 31.1 g/dL (ref 30.0–36.0)
MCV: 77.4 fL — ABNORMAL LOW (ref 80.0–100.0)
Platelets: 340 10*3/uL (ref 150–400)
RBC: 2.74 MIL/uL — ABNORMAL LOW (ref 4.22–5.81)
RDW: 18.5 % — ABNORMAL HIGH (ref 11.5–15.5)
WBC: 9.8 10*3/uL (ref 4.0–10.5)
nRBC: 0 % (ref 0.0–0.2)

## 2023-05-23 LAB — PREPARE RBC (CROSSMATCH)

## 2023-05-23 MED ORDER — SODIUM CHLORIDE 0.9 % IV SOLN
10.0000 mL/h | Freq: Once | INTRAVENOUS | Status: AC
  Administered 2023-05-23: 10 mL/h via INTRAVENOUS

## 2023-05-23 NOTE — ED Provider Notes (Signed)
[facility] Provider Note   Event Date/Time   First MD Initiated Contact with Patient 05/23/23 [id number]     (approximate) History  Abnormal Lab  HPI Chase Rodriguez is a 62 y.o. male with a stated past medical history of end-stage renal disease who presents after he was told to present to the emergency department due to a hemoglobin of 6.6.  Patient states that he has been struggling with anemia since his renal failure diagnosis.  Patient currently denies any hematochezia, melena, hematemesis, or abdominal pain.  Patient does endorse mild fatigue that is worse with exertion. ROS: Patient currently denies any vision changes, tinnitus, difficulty speaking, facial droop, sore throat, chest pain, shortness of breath, abdominal pain, nausea/vomiting/diarrhea, dysuria, or weakness/numbness/paresthesias in any extremity   Physical Exam  Triage Vital Signs: ED Triage Vitals [05/23/23 0933]  Encounter Vitals Group     BP (!) 149/71     Systolic BP Percentile      Diastolic BP Percentile      Pulse Rate 87     Resp 18     Temp 97.6 F (36.4 C)     Temp Source Oral     SpO2 100 %     Weight 231 lb 14.8 oz (105.2 kg)     Height 5\' 11"  (1.803 m)     Head Circumference      Peak Flow      Pain Score 0     Pain Loc      Pain Education      Exclude from Growth Chart    Most recent vital signs: Vitals:   05/23/23 0933  BP: (!) 149/71  Pulse: 87  Resp: 18  Temp: 97.6 F (36.4 C)  SpO2: 100%   General: Awake, oriented x4. CV:  Good peripheral perfusion.  Resp:  Normal effort.  Abd:  No distention.  Other:  Well-developed, well-nourished middle-aged African-American male resting comfortably in no acute distress ED Results / Procedures / Treatments  Labs (all labs ordered are listed, but only abnormal results are displayed) Labs Reviewed  CBC - Abnormal; Notable for the following components:      Result Value   RBC 2.74 (*)    Hemoglobin 6.6 (*)    HCT 21.2  (*)    MCV 77.4 (*)    MCH 24.1 (*)    RDW 18.5 (*)    All other components within normal limits  COMPREHENSIVE METABOLIC PANEL - Abnormal; Notable for the following components:   Glucose, Bld 110 (*)    BUN 59 (*)    Creatinine, Ser 9.16 (*)    GFR, Estimated 6 (*)    All other components within normal limits  TYPE AND SCREEN  PREPARE RBC (CROSSMATCH)   PROCEDURES: Critical Care performed: Yes, see critical care procedure note(s) .1-3 Lead EKG Interpretation  Performed by: Merwyn Katos, MD Authorized by: Merwyn Katos, MD     Interpretation: normal     ECG rate:  71   ECG rate assessment: normal     Rhythm: sinus rhythm     Ectopy: none     Conduction: normal   CRITICAL CARE Performed by: Merwyn Katos  Total critical care time: 31 minutes  Critical care time was exclusive of separately billable procedures and treating other patients.  Critical care was necessary to treat or prevent imminent or life-threatening deterioration.  Critical care was time spent personally by me on the following activities: development of  treatment plan with patient and/or surrogate as well as nursing, discussions with consultants, evaluation of patient's response to treatment, examination of patient, obtaining history from patient or surrogate, ordering and performing treatments and interventions, ordering and review of laboratory studies, ordering and review of radiographic studies, pulse oximetry and re-evaluation of patient's condition.  MEDICATIONS ORDERED IN ED: Medications  0.9 %  sodium chloride infusion (0 mL/hr Intravenous Hold 05/23/23 1007)   IMPRESSION / MDM / ASSESSMENT AND PLAN / ED COURSE  I reviewed the triage vital signs and the nursing notes.                             The patient is on the cardiac monitor to evaluate for evidence of arrhythmia and/or significant heart rate changes. Patient's presentation is most consistent with acute presentation with potential  threat to life or bodily function. Patient presents for symptomatic anemia secondary to end-stage renal disease. Patient with known cause of anemia and follow up scheduled.  Patient shows no red flag signs for any GI hemorrhage at this time.  Patient denies any change in urine color or risk for GU losses.  Given 1 unit of blood with resolution of symptoms afterwards. Patient had no reaction to blood transfusion. Patient feels well on discharge with plan to follow up with PMD.   FINAL CLINICAL IMPRESSION(S) / ED DIAGNOSES   Final diagnoses:  Symptomatic anemia   Rx / DC Orders   ED Discharge Orders     None      Note:  This document was prepared using Dragon voice recognition software and may include unintentional dictation errors.   Merwyn Katos, MD 05/23/23 [phone]

## 2023-05-23 NOTE — ED Triage Notes (Signed)
Pt here with abnormal lab, hbg 6.5. Pt states he is a little weak.

## 2023-05-24 LAB — TYPE AND SCREEN
ABO/RH(D): O POS
Antibody Screen: NEGATIVE
Unit division: 0

## 2023-05-24 LAB — BPAM RBC
Blood Product Expiration Date: 202412312359
ISSUE DATE / TIME: 202412261223
Unit Type and Rh: 5100

## 2023-06-04 ENCOUNTER — Encounter: Payer: Self-pay | Admitting: *Deleted

## 2023-06-04 ENCOUNTER — Emergency Department
Admission: EM | Admit: 2023-06-04 | Discharge: 2023-06-04 | Disposition: A | Discharge status: Home / Self Care | Payer: 59 | Attending: Emergency Medicine | Admitting: Emergency Medicine

## 2023-06-04 ENCOUNTER — Other Ambulatory Visit: Payer: Self-pay

## 2023-06-04 DIAGNOSIS — N186 End stage renal disease: Secondary | ICD-10-CM | POA: Insufficient documentation

## 2023-06-04 DIAGNOSIS — R7989 Other specified abnormal findings of blood chemistry: Secondary | ICD-10-CM | POA: Diagnosis present

## 2023-06-04 DIAGNOSIS — D631 Anemia in chronic kidney disease: Secondary | ICD-10-CM | POA: Diagnosis not present

## 2023-06-04 DIAGNOSIS — Z992 Dependence on renal dialysis: Secondary | ICD-10-CM | POA: Diagnosis not present

## 2023-06-04 DIAGNOSIS — D638 Anemia in other chronic diseases classified elsewhere: Secondary | ICD-10-CM

## 2023-06-04 LAB — BASIC METABOLIC PANEL
Anion gap: 17 — ABNORMAL HIGH (ref 5–15)
BUN: 67 mg/dL — ABNORMAL HIGH (ref 8–23)
CO2: 21 mmol/L — ABNORMAL LOW (ref 22–32)
Calcium: 9.2 mg/dL (ref 8.9–10.3)
Chloride: 99 mmol/L (ref 98–111)
Creatinine, Ser: 9.39 mg/dL — ABNORMAL HIGH (ref 0.61–1.24)
GFR, Estimated: 6 mL/min — ABNORMAL LOW (ref >60)
Glucose, Bld: 115 mg/dL — ABNORMAL HIGH (ref 70–99)
Potassium: 3.5 mmol/L (ref 3.5–5.1)
Sodium: 137 mmol/L (ref 135–145)

## 2023-06-04 LAB — CBC
HCT: 24.5 % — ABNORMAL LOW (ref 39.0–52.0)
Hemoglobin: 7.6 g/dL — ABNORMAL LOW (ref 13.0–17.0)
MCH: 24 pg — ABNORMAL LOW (ref 26.0–34.0)
MCHC: 31 g/dL (ref 30.0–36.0)
MCV: 77.3 fL — ABNORMAL LOW (ref 80.0–100.0)
Platelets: 342 10*3/uL (ref 150–400)
RBC: 3.17 MIL/uL — ABNORMAL LOW (ref 4.22–5.81)
RDW: 18.8 % — ABNORMAL HIGH (ref 11.5–15.5)
WBC: 11.6 10*3/uL — ABNORMAL HIGH (ref 4.0–10.5)
nRBC: 0 % (ref 0.0–0.2)

## 2023-06-04 NOTE — ED Provider Notes (Signed)
 [facility] Provider Note   Event Date/Time   First MD Initiated Contact with Patient 06/04/23 2031     (approximate) History  No chief complaint on file.  HPI GRAYLEN NOBOA is a 63 y.o. male status medical history of end-stage renal disease on dialysis who presents after he was told by his primary care physician to present to the emergency department due to abnormal labs.  Patient denies being told which labs were abnormal.  Patient denies any complaints at this time ROS: Patient currently denies any vision changes, tinnitus, difficulty speaking, facial droop, sore throat, chest pain, shortness of breath, abdominal pain, nausea/vomiting/diarrhea, dysuria, or weakness/numbness/paresthesias in any extremity   Physical Exam  Triage Vital Signs: ED Triage Vitals  Encounter Vitals Group     BP 06/04/23 1759 (!) 164/84     Systolic BP Percentile --      Diastolic BP Percentile --      Pulse Rate 06/04/23 1759 93     Resp 06/04/23 1759 20     Temp 06/04/23 1759 (!) 97.5 F (36.4 C)     Temp Source 06/04/23 1759 Oral     SpO2 06/04/23 1759 100 %     Weight 06/04/23 1758 215 lb (97.5 kg)     Height 06/04/23 1758 5' 11 (1.803 m)     Head Circumference --      Peak Flow --      Pain Score 06/04/23 1757 0     Pain Loc --      Pain Education --      Exclude from Growth Chart --    Most recent vital signs: Vitals:   06/04/23 1759  BP: (!) 164/84  Pulse: 93  Resp: 20  Temp: (!) 97.5 F (36.4 C)  SpO2: 100%   General: Awake, oriented x4. CV:  Good peripheral perfusion.  Resp:  Normal effort.  Abd:  No distention.  Other:  Middle-aged obese African-American male resting comfortably in no acute distress ED Results / Procedures / Treatments  Labs (all labs ordered are listed, but only abnormal results are displayed) Labs Reviewed  BASIC METABOLIC PANEL - Abnormal; Notable for the following components:      Result Value   CO2 21 (*)    Glucose, Bld  115 (*)    BUN 67 (*)    Creatinine, Ser 9.39 (*)    GFR, Estimated 6 (*)    Anion gap 17 (*)    All other components within normal limits  CBC - Abnormal; Notable for the following components:   WBC 11.6 (*)    RBC 3.17 (*)    Hemoglobin 7.6 (*)    HCT 24.5 (*)    MCV 77.3 (*)    MCH 24.0 (*)    RDW 18.8 (*)    All other components within normal limits   PROCEDURES: Critical Care performed: No .1-3 Lead EKG Interpretation  Performed by: Jossie Artist POUR, MD Authorized by: Jossie Artist POUR, MD     Interpretation: normal     ECG rate:  71   ECG rate assessment: normal     Rhythm: sinus rhythm     Ectopy: none     Conduction: normal    MEDICATIONS ORDERED IN ED: Medications - No data to display IMPRESSION / MDM / ASSESSMENT AND PLAN / ED COURSE  I reviewed the triage vital signs and the nursing notes.  The patient is on the cardiac monitor to evaluate for evidence of arrhythmia and/or significant heart rate changes. Patient's presentation is most consistent with acute presentation with potential threat to life or bodily function. Patient is a 63 year old male with the above-stated past medical history who presents after being told by his primary care physician to present for abnormal labs.  Of note, I have seen this patient recently within the last month needing a blood transfusion with a hemoglobin of 6.6 however today patient's hemoglobin is 7.6 and without any symptoms patient does not qualify for a transfusion at this time.  Patient was encouraged to follow-up with his primary care physician as well as his nephrologist for further management of his anemia secondary to chronic disease.  Dispo: Discharge home to follow-up with primary care and nephrology   FINAL CLINICAL IMPRESSION(S) / ED DIAGNOSES   Final diagnoses:  Anemia of chronic disease   Rx / DC Orders   ED Discharge Orders     None      Note:  This document was prepared using  Dragon voice recognition software and may include unintentional dictation errors.   Adahlia Stembridge K, MD 06/04/23 [phone]

## 2023-06-04 NOTE — ED Triage Notes (Signed)
 Pt ambulatory to triage.  Pt states his doctor called and told to come to ER for eval of abnormal labs.  Pt denies any pain.  Pt alert  speech clear.

## 2023-07-29 ENCOUNTER — Inpatient Hospital Stay: Payer: 59

## 2023-07-29 ENCOUNTER — Inpatient Hospital Stay: Payer: 59 | Attending: Oncology | Admitting: Oncology

## 2023-07-29 ENCOUNTER — Encounter: Payer: Self-pay | Admitting: Oncology

## 2023-07-29 VITALS — BP 135/76 | HR 80 | Temp 96.3°F | Resp 18 | Wt 215.5 lb

## 2023-07-29 DIAGNOSIS — R5382 Chronic fatigue, unspecified: Secondary | ICD-10-CM | POA: Insufficient documentation

## 2023-07-29 DIAGNOSIS — I120 Hypertensive chronic kidney disease with stage 5 chronic kidney disease or end stage renal disease: Secondary | ICD-10-CM | POA: Insufficient documentation

## 2023-07-29 DIAGNOSIS — N185 Chronic kidney disease, stage 5: Secondary | ICD-10-CM | POA: Diagnosis not present

## 2023-07-29 DIAGNOSIS — E1122 Type 2 diabetes mellitus with diabetic chronic kidney disease: Secondary | ICD-10-CM | POA: Diagnosis not present

## 2023-07-29 DIAGNOSIS — D631 Anemia in chronic kidney disease: Secondary | ICD-10-CM | POA: Insufficient documentation

## 2023-07-29 DIAGNOSIS — F17210 Nicotine dependence, cigarettes, uncomplicated: Secondary | ICD-10-CM | POA: Insufficient documentation

## 2023-07-29 LAB — CBC WITH DIFFERENTIAL/PLATELET
Abs Immature Granulocytes: 0.04 10*3/uL (ref 0.00–0.07)
Basophils Absolute: 0 10*3/uL (ref 0.0–0.1)
Basophils Relative: 0 %
Eosinophils Absolute: 0.1 10*3/uL (ref 0.0–0.5)
Eosinophils Relative: 2 %
HCT: 30.4 % — ABNORMAL LOW (ref 39.0–52.0)
Hemoglobin: 9.5 g/dL — ABNORMAL LOW (ref 13.0–17.0)
Immature Granulocytes: 1 %
Lymphocytes Relative: 20 %
Lymphs Abs: 1.5 10*3/uL (ref 0.7–4.0)
MCH: 25.3 pg — ABNORMAL LOW (ref 26.0–34.0)
MCHC: 31.3 g/dL (ref 30.0–36.0)
MCV: 80.9 fL (ref 80.0–100.0)
Monocytes Absolute: 0.8 10*3/uL (ref 0.1–1.0)
Monocytes Relative: 11 %
Neutro Abs: 4.8 10*3/uL (ref 1.7–7.7)
Neutrophils Relative %: 66 %
Platelets: 278 10*3/uL (ref 150–400)
RBC: 3.76 MIL/uL — ABNORMAL LOW (ref 4.22–5.81)
RDW: 23.6 % — ABNORMAL HIGH (ref 11.5–15.5)
WBC: 7.3 10*3/uL (ref 4.0–10.5)
nRBC: 0 % (ref 0.0–0.2)

## 2023-07-29 LAB — RETIC PANEL
Immature Retic Fract: 13.2 % (ref 2.3–15.9)
RBC.: 3.74 MIL/uL — ABNORMAL LOW (ref 4.22–5.81)
Retic Count, Absolute: 65.5 10*3/uL (ref 19.0–186.0)
Retic Ct Pct: 1.8 % (ref 0.4–3.1)
Reticulocyte Hemoglobin: 27 pg — ABNORMAL LOW (ref >27.9)

## 2023-07-29 LAB — IRON AND TIBC
Iron: 197 ug/dL — ABNORMAL HIGH (ref 45–182)
Saturation Ratios: 49 % — ABNORMAL HIGH (ref 17.9–39.5)
TIBC: 402 ug/dL (ref 250–450)
UIBC: 205 ug/dL

## 2023-07-29 LAB — FERRITIN: Ferritin: 38 ng/mL (ref 24–336)

## 2023-07-29 NOTE — Assessment & Plan Note (Addendum)
 Labs are reviewed and discussed with patient.  Likely anemia is due to CKD.   Lab Results  Component Value Date   HGB 9.5 (L) 07/29/2023   TIBC 402 07/29/2023   IRONPCTSAT 49 (H) 07/29/2023   FERRITIN 38 07/29/2023    Recommend IV venofer to increase iron store.  I discussed about potential risks including but not limited to allergic reactions/infusion reactions including anaphylactic reactions, diarrhea, phlebitis, high blood pressure, wheezing, SOB, skin rash, weight gain,dark urine, leg swelling, back pain, headache, nausea and fatigue, etc.  Plan IV venofer weekly x 3

## 2023-07-29 NOTE — Assessment & Plan Note (Signed)
 Follow-up with nephrology

## 2023-07-29 NOTE — Progress Notes (Signed)
 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  Likely anemia is due to CKD.   Lab Results  Component Value Date   HGB 9.5 (L) 07/29/2023   TIBC 402 07/29/2023   IRONPCTSAT 49 (H) 07/29/2023   FERRITIN 38 07/29/2023    Recommend IV venofer to increase iron store.  I discussed about potential risks including but not limited to allergic reactions/infusion reactions including anaphylactic reactions, diarrhea, phlebitis, high blood pressure, wheezing, SOB, skin rash, weight gain,dark urine, leg swelling, back pain, headache, nausea and fatigue, etc.  Plan IV venofer weekly x 3   CKD (chronic kidney disease) stage 5, GFR less than 15 ml/min (HCC) Follow up with nephrology.   Orders Placed This Encounter  Procedures   Iron and TIBC    Standing Status:   Future    Number of Occurrences:   1    Expected Date:   07/29/2023    Expiration Date:   07/28/2024   Ferritin    Standing Status:   Future    Number of Occurrences:   1    Expected Date:   07/29/2023    Expiration Date:   01/29/2024   CBC with Differential/Platelet    Standing Status:   Future    Number of Occurrences:   1    Expected Date:   07/29/2023    Expiration Date:   07/28/2024   Retic Panel    Standing Status:   Future    Number of Occurrences:   1    Expected Date:   07/29/2023    Expiration Date:   07/28/2024   Patient will follow up with me in 6 weeks.  If he starts on HD in the near future, nephrology will take over IV Venfoer and EPO through dialysis.  All questions were answered. The patient knows to call the clinic with any problems, questions or concerns.  Rickard Patience, MD, PhD [facility] Hematology Oncology 07/29/2023    HISTORY OF PRESENTING ILLNESS:   Chase Rodriguez is a  63 y.o.  male with PMH listed below was seen in consultation at the request of  Kathrin Greathouse, MD  for evaluation of anemia due to CKD.   He has chronic kidney disease and is not currently on dialysis. He still produces a good amount of urine and sees his nephrologist monthly. There have been discussions about the possibility of future dialysis. He has had a dialysis AV fistula placed and is prepared to start dialysis when deemed necessary by his nephrologist.   He has anemia with hemoglobin of 7.6 on 06/04/2023. He was recommended to get IV iron treatments.  He has been unable to attend previous appointments for iron infusion at [facility] due to transportation barriers.  which is low. He is currently taking oral iron supplements. He was Interested in transplant, however he is not eligible currently due to smoking and marijuana.  Denies hematochezia, hematuria, hematemesis, epistaxis, black tarry stool.    Chronic fatigue.  h/o Hep C, which he reported was treated a couple years prior and cleared.     MEDICAL HISTORY:  Past Medical History:  Diagnosis Date   Diabetes mellitus without complication (HCC)  Hepatitis    High cholesterol    History of kidney stones    Hypertension    Stage 3b chronic kidney disease (CKD) (HCC)     SURGICAL HISTORY: Past Surgical History:  Procedure Laterality Date   AV FISTULA PLACEMENT Left 06/14/2022   Procedure: ARTERIOVENOUS (AV) FISTULA CREATION (BRACHIAL CEPHALIC);  Surgeon: Annice Needy, MD;  Location: [facility] ORS;  Service: Vascular;  Laterality: Left;   TONSILLECTOMY      SOCIAL HISTORY: Social History   Socioeconomic History   Marital status: Married    Spouse name: Not on file   Number of children: Not on file   Years of education: Not on file   Highest education level: Not on file  Occupational History   Not on file  Tobacco Use   Smoking status: Every Day    Current  packs/day: 0.50    Average packs/day: 0.5 packs/day for 39.0 years (19.5 ttl pk-yrs)    Types: Cigarettes   Smokeless tobacco: Never  Substance and Sexual Activity   Alcohol use: Yes    Comment: sometimes 1-2 drinks   Drug use: No   Sexual activity: Not on file  Other Topics Concern   Not on file  Social History Narrative   Not on file   Social Drivers of Health   Financial Resource Strain: Low Risk  (07/29/2023)   Overall Financial Resource Strain (CARDIA)    Difficulty of Paying Living Expenses: Not very hard  Food Insecurity: No Food Insecurity (07/29/2023)   Hunger Vital Sign    Worried About Running Out of Food in the Last Year: Never true    Ran Out of Food in the Last Year: Never true  Transportation Needs: No Transportation Needs (07/29/2023)   PRAPARE - Administrator, Civil Service (Medical): No    Lack of Transportation (Non-Medical): No  Physical Activity: Not on file  Stress: Not on file  Social Connections: Not on file  Intimate Partner Violence: Not At Risk (07/29/2023)   Humiliation, Afraid, Rape, and Kick questionnaire    Fear of Current or Ex-Partner: No    Emotionally Abused: No    Physically Abused: No    Sexually Abused: No    FAMILY HISTORY: Family History  Problem Relation Age of Onset   Diabetes Mother    Vascular Disease Father     ALLERGIES:  has no known allergies.  MEDICATIONS:  Current Outpatient Medications  Medication Sig Dispense Refill   amLODipine (NORVASC) 10 MG tablet Take 1 tablet (10 mg total) by mouth once daily. 30 tablet 3   atorvastatin (LIPITOR) 20 MG tablet Take 1 tablet (20 mg total) by mouth once daily. 30 tablet 10   Blood Glucose Monitoring Suppl (RIGHTEST GM550 BLOOD GLUCOSE) w/Device KIT Use as directed to check blood sugar 1 kit 0   empagliflozin (JARDIANCE) 25 MG TABS tablet Take by mouth daily.     fenofibrate 160 MG tablet Take 1 tablet (160 mg total) by mouth once daily. 30 tablet 2   insulin detemir  (LEVEMIR) 100 UNIT/ML injection Inject 0.2 mLs (20 Units total) into the skin 2 (two) times daily. 10 mL 11   Insulin Syringe-Needle U-100 (BD VEO INSULIN SYRINGE U/F) 31G X 15/64" 0.5 ML MISC USE AS DIRECTED 60 each PRN   labetalol (NORMODYNE) 300 MG tablet Take (1/2) tablet (150 mg total) by mouth 2 (two) times daily. 60 tablet 3   levETIRAcetam (KEPPRA) 500 MG tablet Take 1 tablet (500 mg  total) by mouth 2 (two) times daily. 60 tablet 5   Rightest GL300 Lancets MISC Use as directed to test blood sugar 100 each 11   sodium bicarbonate 650 MG tablet Take 650 mg by mouth 2 (two) times daily.     glucose blood (RIGHTEST GS550 BLOOD GLUCOSE) test strip Use as directed to test blood sugar (Patient not taking: Reported on 05/01/2022) 100 each 11   No current facility-administered medications for this visit.    Review of Systems  Constitutional:  Negative for appetite change, chills, fever and unexpected weight change.  HENT:   Negative for hearing loss and voice change.   Eyes:  Negative for eye problems and icterus.  Respiratory:  Negative for chest tightness, cough and shortness of breath.   Cardiovascular:  Negative for chest pain and leg swelling.  Gastrointestinal:  Negative for abdominal distention, abdominal pain and blood in stool.  Endocrine: Negative for hot flashes.  Genitourinary:  Negative for difficulty urinating, dysuria and frequency.   Musculoskeletal:  Negative for arthralgias.  Skin:  Negative for itching and rash.  Neurological:  Negative for light-headedness and numbness.  Hematological:  Negative for adenopathy. Does not bruise/bleed easily.  Psychiatric/Behavioral:  Negative for confusion.    PHYSICAL EXAMINATION: ECOG PERFORMANCE STATUS: 1 - Symptomatic but completely ambulatory Vitals:   07/29/23 0926  BP: 135/76  Pulse: 80  Resp: 18  Temp: (!) 96.3 F (35.7 C)  SpO2: 100%   Filed Weights   07/29/23 0926  Weight: 215 lb 8 oz (97.8 kg)    Physical  Exam Constitutional:      General: He is not in acute distress. HENT:     Head: Normocephalic and atraumatic.  Eyes:     General: No scleral icterus. Cardiovascular:     Rate and Rhythm: Normal rate and regular rhythm.     Heart sounds: Murmur heard.  Pulmonary:     Effort: Pulmonary effort is normal. No respiratory distress.     Breath sounds: No wheezing.  Abdominal:     General: Bowel sounds are normal. There is no distension.     Palpations: Abdomen is soft.  Musculoskeletal:        General: Normal range of motion.     Cervical back: Normal range of motion and neck supple.  Skin:    General: Skin is warm and dry.     Findings: No erythema or rash.  Neurological:     Mental Status: He is alert and oriented to person, place, and time. Mental status is at baseline.  Psychiatric:        Mood and Affect: Mood normal.     LABORATORY DATA:  I have reviewed the data as listed    Latest Ref Rng & Units 07/29/2023    9:53 AM 06/04/2023    6:08 PM 05/23/2023    9:39 AM  CBC  WBC 4.0 - 10.5 K/uL 7.3  11.6  9.8   Hemoglobin 13.0 - 17.0 g/dL 9.5  7.6  6.6   Hematocrit 39.0 - 52.0 % 30.4  24.5  21.2   Platelets 150 - 400 K/uL 278  342  340       Latest Ref Rng & Units 06/04/2023    6:08 PM 05/23/2023    9:39 AM 06/14/2022   12:25 PM  CMP  Glucose 70 - 99 mg/dL 696  295  284   BUN 8 - 23 mg/dL 67  59  50   Creatinine 0.61 - 1.24  mg/dL 2.53  6.64  4.03   Sodium 135 - 145 mmol/L 137  135  135   Potassium 3.5 - 5.1 mmol/L 3.5  3.7  3.5   Chloride 98 - 111 mmol/L 99  99  103   CO2 22 - 32 mmol/L 21  23  20    Calcium 8.9 - 10.3 mg/dL 9.2  8.9  7.4   Total Protein 6.5 - 8.1 g/dL  7.4    Total Bilirubin <1.2 mg/dL  0.5    Alkaline Phos 38 - 126 U/L  50    AST 15 - 41 U/L  15    ALT 0 - 44 U/L  10        RADIOGRAPHIC STUDIES: I have personally reviewed the radiological images as listed and agreed with the findings in the report. No results found.

## 2023-07-30 ENCOUNTER — Telehealth: Payer: Self-pay

## 2023-07-30 NOTE — Telephone Encounter (Signed)
 Chase Rodriguez will you please schedule and notify patient of appointment dates and times.

## 2023-07-30 NOTE — Telephone Encounter (Signed)
-----   Message from Rickard Patience sent at 07/29/2023  5:29 PM EST ----- Please arrange him to get IV venofer weekly x 3 Follow up appt lab prior to MD +/- Venofer in 6 weeks. Labs are ordered. Thanks.   zy

## 2023-08-05 ENCOUNTER — Encounter: Payer: Self-pay | Admitting: Oncology

## 2023-08-05 NOTE — Telephone Encounter (Signed)
 Attempt made to contact pt x2. No answer. VM not setup.

## 2023-08-09 NOTE — Telephone Encounter (Signed)
 Insurance has denied venofer. Dr Cathie Hoops will do P2P, but for now we will cancel iron infusion on  3/17. Attempt made to call pt and let him know but unable to reach him. Unable to leave VM.   Please cancel iron infusion on 3/17.   p2p can be scheduled by calling [phone] ID# [id number] request# [id number]   P2P status pending.

## 2023-08-12 ENCOUNTER — Inpatient Hospital Stay

## 2023-08-12 ENCOUNTER — Telehealth: Payer: Self-pay

## 2023-08-12 NOTE — Telephone Encounter (Signed)
-----   Message from [place] E sent at 08/12/2023  8:27 AM EDT ----- Future appts are canceled and noted. ----- Message ----- From: Rickard Patience, MD Sent: 08/09/2023   5:36 PM EDT To: Coralee Rud, RN; Paulita Fujita, CMA; #  Patient will start HD next week.  nephrology will take over IV venofer. No need for follow up. Please cancel his future follow up appt.

## 2023-08-19 ENCOUNTER — Inpatient Hospital Stay

## 2023-08-26 ENCOUNTER — Inpatient Hospital Stay

## 2023-09-09 ENCOUNTER — Other Ambulatory Visit

## 2023-09-13 ENCOUNTER — Encounter: Payer: Self-pay | Admitting: Emergency Medicine

## 2023-09-13 ENCOUNTER — Encounter: Payer: Self-pay | Admitting: Oncology

## 2023-09-13 ENCOUNTER — Emergency Department

## 2023-09-13 ENCOUNTER — Other Ambulatory Visit: Payer: Self-pay

## 2023-09-13 ENCOUNTER — Observation Stay
Admission: EM | Admit: 2023-09-13 | Discharge: 2023-09-14 | Disposition: A | Discharge status: Home / Self Care | Attending: Internal Medicine | Admitting: Internal Medicine

## 2023-09-13 DIAGNOSIS — D631 Anemia in chronic kidney disease: Secondary | ICD-10-CM | POA: Diagnosis not present

## 2023-09-13 DIAGNOSIS — S0990XA Unspecified injury of head, initial encounter: Secondary | ICD-10-CM

## 2023-09-13 DIAGNOSIS — Z992 Dependence on renal dialysis: Secondary | ICD-10-CM | POA: Insufficient documentation

## 2023-09-13 DIAGNOSIS — F1090 Alcohol use, unspecified, uncomplicated: Secondary | ICD-10-CM | POA: Insufficient documentation

## 2023-09-13 DIAGNOSIS — E1122 Type 2 diabetes mellitus with diabetic chronic kidney disease: Secondary | ICD-10-CM | POA: Diagnosis not present

## 2023-09-13 DIAGNOSIS — W19XXXA Unspecified fall, initial encounter: Secondary | ICD-10-CM | POA: Diagnosis not present

## 2023-09-13 DIAGNOSIS — G40909 Epilepsy, unspecified, not intractable, without status epilepticus: Secondary | ICD-10-CM | POA: Diagnosis not present

## 2023-09-13 DIAGNOSIS — Z79899 Other long term (current) drug therapy: Secondary | ICD-10-CM | POA: Insufficient documentation

## 2023-09-13 DIAGNOSIS — R270 Ataxia, unspecified: Secondary | ICD-10-CM

## 2023-09-13 DIAGNOSIS — R531 Weakness: Secondary | ICD-10-CM

## 2023-09-13 DIAGNOSIS — I63532 Cerebral infarction due to unspecified occlusion or stenosis of left posterior cerebral artery: Principal | ICD-10-CM | POA: Insufficient documentation

## 2023-09-13 DIAGNOSIS — R55 Syncope and collapse: Secondary | ICD-10-CM | POA: Insufficient documentation

## 2023-09-13 DIAGNOSIS — S0081XA Abrasion of other part of head, initial encounter: Secondary | ICD-10-CM | POA: Insufficient documentation

## 2023-09-13 DIAGNOSIS — N186 End stage renal disease: Secondary | ICD-10-CM | POA: Diagnosis not present

## 2023-09-13 DIAGNOSIS — F17210 Nicotine dependence, cigarettes, uncomplicated: Secondary | ICD-10-CM | POA: Insufficient documentation

## 2023-09-13 DIAGNOSIS — R260 Ataxic gait: Secondary | ICD-10-CM | POA: Diagnosis present

## 2023-09-13 DIAGNOSIS — E119 Type 2 diabetes mellitus without complications: Secondary | ICD-10-CM

## 2023-09-13 DIAGNOSIS — Z794 Long term (current) use of insulin: Secondary | ICD-10-CM | POA: Diagnosis not present

## 2023-09-13 DIAGNOSIS — I120 Hypertensive chronic kidney disease with stage 5 chronic kidney disease or end stage renal disease: Secondary | ICD-10-CM | POA: Diagnosis not present

## 2023-09-13 DIAGNOSIS — I10 Essential (primary) hypertension: Secondary | ICD-10-CM | POA: Diagnosis present

## 2023-09-13 DIAGNOSIS — I639 Cerebral infarction, unspecified: Secondary | ICD-10-CM

## 2023-09-13 DIAGNOSIS — S0083XA Contusion of other part of head, initial encounter: Secondary | ICD-10-CM

## 2023-09-13 LAB — BASIC METABOLIC PANEL WITH GFR
Anion gap: 16 — ABNORMAL HIGH (ref 5–15)
BUN: 15 mg/dL (ref 8–23)
CO2: 31 mmol/L (ref 22–32)
Calcium: 9.9 mg/dL (ref 8.9–10.3)
Chloride: 92 mmol/L — ABNORMAL LOW (ref 98–111)
Creatinine, Ser: 4.7 mg/dL — ABNORMAL HIGH (ref 0.61–1.24)
GFR, Estimated: 13 mL/min — ABNORMAL LOW (ref >60)
Glucose, Bld: 167 mg/dL — ABNORMAL HIGH (ref 70–99)
Potassium: 2.9 mmol/L — ABNORMAL LOW (ref 3.5–5.1)
Sodium: 139 mmol/L (ref 135–145)

## 2023-09-13 LAB — TROPONIN I (HIGH SENSITIVITY): Troponin I (High Sensitivity): 26 ng/L — ABNORMAL HIGH (ref <18)

## 2023-09-13 LAB — CBC WITH DIFFERENTIAL/PLATELET
Abs Immature Granulocytes: 0.08 10*3/uL — ABNORMAL HIGH (ref 0.00–0.07)
Basophils Absolute: 0 10*3/uL (ref 0.0–0.1)
Basophils Relative: 0 %
Eosinophils Absolute: 0.1 10*3/uL (ref 0.0–0.5)
Eosinophils Relative: 2 %
HCT: 33.8 % — ABNORMAL LOW (ref 39.0–52.0)
Hemoglobin: 10.7 g/dL — ABNORMAL LOW (ref 13.0–17.0)
Immature Granulocytes: 1 %
Lymphocytes Relative: 10 %
Lymphs Abs: 0.9 10*3/uL (ref 0.7–4.0)
MCH: 26.7 pg (ref 26.0–34.0)
MCHC: 31.7 g/dL (ref 30.0–36.0)
MCV: 84.3 fL (ref 80.0–100.0)
Monocytes Absolute: 0.9 10*3/uL (ref 0.1–1.0)
Monocytes Relative: 10 %
Neutro Abs: 7.4 10*3/uL (ref 1.7–7.7)
Neutrophils Relative %: 77 %
Platelets: 263 10*3/uL (ref 150–400)
RBC: 4.01 MIL/uL — ABNORMAL LOW (ref 4.22–5.81)
RDW: 18.3 % — ABNORMAL HIGH (ref 11.5–15.5)
WBC: 9.5 10*3/uL (ref 4.0–10.5)
nRBC: 0 % (ref 0.0–0.2)

## 2023-09-13 LAB — ETHANOL: Alcohol, Ethyl (B): <10 mg/dL (ref <10)

## 2023-09-13 MED ORDER — BACITRACIN ZINC 500 UNIT/GM EX OINT
TOPICAL_OINTMENT | Freq: Once | CUTANEOUS | Status: AC
  Filled 2023-09-13: qty 3.6

## 2023-09-13 MED ORDER — POTASSIUM CHLORIDE CRYS ER 20 MEQ PO TBCR
40.0000 meq | EXTENDED_RELEASE_TABLET | Freq: Once | ORAL | Status: AC
  Administered 2023-09-13: 40 meq via ORAL
  Filled 2023-09-13: qty 2

## 2023-09-13 NOTE — ED Triage Notes (Signed)
 Patient fell from a truck tailgate in a sitting position, hitting his head.  He denies LOC but doesn't remember falling.  Abrasions to forehead.  Patient denies blood thinners.

## 2023-09-13 NOTE — ED Provider Notes (Signed)
 [facility] Provider Note    Event Date/Time   First MD Initiated Contact with Patient 09/13/23 1947     (approximate)   History   Fall   HPI Chase Rodriguez is a 63 y.o. male with history of DM2, CKD stage V not on dialysis, prior epidural hematoma, HTN, HLD presenting today for fall.  Patient was reportedly sitting on a truck tailgate when he fell over.  He does not remember the fall and unsure loss of consciousness.  He has notable abrasions to the front of his face.  Not on any blood thinners.  Has pain to the front of his face but otherwise denies injury elsewhere.  No neck pain, arm pain, leg pain, chest pain, abdominal pain.  No back pain.     Physical Exam   Triage Vital Signs: ED Triage Vitals  Encounter Vitals Group     BP 09/13/23 1951 134/74     Systolic BP Percentile --      Diastolic BP Percentile --      Pulse Rate 09/13/23 1951 79     Resp 09/13/23 1951 20     Temp 09/13/23 1951 98.3 F (36.8 C)     Temp Source 09/13/23 1951 Oral     SpO2 09/13/23 1951 94 %     Weight 09/13/23 1953 216 lb 0.8 oz (98 kg)     Height 09/13/23 1953 5\' 11"  (1.803 m)     Head Circumference --      Peak Flow --      Pain Score 09/13/23 1952 5     Pain Loc --      Pain Education --      Exclude from Growth Chart --     Most recent vital signs: Vitals:   09/13/23 1951  BP: 134/74  Pulse: 79  Resp: 20  Temp: 98.3 F (36.8 C)  SpO2: 94%   Physical Exam: I have reviewed the vital signs and nursing notes. General: Awake, alert, no acute distress.  Nontoxic appearing. Head: Multiple abrasions to forehead and nasal bridge, normocephalic.   ENT:  EOM intact, PERRL. Oral mucosa is pink and moist with no lesions. Neck: Neck is supple with full range of motion, no C-spine tenderness to palpation Cardiovascular:  RRR, No murmurs. Peripheral pulses palpable and equal bilaterally. Respiratory:  Symmetrical chest wall expansion.  No rhonchi, rales, or  wheezes.  Good air movement throughout.  No use of accessory muscles.   Musculoskeletal:  No cyanosis or edema. Moving extremities with full ROM.  Nontender to palpation throughout bilateral upper and lower extremities.  No chest wall tenderness.  No T or L-spine tenderness palpation Abdomen:  Soft, nontender, nondistended. Neuro:  GCS 15, moving all four extremities, interacting appropriately. Speech clear. Psych:  Calm, appropriate.   Skin:  Warm, dry, no rash.    ED Results / Procedures / Treatments   Labs (all labs ordered are listed, but only abnormal results are displayed) Labs Reviewed  CBC WITH DIFFERENTIAL/PLATELET - Abnormal; Notable for the following components:      Result Value   RBC 4.01 (*)    Hemoglobin 10.7 (*)    HCT 33.8 (*)    RDW 18.3 (*)    Abs Immature Granulocytes 0.08 (*)    All other components within normal limits  BASIC METABOLIC PANEL WITH GFR - Abnormal; Notable for the following components:   Potassium 2.9 (*)    Chloride 92 (*)  Glucose, Bld 167 (*)    Creatinine, Ser 4.70 (*)    GFR, Estimated 13 (*)    Anion gap 16 (*)    All other components within normal limits  TROPONIN I (HIGH SENSITIVITY) - Abnormal; Notable for the following components:   Troponin I (High Sensitivity) 26 (*)    All other components within normal limits  ETHANOL     EKG My EKG interpretation: Rate of 77, normal sinus rhythm, normal axis, normal intervals.  No acute ST elevations or depressions   RADIOLOGY Independently interpreted CT head, C-spine, max face with no acute traumatic pathology   PROCEDURES:  Critical Care performed: No  Procedures   MEDICATIONS ORDERED IN ED: Medications  bacitracin  ointment (has no administration in time range)  potassium chloride  SA (KLOR-CON  M) CR tablet 40 mEq (40 mEq Oral Given 09/13/23 2249)     IMPRESSION / MDM / ASSESSMENT AND PLAN / ED COURSE  I reviewed the triage vital signs and the nursing notes.                               Differential diagnosis includes, but is not limited to, seizure, syncope, ICH, cervical spine injury, facial fractures, facial abrasions, electrolyte abnormality, alcohol intoxication  Patient's presentation is most consistent with acute presentation with potential threat to life or bodily function.  Patient is a 63 year old male presenting today for fall with facial injury.  Does not tiler member the fall and unsure exactly what caused it.  Has notable facial abrasions but denies pain symptoms anywhere else.  Otherwise fully alert and oriented on arrival.  Will get CT imaging of head/face/C-spine as well as collect laboratory workup for further evaluation.  CT imaging from a trauma's perspective showed no acute traumatic findings.  Laboratory workup with mild hypokalemia otherwise known dialysis patient.  Troponin at 26 but no chest pain and lower than prior levels.  No concern for cardiac issue.  CBC unremarkable.  Attempted to get patient to stand and walk and he was very ataxic and unable to.  Wife noted this is actually been present within the past several days.  No obvious unilateral weakness or numbness.  Will get MRI for further evaluation to rule out CVA.  No witnessed seizure activity to definitively state this is being postictal.  Signed out to oncoming provider pending MRI but if still unable to walk after this will likely require admission.  The patient is on the cardiac monitor to evaluate for evidence of arrhythmia and/or significant heart rate changes. Clinical Course as of 09/13/23 2307  Fri Sep 13, 2023  2219 Laboratory workup reassuring.  No chest pain with no indication to repeat troponin as he is a dialysis patient.  Will ambulate him and have him trial p.o.  If successful then can go home at that time. [DW]  2224 Additional history obtained by wife at bedside.  He had received dialysis earlier in the day and often has low blood pressure following this.  No seizure  activity witnessed. [DW]    Clinical Course User Index [DW] Kandee Orion, MD     FINAL CLINICAL IMPRESSION(S) / ED DIAGNOSES   Final diagnoses:  Fall, initial encounter  Injury of head, initial encounter  Weakness     Rx / DC Orders   ED Discharge Orders     None        Note:  This document was prepared using  [employer] and may include unintentional dictation errors.   Kandee Orion, MD 09/13/23 [phone]

## 2023-09-14 ENCOUNTER — Observation Stay (HOSPITAL_BASED_OUTPATIENT_CLINIC_OR_DEPARTMENT_OTHER)
Admit: 2023-09-14 | Discharge: 2023-09-14 | Disposition: A | Discharge status: Home / Self Care | Attending: Internal Medicine | Admitting: Internal Medicine

## 2023-09-14 ENCOUNTER — Encounter: Payer: Self-pay | Admitting: Oncology

## 2023-09-14 DIAGNOSIS — I639 Cerebral infarction, unspecified: Secondary | ICD-10-CM

## 2023-09-14 DIAGNOSIS — R55 Syncope and collapse: Secondary | ICD-10-CM

## 2023-09-14 DIAGNOSIS — W19XXXA Unspecified fall, initial encounter: Secondary | ICD-10-CM

## 2023-09-14 DIAGNOSIS — I63532 Cerebral infarction due to unspecified occlusion or stenosis of left posterior cerebral artery: Secondary | ICD-10-CM | POA: Diagnosis not present

## 2023-09-14 DIAGNOSIS — S0083XA Contusion of other part of head, initial encounter: Secondary | ICD-10-CM

## 2023-09-14 DIAGNOSIS — N186 End stage renal disease: Secondary | ICD-10-CM

## 2023-09-14 DIAGNOSIS — R270 Ataxia, unspecified: Secondary | ICD-10-CM

## 2023-09-14 LAB — GLUCOSE, CAPILLARY: Glucose-Capillary: 99 mg/dL (ref 70–99)

## 2023-09-14 LAB — ECHOCARDIOGRAM COMPLETE
AR max vel: 2.69 cm2
AV Area VTI: 2.72 cm2
AV Area mean vel: 2.66 cm2
AV Mean grad: 6 mmHg
AV Peak grad: 11.6 mmHg
Ao pk vel: 1.7 m/s
Area-P 1/2: 4.31 cm2
Height: 71 in
MV VTI: 2.78 cm2
S' Lateral: 3.1 cm
Weight: 3456.81 [oz_av]

## 2023-09-14 LAB — LIPID PANEL
Cholesterol: 189 mg/dL (ref 0–200)
HDL: 36 mg/dL — ABNORMAL LOW (ref >40)
LDL Cholesterol: 126 mg/dL — ABNORMAL HIGH (ref 0–99)
Total CHOL/HDL Ratio: 5.3 ratio
Triglycerides: 135 mg/dL (ref <150)
VLDL: 27 mg/dL (ref 0–40)

## 2023-09-14 LAB — HEMOGLOBIN A1C
Hgb A1c MFr Bld: 5.1 % (ref 4.8–5.6)
Mean Plasma Glucose: 99.67 mg/dL

## 2023-09-14 LAB — HIV ANTIBODY (ROUTINE TESTING W REFLEX): HIV Screen 4th Generation wRfx: NONREACTIVE

## 2023-09-14 LAB — CBG MONITORING, ED: Glucose-Capillary: 122 mg/dL — ABNORMAL HIGH (ref 70–99)

## 2023-09-14 MED ORDER — HYDROCODONE-ACETAMINOPHEN 5-325 MG PO TABS: 1.0000 | ORAL_TABLET | ORAL | Status: DC | PRN

## 2023-09-14 MED ORDER — INSULIN ASPART 100 UNIT/ML IJ SOLN: 0.0000 [IU] | Freq: Three times a day (TID) | INTRAMUSCULAR | Status: DC

## 2023-09-14 MED ORDER — ACETAMINOPHEN 325 MG PO TABS: 650.0000 mg | ORAL_TABLET | Freq: Four times a day (QID) | ORAL | Status: DC | PRN

## 2023-09-14 MED ORDER — LEVETIRACETAM 500 MG PO TABS: 500.0000 mg | ORAL_TABLET | Freq: Two times a day (BID) | ORAL | Status: DC

## 2023-09-14 MED ORDER — ACETAMINOPHEN 160 MG/5ML PO SOLN: 650.0000 mg | ORAL | Status: DC | PRN

## 2023-09-14 MED ORDER — MORPHINE SULFATE (PF) 2 MG/ML IV SOLN: 2.0000 mg | INTRAVENOUS | Status: DC | PRN

## 2023-09-14 MED ORDER — INSULIN ASPART 100 UNIT/ML IJ SOLN: 0.0000 [IU] | Freq: Every day | INTRAMUSCULAR | Status: DC

## 2023-09-14 MED ORDER — ONDANSETRON HCL 4 MG/2ML IJ SOLN: 4.0000 mg | Freq: Four times a day (QID) | INTRAMUSCULAR | Status: DC | PRN

## 2023-09-14 MED ORDER — ISOSORBIDE MONONITRATE ER 30 MG PO TB24: 30.0000 mg | ORAL_TABLET | Freq: Every day | ORAL | Status: DC

## 2023-09-14 MED ORDER — LEVETIRACETAM 500 MG PO TABS: 500.0000 mg | ORAL_TABLET | Freq: Two times a day (BID) | ORAL | 1 refills | Status: AC

## 2023-09-14 MED ORDER — AMLODIPINE BESYLATE 10 MG PO TABS: 10.0000 mg | ORAL_TABLET | Freq: Every day | ORAL | Status: DC

## 2023-09-14 MED ORDER — SODIUM BICARBONATE 650 MG PO TABS: 650.0000 mg | ORAL_TABLET | Freq: Two times a day (BID) | ORAL | Status: DC

## 2023-09-14 MED ORDER — ACETAMINOPHEN 650 MG RE SUPP: 650.0000 mg | RECTAL | Status: DC | PRN

## 2023-09-14 MED ORDER — ONDANSETRON HCL 4 MG PO TABS: 4.0000 mg | ORAL_TABLET | Freq: Four times a day (QID) | ORAL | Status: DC | PRN

## 2023-09-14 MED ORDER — ACETAMINOPHEN 325 MG PO TABS: 650.0000 mg | ORAL_TABLET | ORAL | Status: DC | PRN

## 2023-09-14 MED ORDER — CLOPIDOGREL BISULFATE 75 MG PO TABS
75.0000 mg | ORAL_TABLET | Freq: Every day | ORAL | Status: DC
  Filled 2023-09-14: qty 1

## 2023-09-14 MED ORDER — FERROUS SULFATE 325 (65 FE) MG PO TABS: 325.0000 mg | ORAL_TABLET | Freq: Two times a day (BID) | ORAL | Status: DC

## 2023-09-14 MED ORDER — CALCITRIOL 0.25 MCG PO CAPS: 0.2500 ug | ORAL_CAPSULE | ORAL | Status: DC

## 2023-09-14 MED ORDER — ATORVASTATIN CALCIUM 20 MG PO TABS: 20.0000 mg | ORAL_TABLET | Freq: Every day | ORAL | Status: DC

## 2023-09-14 MED ORDER — CHLORTHALIDONE 25 MG PO TABS: 25.0000 mg | ORAL_TABLET | Freq: Every day | ORAL | Status: DC

## 2023-09-14 MED ORDER — ACETAMINOPHEN 325 MG RE SUPP: 650.0000 mg | Freq: Four times a day (QID) | RECTAL | Status: DC | PRN

## 2023-09-14 MED ORDER — HEPARIN SODIUM (PORCINE) 5000 UNIT/ML IJ SOLN: 5000.0000 [IU] | Freq: Three times a day (TID) | INTRAMUSCULAR | Status: DC

## 2023-09-14 MED ORDER — STROKE: EARLY STAGES OF RECOVERY BOOK: Freq: Once | Status: DC

## 2023-09-14 NOTE — Discharge Instructions (Addendum)
 Seizure precautions: Per [place]  DMV statutes, patients with seizures are not allowed to drive until they have been seizure-free for six months and cleared by a physician    Use caution when using heavy equipment or power tools. Avoid working on ladders or at heights. Take showers instead of baths. Ensure the water temperature is not too high on the home water heater. Do not go swimming alone. Do not lock yourself in a room alone (i.e. bathroom). When caring for infants or small children, sit down when holding, feeding, or changing them to minimize risk of injury to the child in the event you have a seizure. Maintain good sleep hygiene. Avoid alcohol.    If patient has another seizure, call 911 and bring them back to the ED if: A.  The seizure lasts longer than 5 minutes.      B.  The patient doesn't wake shortly after the seizure or has new problems such as difficulty seeing, speaking or moving following the seizure C.  The patient was injured during the seizure D.  The patient has a temperature over 102 F (39C) E.  The patient vomited during the seizure and now is having trouble breathing    During the Seizure   - First, ensure adequate ventilation and place patients on the floor on their left side  Loosen clothing around the neck and ensure the airway is patent. If the patient is clenching the teeth, do not force the mouth open with any object as this can cause severe damage - Remove all items from the surrounding that can be hazardous. The patient may be oblivious to what's happening and may not even know what he or she is doing. If the patient is confused and wandering, either gently guide him/her away and block access to outside areas - Reassure the individual and be comforting - Call 911. In most cases, the seizure ends before EMS arrives. However, there are cases when seizures may last over 3 to 5 minutes. Or the individual may have developed breathing difficulties or severe  injuries. If a pregnant patient or a person with diabetes develops a seizure, it is prudent to call an ambulance. - Finally, if the patient does not regain full consciousness, then call EMS. Most patients will remain confused for about 45 to 90 minutes after a seizure, so you must use judgment in calling for help. - Avoid restraints but make sure the patient is in a bed with padded side rails - Place the individual in a lateral position with the neck slightly flexed; this will help the saliva drain from the mouth and prevent the tongue from falling backward - Remove all nearby furniture and other hazards from the area - Provide verbal assurance as the individual is regaining consciousness - Provide the patient with privacy if possible - Call for help and start treatment as ordered by the caregiver    After the Seizure (Postictal Stage)   After a seizure, most patients experience confusion, fatigue, muscle pain and/or a headache. Thus, one should permit the individual to sleep. For the next few days, reassurance is essential. Being calm and helping reorient the person is also of importance.   Most seizures are painless and end spontaneously. Seizures are not harmful to others but can lead to complications such as stress on the lungs, brain and the heart. Individuals with prior lung problems may develop labored breathing and respiratory distress.   [facility] [place] [place]

## 2023-09-14 NOTE — Assessment & Plan Note (Signed)
 Possibly secondary to CVA but given history of seizure disorder we will also get EKG Continuous cardiac monitoring Neurologic checks with fall and aspiration precautions

## 2023-09-14 NOTE — Assessment & Plan Note (Addendum)
 Ataxic gait Last known well uncertain, symptoms for a week then fell several hours prior to arrival outside tPA window Permissive hypertension for first 24-48 hrs post stroke onset: Prn Labetalol  IV or Vasotec IV If BP greater than 220/120  Statins for LDL goal less than 70 ASA 81mg  daily, Plavix  75mg  daily x 3 weeks then monotherapy thereafter Telemetry, echo Avoid dextrose  containing fluids, Maintain euglycemia, euthermia Neuro checks q4 hrs x 24 hrs and then per shift Head of bed 30 degrees Physical therapy/Occupational therapy/Speech therapy if failed dysphagia screen Neurology consult to follow

## 2023-09-14 NOTE — H&P (Signed)
 History and Physical    Patient: Chase Rodriguez WGN:[id number] DOB: [birth year] DOA: 09/13/2023 DOS: the patient was seen and examined on 09/14/2023 PCP: [facility]  Patient coming from: Home  Chief Complaint:  Chief Complaint  Patient presents with   Fall    HPI: Chase Rodriguez is a 63 y.o. male with medical history significant for diabetes mellitu ESRD on dialysis MWF, hypertension, seizure disorder, tobacco use disorder, being admitted with an acute stroke.  His initial presentation was for a fall after he fell over from a sitting position while sitting in his truck tailgate, sustaining multiple abrasions to the midline of his face.  He does not recall the circumstances of him falling over and is unable to say whether he lost consciousness.  Patient received dialysis earlier in the day and per ED provider who got collateral information from his partner, his blood pressure was low following dialysis.  After an initial reassuring workup in the ED, they stood him up to ambulate however he was very ataxic and further diagnostic evaluation was initiated to evaluate for stroke.  Patient admits to not taking his seizure medicine about a year ED course and data review: Vitals within normal limits Labs for the most part unremarkable and in keeping with his dialysis status.  Potassium was 2.9, troponin 26, hemoglobin 10.7 EKG, personally viewed and interpreted showing sinus rhythm at 77 with prolonged QT Trauma imaging with CT head C-spine and maxillofacial showed no acute injury MRI brain showed multiple acute left parietal infarcts, posterior left MCA territory.  Patient given oral potassium Hospitalist consulted for admission for stroke workup     Past Medical History:  Diagnosis Date   Diabetes mellitus without complication (HCC)    Hepatitis    High cholesterol    History of kidney stones    Hypertension    Stage 3b chronic kidney disease (CKD) (HCC)    Past  Surgical History:  Procedure Laterality Date   AV FISTULA PLACEMENT Left 06/14/2022   Procedure: ARTERIOVENOUS (AV) FISTULA CREATION (BRACHIAL CEPHALIC);  Surgeon: Celso College, MD;  Location: [facility] ORS;  Service: Vascular;  Laterality: Left;   TONSILLECTOMY     Social History:  reports that he has been smoking cigarettes. He has a 19.5 pack-year smoking history. He has never used smokeless tobacco. He reports current alcohol use. He reports that he does not use drugs.  No Known Allergies  Family History  Problem Relation Age of Onset   Diabetes Mother    Vascular Disease Father     Prior to Admission medications   Medication Sig Start Date End Date Taking? Authorizing Provider  amLODipine  (NORVASC ) 10 MG tablet Take 1 tablet (10 mg total) by mouth once daily. 10/11/21     atorvastatin  (LIPITOR) 20 MG tablet Take 1 tablet (20 mg total) by mouth once daily. 03/30/21   Patel, Sona, MD  Blood Glucose Monitoring Suppl (RIGHTEST [phone] BLOOD GLUCOSE) w/Device KIT Use as directed to check blood sugar 06/02/21   Harrison, Keri K, RPH  empagliflozin (JARDIANCE) 25 MG TABS tablet Take by mouth daily.    [provider]  fenofibrate  160 MG tablet Take 1 tablet (160 mg total) by mouth once daily. 10/21/20   Aneita Baptise, PA  glucose blood (RIGHTEST [phone] BLOOD GLUCOSE) test strip Use as directed to test blood sugar Patient not taking: Reported on 05/01/2022 06/02/21   Harrison, Keri K, [facility]  insulin  detemir (LEVEMIR ) 100 UNIT/ML injection Inject 0.2 mLs (20  Units total) into the skin 2 (two) times daily. 03/30/21   Patel, Sona, MD  Insulin  Syringe-Needle U-100 (BD VEO INSULIN  SYRINGE U/F) 31G X 15/64" 0.5 ML MISC USE AS DIRECTED 06/27/21   Louanna Rouse, [facility]  labetalol  (NORMODYNE ) 300 MG tablet Take (1/2) tablet (150 mg total) by mouth 2 (two) times daily. 03/30/21   Patel, Sona, MD  levETIRAcetam  (KEPPRA ) 500 MG tablet Take 1 tablet (500 mg total) by mouth 2 (two) times daily. 03/30/21   Melvinia Stager,  MD  Rightest [phone] Lancets MISC Use as directed to test blood sugar 06/02/21   Louanna Rouse, [facility]  sodium bicarbonate  650 MG tablet Take 650 mg by mouth 2 (two) times daily.    [provider]  carvedilol  (COREG ) 6.25 MG tablet Take 1 tablet (6.25 mg total) by mouth 2 (two) times daily. 01/20/21 06/26/21  Aneita Baptise, PA  potassium chloride  SA (KLOR-CON  M) 20 MEQ tablet Take 1 tablet (20 mEq total) by mouth daily. 10/21/20 06/26/21  Aneita Baptise, PA    Physical Exam: Vitals:   09/13/23 1953 09/13/23 2300 09/14/23 0000 09/14/23 0030  BP:  (!) 154/81 128/76 (!) 141/79  Pulse:  79 78 78  Resp:   14 13  Temp:      TempSrc:      SpO2:  95% 97% 98%  Weight: 98 kg     Height: 5\' 11"  (1.803 m)      Physical Exam Vitals and nursing note reviewed.  Constitutional:      General: He is not in acute distress. HENT:     Head: Normocephalic.     Comments: Abrasions to forehead nose bridge and nose Cardiovascular:     Rate and Rhythm: Normal rate and regular rhythm.     Heart sounds: Normal heart sounds.  Pulmonary:     Effort: Pulmonary effort is normal.     Breath sounds: Normal breath sounds.  Abdominal:     Palpations: Abdomen is soft.     Tenderness: There is no abdominal tenderness.  Neurological:     Gait: Gait abnormal.     Labs on Admission: I have personally reviewed following labs and imaging studies  CBC: Recent Labs  Lab 09/13/23 2005  WBC 9.5  NEUTROABS 7.4  HGB 10.7*  HCT 33.8*  MCV 84.3  PLT 263   Basic Metabolic Panel: Recent Labs  Lab 09/13/23 2005  NA 139  K 2.9*  CL 92*  CO2 31  GLUCOSE 167*  BUN 15  CREATININE 4.70*  CALCIUM  9.9   GFR: Estimated Creatinine Clearance: 19.7 mL/min (A) (by C-G formula based on SCr of 4.7 mg/dL (H)). Liver Function Tests: No results for input(s): "AST", "ALT", "ALKPHOS", "BILITOT", "PROT", "ALBUMIN" in the last 168 hours. No results for input(s): "LIPASE", "AMYLASE" in the last 168 hours. No results  for input(s): "AMMONIA" in the last 168 hours. Coagulation Profile: No results for input(s): "INR", "PROTIME" in the last 168 hours. Cardiac Enzymes: No results for input(s): "CKTOTAL", "CKMB", "CKMBINDEX", "TROPONINI" in the last 168 hours. BNP (last 3 results) No results for input(s): "PROBNP" in the last 8760 hours. HbA1C: No results for input(s): "HGBA1C" in the last 72 hours. CBG: No results for input(s): "GLUCAP" in the last 168 hours. Lipid Profile: No results for input(s): "CHOL", "HDL", "LDLCALC", "TRIG", "CHOLHDL", "LDLDIRECT" in the last 72 hours. Thyroid Function Tests: No results for input(s): "TSH", "T4TOTAL", "FREET4", "T3FREE", "THYROIDAB" in the last 72 hours. Anemia Panel: No results for input(s): "  VITAMINB12", "FOLATE", "FERRITIN", "TIBC", "IRON", "RETICCTPCT" in the last 72 hours. Urine analysis:    Component Value Date/Time   COLORURINE YELLOW (A) 12/27/2021 1558   APPEARANCEUR HAZY (A) 12/27/2021 1558   LABSPEC 1.014 12/27/2021 1558   PHURINE 5.0 12/27/2021 1558   GLUCOSEU 150 (A) 12/27/2021 1558   HGBUR SMALL (A) 12/27/2021 1558   BILIRUBINUR NEGATIVE 12/27/2021 1558   KETONESUR NEGATIVE 12/27/2021 1558   PROTEINUR >=300 (A) 12/27/2021 1558   NITRITE NEGATIVE 12/27/2021 1558   LEUKOCYTESUR NEGATIVE 12/27/2021 1558    Radiological Exams on Admission: MR BRAIN WO CONTRAST Result Date: 09/14/2023 CLINICAL DATA:  ataxia, ongoing for a while, concern for CVA EXAM: MRI HEAD WITHOUT CONTRAST TECHNIQUE: Multiplanar, multiecho pulse sequences of the brain and surrounding structures were obtained without intravenous contrast. COMPARISON:  Same day CT head. FINDINGS: Brain: Many acute left parietal infarcts. Associated edema without mass effect. No evidence of acute hemorrhage, mass lesion, midline shift or hydrocephalus. Vascular: Major arterial flow voids are maintained at the skull base. Skull and upper cervical spine: Normal marrow signal. Sinuses/Orbits: Mostly  clear sinuses.  No acute orbital findings. Other: No mastoid effusions. IMPRESSION: Multiple acute left parietal infarcts (posterior left MCA territory). Electronically Signed   By: Stevenson Elbe M.D.   On: 09/14/2023 00:14   CT Head Wo Contrast Result Date: 09/13/2023 CLINICAL DATA:  Trauma, fall from truck with head and facial injury. Evaluation for hemorrhage in facial fractures. EXAM: CT HEAD WITHOUT CONTRAST CT MAXILLOFACIAL WITHOUT CONTRAST CT CERVICAL SPINE WITHOUT CONTRAST TECHNIQUE: Multidetector CT imaging of the head, cervical spine, and maxillofacial structures were performed using the standard protocol without intravenous contrast. Multiplanar CT image reconstructions of the cervical spine and maxillofacial structures were also generated. RADIATION DOSE REDUCTION: This exam was performed according to the departmental dose-optimization program which includes automated exposure control, adjustment of the mA and/or kV according to patient size and/or use of iterative reconstruction technique. COMPARISON:  CT head 05/11/2021. FINDINGS: CT HEAD FINDINGS Brain: No acute intracranial hemorrhage. No CT evidence of acute infarct. Nonspecific hypoattenuation in the periventricular and subcortical white matter favored to reflect chronic microvascular ischemic changes. Small remote infarct in the left corona radiata. No edema, mass effect, or midline shift. The basilar cisterns are patent. Ventricles: The ventricles are normal. Vascular: Atherosclerotic calcifications of the carotid siphons. No hyperdense vessel. Skull: No acute or aggressive finding. Other: Mastoid air cells are clear. CT MAXILLOFACIAL FINDINGS Osseous: Maxilla: Intact. Multiple chronically absent maxillary teeth with associated chronic bone loss. Periapical lucencies of multiple remaining maxillary teeth. Dental caries. Pterygoid Plates: Intact Zygomatic Arch: Intact Orbits: Intact Ethmoid: Intact Sphenoid: Intact Frontal:Intact Mandible:  Intact. No condylar dislocation. Multiple chronically absent mandibular teeth. Dental caries and periapical lucencies. Nasal: Intact Nasal Septum: Intact.  Leftward deviation of the nasal septum. Orbits: Globes are intact. Lenses are normally located. The extraocular muscles and optic nerve sheath complexes are unremarkable. Normal appearance of the retro bulbar fat. No significant preseptal soft tissue swelling. Sinuses: Mild mucosal thickening in the bilateral ethmoid sinuses and in the alveolar recesses of the bilateral maxillary sinuses. No air-fluid levels. Soft tissues: No acute findings of the visualized facial soft tissues. CT CERVICAL SPINE FINDINGS Alignment: Slight reversal of the normal cervical lordosis. No listhesis. No facet subluxation or dislocation. Skull base and vertebrae: No acute fracture. No primary bone lesion or focal pathologic process. Soft tissues and spinal canal: No prevertebral fluid or swelling. No visible canal hematoma. Disc levels: Intervertebral disc spaces are relatively  maintained. Small disc bulges at multiple levels in the cervical spine. Mild spinal canal stenosis at C2-3. Mild-to-moderate spinal canal stenosis at C3-4 and C4-5. Facet arthrosis at multiple levels. No high-grade osseous foraminal stenosis. Upper chest: Negative. Other: None. IMPRESSION: No CT evidence of acute intracranial abnormality. No acute maxillofacial fracture. No acute fracture or traumatic malalignment of the cervical spine. Chronic microvascular ischemic changes. Small remote infarct in the left corona radiata. Degenerative changes of the cervical spine as above. Dental caries and periapical lucencies noted. Electronically Signed   By: Denny Flack M.D.   On: 09/13/2023 21:19   CT Maxillofacial Wo Contrast Result Date: 09/13/2023 CLINICAL DATA:  Trauma, fall from truck with head and facial injury. Evaluation for hemorrhage in facial fractures. EXAM: CT HEAD WITHOUT CONTRAST CT MAXILLOFACIAL  WITHOUT CONTRAST CT CERVICAL SPINE WITHOUT CONTRAST TECHNIQUE: Multidetector CT imaging of the head, cervical spine, and maxillofacial structures were performed using the standard protocol without intravenous contrast. Multiplanar CT image reconstructions of the cervical spine and maxillofacial structures were also generated. RADIATION DOSE REDUCTION: This exam was performed according to the departmental dose-optimization program which includes automated exposure control, adjustment of the mA and/or kV according to patient size and/or use of iterative reconstruction technique. COMPARISON:  CT head 05/11/2021. FINDINGS: CT HEAD FINDINGS Brain: No acute intracranial hemorrhage. No CT evidence of acute infarct. Nonspecific hypoattenuation in the periventricular and subcortical white matter favored to reflect chronic microvascular ischemic changes. Small remote infarct in the left corona radiata. No edema, mass effect, or midline shift. The basilar cisterns are patent. Ventricles: The ventricles are normal. Vascular: Atherosclerotic calcifications of the carotid siphons. No hyperdense vessel. Skull: No acute or aggressive finding. Other: Mastoid air cells are clear. CT MAXILLOFACIAL FINDINGS Osseous: Maxilla: Intact. Multiple chronically absent maxillary teeth with associated chronic bone loss. Periapical lucencies of multiple remaining maxillary teeth. Dental caries. Pterygoid Plates: Intact Zygomatic Arch: Intact Orbits: Intact Ethmoid: Intact Sphenoid: Intact Frontal:Intact Mandible: Intact. No condylar dislocation. Multiple chronically absent mandibular teeth. Dental caries and periapical lucencies. Nasal: Intact Nasal Septum: Intact.  Leftward deviation of the nasal septum. Orbits: Globes are intact. Lenses are normally located. The extraocular muscles and optic nerve sheath complexes are unremarkable. Normal appearance of the retro bulbar fat. No significant preseptal soft tissue swelling. Sinuses: Mild mucosal  thickening in the bilateral ethmoid sinuses and in the alveolar recesses of the bilateral maxillary sinuses. No air-fluid levels. Soft tissues: No acute findings of the visualized facial soft tissues. CT CERVICAL SPINE FINDINGS Alignment: Slight reversal of the normal cervical lordosis. No listhesis. No facet subluxation or dislocation. Skull base and vertebrae: No acute fracture. No primary bone lesion or focal pathologic process. Soft tissues and spinal canal: No prevertebral fluid or swelling. No visible canal hematoma. Disc levels: Intervertebral disc spaces are relatively maintained. Small disc bulges at multiple levels in the cervical spine. Mild spinal canal stenosis at C2-3. Mild-to-moderate spinal canal stenosis at C3-4 and C4-5. Facet arthrosis at multiple levels. No high-grade osseous foraminal stenosis. Upper chest: Negative. Other: None. IMPRESSION: No CT evidence of acute intracranial abnormality. No acute maxillofacial fracture. No acute fracture or traumatic malalignment of the cervical spine. Chronic microvascular ischemic changes. Small remote infarct in the left corona radiata. Degenerative changes of the cervical spine as above. Dental caries and periapical lucencies noted. Electronically Signed   By: Denny Flack M.D.   On: 09/13/2023 21:19   CT Cervical Spine Wo Contrast Result Date: 09/13/2023 CLINICAL DATA:  Trauma, fall from truck  with head and facial injury. Evaluation for hemorrhage in facial fractures. EXAM: CT HEAD WITHOUT CONTRAST CT MAXILLOFACIAL WITHOUT CONTRAST CT CERVICAL SPINE WITHOUT CONTRAST TECHNIQUE: Multidetector CT imaging of the head, cervical spine, and maxillofacial structures were performed using the standard protocol without intravenous contrast. Multiplanar CT image reconstructions of the cervical spine and maxillofacial structures were also generated. RADIATION DOSE REDUCTION: This exam was performed according to the departmental dose-optimization program which  includes automated exposure control, adjustment of the mA and/or kV according to patient size and/or use of iterative reconstruction technique. COMPARISON:  CT head 05/11/2021. FINDINGS: CT HEAD FINDINGS Brain: No acute intracranial hemorrhage. No CT evidence of acute infarct. Nonspecific hypoattenuation in the periventricular and subcortical white matter favored to reflect chronic microvascular ischemic changes. Small remote infarct in the left corona radiata. No edema, mass effect, or midline shift. The basilar cisterns are patent. Ventricles: The ventricles are normal. Vascular: Atherosclerotic calcifications of the carotid siphons. No hyperdense vessel. Skull: No acute or aggressive finding. Other: Mastoid air cells are clear. CT MAXILLOFACIAL FINDINGS Osseous: Maxilla: Intact. Multiple chronically absent maxillary teeth with associated chronic bone loss. Periapical lucencies of multiple remaining maxillary teeth. Dental caries. Pterygoid Plates: Intact Zygomatic Arch: Intact Orbits: Intact Ethmoid: Intact Sphenoid: Intact Frontal:Intact Mandible: Intact. No condylar dislocation. Multiple chronically absent mandibular teeth. Dental caries and periapical lucencies. Nasal: Intact Nasal Septum: Intact.  Leftward deviation of the nasal septum. Orbits: Globes are intact. Lenses are normally located. The extraocular muscles and optic nerve sheath complexes are unremarkable. Normal appearance of the retro bulbar fat. No significant preseptal soft tissue swelling. Sinuses: Mild mucosal thickening in the bilateral ethmoid sinuses and in the alveolar recesses of the bilateral maxillary sinuses. No air-fluid levels. Soft tissues: No acute findings of the visualized facial soft tissues. CT CERVICAL SPINE FINDINGS Alignment: Slight reversal of the normal cervical lordosis. No listhesis. No facet subluxation or dislocation. Skull base and vertebrae: No acute fracture. No primary bone lesion or focal pathologic process. Soft  tissues and spinal canal: No prevertebral fluid or swelling. No visible canal hematoma. Disc levels: Intervertebral disc spaces are relatively maintained. Small disc bulges at multiple levels in the cervical spine. Mild spinal canal stenosis at C2-3. Mild-to-moderate spinal canal stenosis at C3-4 and C4-5. Facet arthrosis at multiple levels. No high-grade osseous foraminal stenosis. Upper chest: Negative. Other: None. IMPRESSION: No CT evidence of acute intracranial abnormality. No acute maxillofacial fracture. No acute fracture or traumatic malalignment of the cervical spine. Chronic microvascular ischemic changes. Small remote infarct in the left corona radiata. Degenerative changes of the cervical spine as above. Dental caries and periapical lucencies noted. Electronically Signed   By: Denny Flack M.D.   On: 09/13/2023 21:19     Data Reviewed: Relevant notes from primary care and specialist visits, past discharge summaries as available in EHR, including Care Everywhere. Prior diagnostic testing as pertinent to current admission diagnoses Updated medications and problem lists for reconciliation ED course, including vitals, labs, imaging, treatment and response to treatment Triage notes, nursing and pharmacy notes and ED provider's notes Notable results as noted in HPI   Assessment and Plan: * Acute CVA (cerebrovascular accident) (HCC) Ataxic gait Last known well uncertain, symptoms for a week then fell several hours prior to arrival outside tPA window Permissive hypertension for first 24-48 hrs post stroke onset: Prn Labetalol  IV or Vasotec IV If BP greater than 220/120  Statins for LDL goal less than 70 ASA 81mg  daily, Plavix  75mg  daily x 3 weeks  then monotherapy thereafter Telemetry, echo Avoid dextrose  containing fluids, Maintain euglycemia, euthermia Neuro checks q4 hrs x 24 hrs and then per shift Head of bed 30 degrees Physical therapy/Occupational therapy/Speech therapy if failed  dysphagia screen Neurology consult to follow   Syncope and collapse Possibly secondary to CVA but given history of seizure disorder we will also get EKG Continuous cardiac monitoring Neurologic checks with fall and aspiration precautions  Seizure disorder [facility]) Patient states he has not taken his medication in about a year Will get Keppra  level Resume Keppra  twice daily Follow-up EEG  Facial abrasions, initial encounter Wound care Verify tetanus shot status Pain control  ESRD on dialysis (HCC) Anemia of ESRD Nephrology consult for continuation of dialysis Hemoglobin at baseline  Essential hypertension Hold home meds for permissive hypertension  Diabetes mellitus, type II (HCC) Continue basal insulin  Sliding scale coverage     DVT prophylaxis: Lovenox  Consults: Neurology and nephrology  Advance Care Planning:   Code Status: Prior   Family Communication: Girlfriend at bedside  Disposition Plan: Back to previous home environment  Severity of Illness: The appropriate patient status for this patient is OBSERVATION. Observation status is judged to be reasonable and necessary in order to provide the required intensity of service to ensure the patient's safety. The patient's presenting symptoms, physical exam findings, and initial radiographic and laboratory data in the context of their medical condition is felt to place them at decreased risk for further clinical deterioration. Furthermore, it is anticipated that the patient will be medically stable for discharge from the hospital within 2 midnights of admission.   Author: Lanetta Pion, MD 09/14/2023 1:42 AM  For on call review [email or web address].

## 2023-09-14 NOTE — Evaluation (Signed)
 Clinical/Bedside Swallow Evaluation Patient Details  Name: Chase Rodriguez MRN: [id number] Date of Birth: Apr 11, 1961  Today's Date: 09/14/2023 Time: SLP Start Time (ACUTE ONLY): 0840 SLP Stop Time (ACUTE ONLY): 0854 SLP Time Calculation (min) (ACUTE ONLY): 14 min  Past Medical History:  Past Medical History:  Diagnosis Date   Diabetes mellitus without complication (HCC)    Hepatitis    High cholesterol    History of kidney stones    Hypertension    Stage 3b chronic kidney disease (CKD) (HCC)    Past Surgical History:  Past Surgical History:  Procedure Laterality Date   AV FISTULA PLACEMENT Left 06/14/2022   Procedure: ARTERIOVENOUS (AV) FISTULA CREATION (BRACHIAL CEPHALIC);  Surgeon: Celso College, MD;  Location: [facility] ORS;  Service: Vascular;  Laterality: Left;   TONSILLECTOMY     HPI:  Chase Rodriguez is a 63 y.o. male with medical history significant for diabetes mellitu ESRD on dialysis MWF, hypertension, seizure disorder, tobacco use disorder, being admitted with an acute stroke.  His initial presentation was for a fall after he fell over from a sitting position while sitting in his truck tailgate, sustaining multiple abrasions to the midline of his face.  He does not recall the circumstances of him falling over and is unable to say whether he lost consciousness.  Patient received dialysis earlier in the day and per ED provider who got collateral information from his partner, his blood pressure was low following dialysis.  After an initial reassuring workup in the ED, they stood him up to ambulate however he was very ataxic and further diagnostic evaluation was initiated to evaluate for stroke.  Patient admits to not taking his seizure medicine about a year. 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 Upon review of MRI- edited results report,  'no abnormal diffusion or acute infarct is identified. There are chronic appearing lacunar infarcts in the  bilateral deep gray nuclei, bilateral pons. Some associated Wallerian degeneration. No evidence of cerebral edema. Major intracranial vascular flow. voids are preserved. 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 And there is no intracranial mass effect or ventriculomegaly.    Assessment / Plan / Recommendation  Clinical Impression  Pt seen for bedside swallow assessment in the setting of acute CVA. Pt with multiple facial abrasions, though pt denied pain. Oral motor exam revealing grossly functional movement/sensation. Oral care provided revealing some missing dentition. Pt seen with trials of thin liquids (via straw), puree, and regular solids. No overt or subtle s/sx pharyngeal dysphagia noted. No change to vocal quality across trials. Oral phase grossly intact- with complete manipulation and clearance of regular solid from oral cavity. Pt denied history of PNA, GERD, or dysphagia.   Based on fall/?neuro injury and current deconditioning, pt is at increased risk of aspiration, therefore recommend aspiration precautions (slow rate, small bites, elevated HOB, and alert for PO intake). Recommend regular solids and thin liquids.   Regarding speech and language- pt demonstrating intact/independent expressive/receptive language, orientation, and attention for completion of tasks. Pt and significant other denied acute change in cognitive ability. OT reporting no overt cognitive deficits during functional tasks. No futher acute SLP services indicated. SLP Visit Diagnosis: Dysphagia, unspecified (R13.10)    Aspiration Risk  Mild aspiration risk    Diet Recommendation   Age appropriate regular;Thin  Medication Administration: Whole meds with liquid    Other  Recommendations Oral Care Recommendations: Oral care BID  Recommendations for follow up therapy are one component of a multi-disciplinary discharge planning process, led by  the attending physician.  Recommendations may be updated based on patient status, additional functional criteria and insurance authorization.  Follow up Recommendations No SLP follow up      Assistance Recommended at Discharge    Functional Status Assessment Patient has not had a recent decline in their functional status  Frequency and Duration            Prognosis        Swallow Study   General Date of Onset: 09/14/23 HPI: Chase Rodriguez is a 63 y.o. male with medical history significant for diabetes mellitu ESRD on dialysis MWF, hypertension, seizure disorder, tobacco use disorder, being admitted with an acute stroke.  His initial presentation was for a fall after he fell over from a sitting position while sitting in his truck tailgate, sustaining multiple abrasions to the midline of his face.  He does not recall the circumstances of him falling over and is unable to say whether he lost consciousness.  Patient received dialysis earlier in the day and per ED provider who got collateral information from his partner, his blood pressure was low following dialysis.  After an initial reassuring workup in the ED, they stood him up to ambulate however he was very ataxic and further diagnostic evaluation was initiated to evaluate for stroke.  Patient admits to not taking his seizure medicine about a year. 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 Upon review of MRI- edited results report,  'no abnormal diffusion or acute infarct is identified. There are chronic appearing lacunar infarcts in the bilateral deep gray nuclei, bilateral pons. Some associated Wallerian degeneration. No evidence of cerebral edema. Major intracranial vascular flow. voids are preserved. 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 And there is no intracranial mass effect or ventriculomegaly. Type of Study: Bedside  Swallow Evaluation Previous Swallow Assessment: none in chart Diet Prior to this Study: NPO Temperature Spikes Noted: No Respiratory Status: Room air (WBC 9.5) History of Recent Intubation: No Behavior/Cognition: Alert;Cooperative Oral Cavity Assessment: Within Functional Limits Oral Care Completed by SLP: Yes Oral Cavity - Dentition: Missing dentition Vision: Functional for self-feeding Self-Feeding Abilities: Able to feed self Patient Positioning: Upright in bed Baseline Vocal Quality: Normal Volitional Cough: Strong Volitional Swallow: Able to elicit    Oral/Motor/Sensory Function Overall Oral Motor/Sensory Function: Within functional limits   Ice Chips Ice chips: Within functional limits   Thin Liquid Thin Liquid: Within functional limits Presentation: Cup;Straw;Self Fed    Nectar Thick Nectar Thick Liquid: Not tested   Honey Thick Honey Thick Liquid: Not tested   Puree Puree: Within functional limits Presentation: Self Fed;Spoon   Solid     Solid: Within functional limits Presentation: Self Fed     [place] Alaney Witter Clapp, MS, CCC-SLP Speech Language Pathologist Rehab Services; [facility] - [place] [phone] (ascom)   [place] J Clapp 09/14/2023,10:28 AM

## 2023-09-14 NOTE — Progress Notes (Signed)
*  PRELIMINARY RESULTS* Echocardiogram 2D Echocardiogram has been performed.  Chase Rodriguez 09/14/2023, 12:44 PM

## 2023-09-14 NOTE — Assessment & Plan Note (Signed)
 Continue basal insulin Sliding scale coverage

## 2023-09-14 NOTE — Discharge Summary (Signed)
 Physician Discharge Summary  Chase Rodriguez FAO:[id number] DOB: 1961-04-14 DOA: 09/13/2023  PCP: [name]  Admit date: 09/13/2023 Discharge date: 09/14/2023  Admitted From: Home Disposition:  Home  Recommendations for Outpatient Follow-up:  Follow up with PCP in 1-2 weeks   Home Health:No Equipment/Devices:None  Discharge Condition:Stable  CODE STATUS:FULL  Diet recommendation: Reg  Brief/Interim Summary: 63 y.o. male with medical history significant for diabetes mellitu ESRD on dialysis MWF, hypertension, seizure disorder, tobacco use disorder, being admitted with an acute stroke.  His initial presentation was for a fall after he fell over from a sitting position while sitting in his truck tailgate, sustaining multiple abrasions to the midline of his face.  He does not recall the circumstances of him falling over and is unable to say whether he lost consciousness.  Patient received dialysis earlier in the day and per ED provider who got collateral information from his partner, his blood pressure was low following dialysis.  After an initial reassuring workup in the ED, they stood him up to ambulate however he was very ataxic and further diagnostic evaluation was initiated to evaluate for stroke.  Patient admits to not taking his seizure medicine about a year   MRI initially read as MCA stroke however upon further review by neurology as determined the patient did not have a stroke and none was noted on imaging.  Suspect breakthrough seizures in setting of nonadherence to prescribed Keppra .  Patient seen and evaluated by neurology.  Cleared for discharge home.  Resume Keppra  500 mg p.o. twice daily.  Prescription sent to outpatient pharmacy.    Discharge Diagnoses:  Principal Problem:   Acute CVA (cerebrovascular accident) (HCC) Active Problems:   Ataxia   Seizure disorder (HCC)   Syncope and collapse   Facial abrasions, initial encounter   Anemia of chronic  kidney failure, stage 5 (HCC)   ESRD on dialysis (HCC)   Essential hypertension   Diabetes mellitus, type II (HCC)   * Acute CVA (cerebrovascular accident) (HCC) Ataxic gait Suspected breakthrough seizures  Acute CVA initial presentation.  Ruled out.  No evidence of stroke on imaging.  No indication for Po stroke evaluation.  Suspect breakthrough seizure secondary to nonadherence to Keppra .  Discharge home.  Resume home Keppra  500 mg p.o. twice daily.  Follow-up outpatient PCP.   Discharge Instructions  Discharge Instructions     Diet - low sodium heart healthy   Complete by: As directed    Increase activity slowly   Complete by: As directed       Allergies as of 09/14/2023   No Known Allergies      Medication List     STOP taking these medications    fenofibrate  160 MG tablet   labetalol  300 MG tablet Commonly known as: NORMODYNE    Rightest GS550 Blood Glucose test strip Generic drug: glucose blood       TAKE these medications    amLODipine  10 MG tablet Commonly known as: NORVASC  Take 1 tablet (10 mg total) by mouth once daily.   atorvastatin  20 MG tablet Commonly known as: LIPITOR Take 1 tablet (20 mg total) by mouth once daily.   BD Veo Insulin  Syringe U/F 31G X 15/64" 0.5 ML Misc Generic drug: Insulin  Syringe-Needle U-100 USE AS DIRECTED   calcitRIOL  0.25 MCG capsule Commonly known as: ROCALTROL  Take 0.25 mcg by mouth 3 (three) times a week.   chlorthalidone  25 MG tablet Commonly known as: HYGROTON  Take 25 mg by mouth daily.  FeroSul 325 (65 Fe) MG tablet Generic drug: ferrous sulfate  Take 325 mg by mouth 2 (two) times daily with a meal.   isosorbide  mononitrate 30 MG 24 hr tablet Commonly known as: IMDUR  Take 30 mg by mouth daily.   Jardiance 25 MG Tabs tablet Generic drug: empagliflozin Take by mouth daily.   Levemir  100 UNIT/ML injection Generic drug: insulin  detemir Inject 0.2 mLs (20 Units total) into the skin 2 (two) times  daily.   levETIRAcetam  500 MG tablet Commonly known as: KEPPRA  Take 1 tablet (500 mg total) by mouth 2 (two) times daily.   nicotine  21 mg/24hr patch Commonly known as: NICODERM CQ  - dosed in mg/24 hours Place 21 mg onto the skin daily.   Rightest GL300 Lancets Misc Use as directed to test blood sugar   Rightest GM550 Blood Glucose w/Device Kit Use as directed to check blood sugar   sodium bicarbonate  650 MG tablet Take 650 mg by mouth 2 (two) times daily.        No Known Allergies  Consultations: Neurology   Procedures/Studies: MR BRAIN WO CONTRAST Addendum Date: 09/14/2023 ADDENDUM REPORT: 09/14/2023 09:36 ADDENDUM: Study discussed by telephone with Dr. Classie Cruise on 09/14/2023 at 09:32 . On review of this exam no abnormal diffusion or acute infarct is identified. There are chronic appearing lacunar infarcts in the bilateral deep gray nuclei, bilateral pons. Some associated Wallerian degeneration. No evidence of cerebral edema. Major intracranial vascular flow voids are preserved. 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 And there is no intracranial mass effect or ventriculomegaly. Salient findings of the above were discussed wtih Dr. Classie Cruise. Electronically Signed   By: Marlise Simpers M.D.   On: 09/14/2023 09:36   Result Date: 09/14/2023 CLINICAL DATA:  ataxia, ongoing for a while, concern for CVA EXAM: MRI HEAD WITHOUT CONTRAST TECHNIQUE: Multiplanar, multiecho pulse sequences of the brain and surrounding structures were obtained without intravenous contrast. COMPARISON:  Same day CT head. FINDINGS: Brain: Many acute left parietal infarcts. Associated edema without mass effect. No evidence of acute hemorrhage, mass lesion, midline shift or hydrocephalus. Vascular: Major arterial flow voids are maintained at the skull base. Skull and upper cervical spine: Normal marrow signal. Sinuses/Orbits: Mostly  clear sinuses.  No acute orbital findings. Other: No mastoid effusions. IMPRESSION: Multiple acute left parietal infarcts (posterior left MCA territory). Electronically Signed: By: Stevenson Elbe M.D. On: 09/14/2023 00:14   CT Head Wo Contrast Result Date: 09/13/2023 CLINICAL DATA:  Trauma, fall from truck with head and facial injury. Evaluation for hemorrhage in facial fractures. EXAM: CT HEAD WITHOUT CONTRAST CT MAXILLOFACIAL WITHOUT CONTRAST CT CERVICAL SPINE WITHOUT CONTRAST TECHNIQUE: Multidetector CT imaging of the head, cervical spine, and maxillofacial structures were performed using the standard protocol without intravenous contrast. Multiplanar CT image reconstructions of the cervical spine and maxillofacial structures were also generated. RADIATION DOSE REDUCTION: This exam was performed according to the departmental dose-optimization program which includes automated exposure control, adjustment of the mA and/or kV according to patient size and/or use of iterative reconstruction technique. COMPARISON:  CT head 05/11/2021. FINDINGS: CT HEAD FINDINGS Brain: No acute intracranial hemorrhage. No CT evidence of acute infarct. Nonspecific hypoattenuation in the periventricular and subcortical white matter favored to reflect chronic microvascular ischemic changes. Small remote infarct in the left corona radiata. No edema, mass effect, or midline shift. The basilar cisterns are patent. Ventricles: The ventricles are normal. Vascular: Atherosclerotic calcifications of the carotid siphons.  No hyperdense vessel. Skull: No acute or aggressive finding. Other: Mastoid air cells are clear. CT MAXILLOFACIAL FINDINGS Osseous: Maxilla: Intact. Multiple chronically absent maxillary teeth with associated chronic bone loss. Periapical lucencies of multiple remaining maxillary teeth. Dental caries. Pterygoid Plates: Intact Zygomatic Arch: Intact Orbits: Intact Ethmoid: Intact Sphenoid: Intact Frontal:Intact Mandible:  Intact. No condylar dislocation. Multiple chronically absent mandibular teeth. Dental caries and periapical lucencies. Nasal: Intact Nasal Septum: Intact.  Leftward deviation of the nasal septum. Orbits: Globes are intact. Lenses are normally located. The extraocular muscles and optic nerve sheath complexes are unremarkable. Normal appearance of the retro bulbar fat. No significant preseptal soft tissue swelling. Sinuses: Mild mucosal thickening in the bilateral ethmoid sinuses and in the alveolar recesses of the bilateral maxillary sinuses. No air-fluid levels. Soft tissues: No acute findings of the visualized facial soft tissues. CT CERVICAL SPINE FINDINGS Alignment: Slight reversal of the normal cervical lordosis. No listhesis. No facet subluxation or dislocation. Skull base and vertebrae: No acute fracture. No primary bone lesion or focal pathologic process. Soft tissues and spinal canal: No prevertebral fluid or swelling. No visible canal hematoma. Disc levels: Intervertebral disc spaces are relatively maintained. Small disc bulges at multiple levels in the cervical spine. Mild spinal canal stenosis at C2-3. Mild-to-moderate spinal canal stenosis at C3-4 and C4-5. Facet arthrosis at multiple levels. No high-grade osseous foraminal stenosis. Upper chest: Negative. Other: None. IMPRESSION: No CT evidence of acute intracranial abnormality. No acute maxillofacial fracture. No acute fracture or traumatic malalignment of the cervical spine. Chronic microvascular ischemic changes. Small remote infarct in the left corona radiata. Degenerative changes of the cervical spine as above. Dental caries and periapical lucencies noted. Electronically Signed   By: Denny Flack M.D.   On: 09/13/2023 21:19   CT Maxillofacial Wo Contrast Result Date: 09/13/2023 CLINICAL DATA:  Trauma, fall from truck with head and facial injury. Evaluation for hemorrhage in facial fractures. EXAM: CT HEAD WITHOUT CONTRAST CT MAXILLOFACIAL  WITHOUT CONTRAST CT CERVICAL SPINE WITHOUT CONTRAST TECHNIQUE: Multidetector CT imaging of the head, cervical spine, and maxillofacial structures were performed using the standard protocol without intravenous contrast. Multiplanar CT image reconstructions of the cervical spine and maxillofacial structures were also generated. RADIATION DOSE REDUCTION: This exam was performed according to the departmental dose-optimization program which includes automated exposure control, adjustment of the mA and/or kV according to patient size and/or use of iterative reconstruction technique. COMPARISON:  CT head 05/11/2021. FINDINGS: CT HEAD FINDINGS Brain: No acute intracranial hemorrhage. No CT evidence of acute infarct. Nonspecific hypoattenuation in the periventricular and subcortical white matter favored to reflect chronic microvascular ischemic changes. Small remote infarct in the left corona radiata. No edema, mass effect, or midline shift. The basilar cisterns are patent. Ventricles: The ventricles are normal. Vascular: Atherosclerotic calcifications of the carotid siphons. No hyperdense vessel. Skull: No acute or aggressive finding. Other: Mastoid air cells are clear. CT MAXILLOFACIAL FINDINGS Osseous: Maxilla: Intact. Multiple chronically absent maxillary teeth with associated chronic bone loss. Periapical lucencies of multiple remaining maxillary teeth. Dental caries. Pterygoid Plates: Intact Zygomatic Arch: Intact Orbits: Intact Ethmoid: Intact Sphenoid: Intact Frontal:Intact Mandible: Intact. No condylar dislocation. Multiple chronically absent mandibular teeth. Dental caries and periapical lucencies. Nasal: Intact Nasal Septum: Intact.  Leftward deviation of the nasal septum. Orbits: Globes are intact. Lenses are normally located. The extraocular muscles and optic nerve sheath complexes are unremarkable. Normal appearance of the retro bulbar fat. No significant preseptal soft tissue swelling. Sinuses: Mild mucosal  thickening in the bilateral  ethmoid sinuses and in the alveolar recesses of the bilateral maxillary sinuses. No air-fluid levels. Soft tissues: No acute findings of the visualized facial soft tissues. CT CERVICAL SPINE FINDINGS Alignment: Slight reversal of the normal cervical lordosis. No listhesis. No facet subluxation or dislocation. Skull base and vertebrae: No acute fracture. No primary bone lesion or focal pathologic process. Soft tissues and spinal canal: No prevertebral fluid or swelling. No visible canal hematoma. Disc levels: Intervertebral disc spaces are relatively maintained. Small disc bulges at multiple levels in the cervical spine. Mild spinal canal stenosis at C2-3. Mild-to-moderate spinal canal stenosis at C3-4 and C4-5. Facet arthrosis at multiple levels. No high-grade osseous foraminal stenosis. Upper chest: Negative. Other: None. IMPRESSION: No CT evidence of acute intracranial abnormality. No acute maxillofacial fracture. No acute fracture or traumatic malalignment of the cervical spine. Chronic microvascular ischemic changes. Small remote infarct in the left corona radiata. Degenerative changes of the cervical spine as above. Dental caries and periapical lucencies noted. Electronically Signed   By: Denny Flack M.D.   On: 09/13/2023 21:19   CT Cervical Spine Wo Contrast Result Date: 09/13/2023 CLINICAL DATA:  Trauma, fall from truck with head and facial injury. Evaluation for hemorrhage in facial fractures. EXAM: CT HEAD WITHOUT CONTRAST CT MAXILLOFACIAL WITHOUT CONTRAST CT CERVICAL SPINE WITHOUT CONTRAST TECHNIQUE: Multidetector CT imaging of the head, cervical spine, and maxillofacial structures were performed using the standard protocol without intravenous contrast. Multiplanar CT image reconstructions of the cervical spine and maxillofacial structures were also generated. RADIATION DOSE REDUCTION: This exam was performed according to the departmental dose-optimization program which  includes automated exposure control, adjustment of the mA and/or kV according to patient size and/or use of iterative reconstruction technique. COMPARISON:  CT head 05/11/2021. FINDINGS: CT HEAD FINDINGS Brain: No acute intracranial hemorrhage. No CT evidence of acute infarct. Nonspecific hypoattenuation in the periventricular and subcortical white matter favored to reflect chronic microvascular ischemic changes. Small remote infarct in the left corona radiata. No edema, mass effect, or midline shift. The basilar cisterns are patent. Ventricles: The ventricles are normal. Vascular: Atherosclerotic calcifications of the carotid siphons. No hyperdense vessel. Skull: No acute or aggressive finding. Other: Mastoid air cells are clear. CT MAXILLOFACIAL FINDINGS Osseous: Maxilla: Intact. Multiple chronically absent maxillary teeth with associated chronic bone loss. Periapical lucencies of multiple remaining maxillary teeth. Dental caries. Pterygoid Plates: Intact Zygomatic Arch: Intact Orbits: Intact Ethmoid: Intact Sphenoid: Intact Frontal:Intact Mandible: Intact. No condylar dislocation. Multiple chronically absent mandibular teeth. Dental caries and periapical lucencies. Nasal: Intact Nasal Septum: Intact.  Leftward deviation of the nasal septum. Orbits: Globes are intact. Lenses are normally located. The extraocular muscles and optic nerve sheath complexes are unremarkable. Normal appearance of the retro bulbar fat. No significant preseptal soft tissue swelling. Sinuses: Mild mucosal thickening in the bilateral ethmoid sinuses and in the alveolar recesses of the bilateral maxillary sinuses. No air-fluid levels. Soft tissues: No acute findings of the visualized facial soft tissues. CT CERVICAL SPINE FINDINGS Alignment: Slight reversal of the normal cervical lordosis. No listhesis. No facet subluxation or dislocation. Skull base and vertebrae: No acute fracture. No primary bone lesion or focal pathologic process. Soft  tissues and spinal canal: No prevertebral fluid or swelling. No visible canal hematoma. Disc levels: Intervertebral disc spaces are relatively maintained. Small disc bulges at multiple levels in the cervical spine. Mild spinal canal stenosis at C2-3. Mild-to-moderate spinal canal stenosis at C3-4 and C4-5. Facet arthrosis at multiple levels. No high-grade osseous foraminal stenosis. Upper chest:  Negative. Other: None. IMPRESSION: No CT evidence of acute intracranial abnormality. No acute maxillofacial fracture. No acute fracture or traumatic malalignment of the cervical spine. Chronic microvascular ischemic changes. Small remote infarct in the left corona radiata. Degenerative changes of the cervical spine as above. Dental caries and periapical lucencies noted. Electronically Signed   By: Denny Flack M.D.   On: 09/13/2023 21:19      Subjective: Seen and examined on the day of discharge.  Stable no distress.  Appropriate discharge home.  Discharge Exam: Vitals:   09/14/23 0533 09/14/23 0819  BP: (!) 143/67 134/65  Pulse: 76 79  Resp: 18 18  Temp: 98.5 F (36.9 C) 98.2 F (36.8 C)  SpO2: 98% 99%   Vitals:   09/14/23 0230 09/14/23 0312 09/14/23 0533 09/14/23 0819  BP: 135/73 (!) 120/96 (!) 143/67 134/65  Pulse: 74 87 76 79  Resp: 16 19 18 18   Temp: (!) 97.4 F (36.3 C) 98.6 F (37 C) 98.5 F (36.9 C) 98.2 F (36.8 C)  TempSrc: Oral Oral Oral   SpO2: 95% 100% 98% 99%  Weight:      Height:        General: Pt is alert, awake, not in acute distress Cardiovascular: RRR, S1/S2 +, no rubs, no gallops Respiratory: CTA bilaterally, no wheezing, no rhonchi Abdominal: Soft, NT, ND, bowel sounds + Extremities: no edema, no cyanosis    The results of significant diagnostics from this hospitalization (including imaging, microbiology, ancillary and laboratory) are listed below for reference.     Microbiology: No results found for this or any previous visit (from the past 240 hours).    Labs: BNP (last 3 results) No results for input(s): "BNP" in the last 8760 hours. Basic Metabolic Panel: Recent Labs  Lab 09/13/23 2005  NA 139  K 2.9*  CL 92*  CO2 31  GLUCOSE 167*  BUN 15  CREATININE 4.70*  CALCIUM  9.9   Liver Function Tests: No results for input(s): "AST", "ALT", "ALKPHOS", "BILITOT", "PROT", "ALBUMIN" in the last 168 hours. No results for input(s): "LIPASE", "AMYLASE" in the last 168 hours. No results for input(s): "AMMONIA" in the last 168 hours. CBC: Recent Labs  Lab 09/13/23 2005  WBC 9.5  NEUTROABS 7.4  HGB 10.7*  HCT 33.8*  MCV 84.3  PLT 263   Cardiac Enzymes: No results for input(s): "CKTOTAL", "CKMB", "CKMBINDEX", "TROPONINI" in the last 168 hours. BNP: Invalid input(s): "POCBNP" CBG: Recent Labs  Lab 09/14/23 0227 09/14/23 0820  GLUCAP 122* 99   D-Dimer No results for input(s): "DDIMER" in the last 72 hours. Hgb A1c Recent Labs    09/14/23 0437  HGBA1C 5.1   Lipid Profile Recent Labs    09/14/23 0437  CHOL 189  HDL 36*  LDLCALC 126*  TRIG 135  CHOLHDL 5.3   Thyroid function studies No results for input(s): "TSH", "T4TOTAL", "T3FREE", "THYROIDAB" in the last 72 hours.  Invalid input(s): "FREET3" Anemia work up No results for input(s): "VITAMINB12", "FOLATE", "FERRITIN", "TIBC", "IRON", "RETICCTPCT" in the last 72 hours. Urinalysis    Component Value Date/Time   COLORURINE YELLOW (A) 12/27/2021 1558   APPEARANCEUR HAZY (A) 12/27/2021 1558   LABSPEC 1.014 12/27/2021 1558   PHURINE 5.0 12/27/2021 1558   GLUCOSEU 150 (A) 12/27/2021 1558   HGBUR SMALL (A) 12/27/2021 1558   BILIRUBINUR NEGATIVE 12/27/2021 1558   KETONESUR NEGATIVE 12/27/2021 1558   PROTEINUR >=300 (A) 12/27/2021 1558   NITRITE NEGATIVE 12/27/2021 1558   LEUKOCYTESUR NEGATIVE 12/27/2021 1558  Sepsis Labs Recent Labs  Lab 09/13/23 2005  WBC 9.5   Microbiology No results found for this or any previous visit (from the past 240  hours).   Time coordinating discharge: Over 30 minutes  SIGNED:   Tiajuana Fluke, MD  [facility] 09/14/2023, 10:59 AM Pager   If 7PM-7AM, please contact night-coverage

## 2023-09-14 NOTE — Assessment & Plan Note (Signed)
 Hold home meds for permissive hypertension

## 2023-09-14 NOTE — Plan of Care (Signed)
 Problem: Education: Goal: Ability to describe self-care measures that may prevent or decrease complications (Diabetes Survival Skills Education) will improve Outcome: Adequate for Discharge Goal: Individualized Educational Video(s) Outcome: Adequate for Discharge   Problem: Coping: Goal: Ability to adjust to condition or change in health will improve Outcome: Adequate for Discharge   Problem: Fluid Volume: Goal: Ability to maintain a balanced intake and output will improve Outcome: Adequate for Discharge   Problem: Health Behavior/Discharge Planning: Goal: Ability to identify and utilize available resources and services will improve Outcome: Adequate for Discharge Goal: Ability to manage health-related needs will improve Outcome: Adequate for Discharge   Problem: Metabolic: Goal: Ability to maintain appropriate glucose levels will improve Outcome: Adequate for Discharge   Problem: Nutritional: Goal: Maintenance of adequate nutrition will improve Outcome: Adequate for Discharge Goal: Progress toward achieving an optimal weight will improve Outcome: Adequate for Discharge   Problem: Skin Integrity: Goal: Risk for impaired skin integrity will decrease Outcome: Adequate for Discharge   Problem: Tissue Perfusion: Goal: Adequacy of tissue perfusion will improve Outcome: Adequate for Discharge   Problem: Education: Goal: Knowledge of disease or condition will improve Outcome: Adequate for Discharge Goal: Knowledge of secondary prevention will improve (MUST DOCUMENT ALL) Outcome: Adequate for Discharge Goal: Knowledge of patient specific risk factors will improve (DELETE if not current risk factor) Outcome: Adequate for Discharge   Problem: Ischemic Stroke/TIA Tissue Perfusion: Goal: Complications of ischemic stroke/TIA will be minimized Outcome: Adequate for Discharge   Problem: Coping: Goal: Will verbalize positive feelings about self Outcome: Adequate for  Discharge Goal: Will identify appropriate support needs Outcome: Adequate for Discharge   Problem: Health Behavior/Discharge Planning: Goal: Ability to manage health-related needs will improve Outcome: Adequate for Discharge Goal: Goals will be collaboratively established with patient/family Outcome: Adequate for Discharge   Problem: Self-Care: Goal: Ability to participate in self-care as condition permits will improve Outcome: Adequate for Discharge Goal: Verbalization of feelings and concerns over difficulty with self-care will improve Outcome: Adequate for Discharge Goal: Ability to communicate needs accurately will improve Outcome: Adequate for Discharge   Problem: Nutrition: Goal: Risk of aspiration will decrease Outcome: Adequate for Discharge Goal: Dietary intake will improve Outcome: Adequate for Discharge   Problem: Education: Goal: Knowledge of General Education information will improve Description: Including pain rating scale, medication(s)/side effects and non-pharmacologic comfort measures Outcome: Adequate for Discharge   Problem: Health Behavior/Discharge Planning: Goal: Ability to manage health-related needs will improve Outcome: Adequate for Discharge   Problem: Clinical Measurements: Goal: Ability to maintain clinical measurements within normal limits will improve Outcome: Adequate for Discharge Goal: Will remain free from infection Outcome: Adequate for Discharge Goal: Diagnostic test results will improve Outcome: Adequate for Discharge Goal: Respiratory complications will improve Outcome: Adequate for Discharge Goal: Cardiovascular complication will be avoided Outcome: Adequate for Discharge   Problem: Activity: Goal: Risk for activity intolerance will decrease Outcome: Adequate for Discharge   Problem: Nutrition: Goal: Adequate nutrition will be maintained Outcome: Adequate for Discharge   Problem: Coping: Goal: Level of anxiety will  decrease Outcome: Adequate for Discharge   Problem: Elimination: Goal: Will not experience complications related to bowel motility Outcome: Adequate for Discharge Goal: Will not experience complications related to urinary retention Outcome: Adequate for Discharge   Problem: Pain Managment: Goal: General experience of comfort will improve and/or be controlled Outcome: Adequate for Discharge   Problem: Safety: Goal: Ability to remain free from injury will improve Outcome: Adequate for Discharge   Problem: Skin Integrity: Goal: Risk for  impaired skin integrity will decrease Outcome: Adequate for Discharge   Problem: Acute Rehab PT Goals(only PT should resolve) Goal: Pt Will Transfer Bed To Chair/Chair To Bed Outcome: Adequate for Discharge Goal: Pt Will Ambulate Outcome: Adequate for Discharge Goal: Pt Will Go Up/Down Stairs Outcome: Adequate for Discharge

## 2023-09-14 NOTE — Hospital Course (Signed)
 Chase Rodriguez

## 2023-09-14 NOTE — Progress Notes (Signed)
 [facility]  ROUNDING NOTE   Subjective:   Chase Rodriguez  is a 63 y.o. male with past medical history of diabetes, hypertension, nicotine  dependence, dyslipidemia, and end stage renal disease on hemodialysis. Patient presents to the ED after a fall. He was admitted overnight for Weakness [R53.1] Injury of head, initial encounter [S09.90XA] Fall, initial encounter [W19.XXXA] Acute CVA (cerebrovascular accident) [facility]) [I63.9] Cerebrovascular accident (CVA), unspecified mechanism (HCC) [I63.9]  Patient is known to our practice and receives outpatient dialysis treatments at [facility] on a MWF schedule.  Patient confirms he did receive a full treatment prior to ED arrival on Friday.  Patient is seen resting in bed, family at bedside.  Multiple abrasions noted on patient's face.  Patient states he did not rest well last night due to discomfort.  Labs on ED arrival unremarkable for renal patient.  Hemoglobin 7.6 with slightly elevated white count.  Brain MRI shows multiple acute left parietal infarcts.  We have been consulted to manage dialysis needs.   Objective:  Vital signs in last 24 hours:  Temp:  [97.4 F (36.3 C)-98.6 F (37 C)] 98.2 F (36.8 C) (04/19 0819) Pulse Rate:  [74-87] 79 (04/19 0819) Resp:  [13-23] 18 (04/19 0819) BP: (120-154)/(65-96) 134/65 (04/19 0819) SpO2:  [94 %-100 %] 99 % (04/19 0819) Weight:  [98 kg] 98 kg (04/18 1953)  Weight change:  Filed Weights   09/13/23 1953  Weight: 98 kg    Intake/Output: No intake/output data recorded.   Intake/Output this shift:  Total I/O In: 240 [P.O.:240] Out: -   Physical Exam: General: NAD  Head: Normocephalic, atraumatic. Moist oral mucosal membranes  Eyes: Anicteric  Lungs:  Clear to auscultation, normal effort  Heart: Regular rate and rhythm  Abdomen:  Soft, nontender,   Extremities: No peripheral edema.  Neurologic: Nonfocal, moving all four extremities  Skin: Multiple facial abrasions   Access: Left aVF    Basic Metabolic Panel: Recent Labs  Lab 09/13/23 2005  NA 139  K 2.9*  CL 92*  CO2 31  GLUCOSE 167*  BUN 15  CREATININE 4.70*  CALCIUM  9.9    Liver Function Tests: No results for input(s): "AST", "ALT", "ALKPHOS", "BILITOT", "PROT", "ALBUMIN" in the last 168 hours. No results for input(s): "LIPASE", "AMYLASE" in the last 168 hours. No results for input(s): "AMMONIA" in the last 168 hours.  CBC: Recent Labs  Lab 09/13/23 2005  WBC 9.5  NEUTROABS 7.4  HGB 10.7*  HCT 33.8*  MCV 84.3  PLT 263    Cardiac Enzymes: No results for input(s): "CKTOTAL", "CKMB", "CKMBINDEX", "TROPONINI" in the last 168 hours.  BNP: Invalid input(s): "POCBNP"  CBG: Recent Labs  Lab 09/14/23 0227 09/14/23 0820  GLUCAP 122* 99    Microbiology: Results for orders placed or performed during the hospital encounter of 03/24/21  Resp Panel by RT-PCR (Flu A&B, Covid) Nasopharyngeal Swab     Status: None   Collection Time: 03/24/21  9:37 PM   Specimen: Nasopharyngeal Swab; Nasopharyngeal(NP) swabs in vial transport medium  Result Value Ref Range Status   SARS Coronavirus 2 by RT PCR NEGATIVE NEGATIVE Final    Comment: (NOTE) SARS-CoV-2 target nucleic acids are NOT DETECTED.  The SARS-CoV-2 RNA is generally detectable in upper respiratory specimens during the acute phase of infection. The lowest concentration of SARS-CoV-2 viral copies this assay can detect is 138 copies/mL. A negative result does not preclude SARS-Cov-2 infection and should not be used as the sole basis for treatment  or other patient management decisions. A negative result may occur with  improper specimen collection/handling, submission of specimen other than nasopharyngeal swab, presence of viral mutation(s) within the areas targeted by this assay, and inadequate number of viral copies(<138 copies/mL). A negative result must be combined with clinical observations, patient history, and  epidemiological information. The expected result is Negative.  Fact Sheet for Patients:  BloggerCourse.com  Fact Sheet for Healthcare Providers:  SeriousBroker.it  This test is no t yet approved or cleared by the [place]  FDA and  has been authorized for detection and/or diagnosis of SARS-CoV-2 by FDA under an Emergency Use Authorization (EUA). This EUA will remain  in effect (meaning this test can be used) for the duration of the COVID-19 declaration under Section 564(b)(1) of the Act, 21 U.S.C.section 360bbb-3(b)(1), unless the authorization is terminated  or revoked sooner.       Influenza A by PCR NEGATIVE NEGATIVE Final   Influenza B by PCR NEGATIVE NEGATIVE Final    Comment: (NOTE) The Xpert Xpress SARS-CoV-2/FLU/RSV plus assay is intended as an aid in the diagnosis of influenza from Nasopharyngeal swab specimens and should not be used as a sole basis for treatment. Nasal washings and aspirates are unacceptable for Xpert Xpress SARS-CoV-2/FLU/RSV testing.  Fact Sheet for Patients: BloggerCourse.com  Fact Sheet for Healthcare Providers: SeriousBroker.it  This test is not yet approved or cleared by the [place]  FDA and has been authorized for detection and/or diagnosis of SARS-CoV-2 by FDA under an Emergency Use Authorization (EUA). This EUA will remain in effect (meaning this test can be used) for the duration of the COVID-19 declaration under Section 564(b)(1) of the Act, 21 U.S.C. section 360bbb-3(b)(1), unless the authorization is terminated or revoked.  Performed at [facility], [address] [place], [place] [address]   MRSA Next Gen by PCR, Nasal     Status: None   Collection Time: 03/25/21 12:30 AM   Specimen: Nasal Mucosa; Nasal Swab  Result Value Ref Range Status   MRSA by PCR Next Gen NOT DETECTED NOT DETECTED Final    Comment:  (NOTE) The GeneXpert MRSA Assay (FDA approved for NASAL specimens only), is one component of a comprehensive MRSA colonization surveillance program. It is not intended to diagnose MRSA infection nor to guide or monitor treatment for MRSA infections. Test performance is not FDA approved in patients less than 82 years old. Performed at [facility], [address] [place], [place] [address]     Coagulation Studies: No results for input(s): "LABPROT", "INR" in the last 72 hours.  Urinalysis: No results for input(s): "COLORURINE", "LABSPEC", "PHURINE", "GLUCOSEU", "HGBUR", "BILIRUBINUR", "KETONESUR", "PROTEINUR", "UROBILINOGEN", "NITRITE", "LEUKOCYTESUR" in the last 72 hours.  Invalid input(s): "APPERANCEUR"    Imaging: MR BRAIN WO CONTRAST Addendum Date: 09/14/2023 ADDENDUM REPORT: 09/14/2023 09:36 ADDENDUM: Study discussed by telephone with Dr. Classie Cruise on 09/14/2023 at 09:32 . On review of this exam no abnormal diffusion or acute infarct is identified. There are chronic appearing lacunar infarcts in the bilateral deep gray nuclei, bilateral pons. Some associated Wallerian degeneration. No evidence of cerebral edema. Major intracranial vascular flow voids are preserved. 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 And there is no intracranial mass effect or ventriculomegaly. Salient findings of the above were discussed wtih Dr. Classie Cruise. Electronically Signed   By: Marlise Simpers M.D.   On: 09/14/2023 09:36   Result Date: 09/14/2023 CLINICAL DATA:  ataxia, ongoing  for a while, concern for CVA EXAM: MRI HEAD WITHOUT CONTRAST TECHNIQUE: Multiplanar, multiecho pulse sequences of the brain and surrounding structures were obtained without intravenous contrast. COMPARISON:  Same day CT head. FINDINGS: Brain: Many acute left parietal infarcts. Associated edema without mass effect. No evidence of acute hemorrhage,  mass lesion, midline shift or hydrocephalus. Vascular: Major arterial flow voids are maintained at the skull base. Skull and upper cervical spine: Normal marrow signal. Sinuses/Orbits: Mostly clear sinuses.  No acute orbital findings. Other: No mastoid effusions. IMPRESSION: Multiple acute left parietal infarcts (posterior left MCA territory). Electronically Signed: By: Stevenson Elbe M.D. On: 09/14/2023 00:14   CT Head Wo Contrast Result Date: 09/13/2023 CLINICAL DATA:  Trauma, fall from truck with head and facial injury. Evaluation for hemorrhage in facial fractures. EXAM: CT HEAD WITHOUT CONTRAST CT MAXILLOFACIAL WITHOUT CONTRAST CT CERVICAL SPINE WITHOUT CONTRAST TECHNIQUE: Multidetector CT imaging of the head, cervical spine, and maxillofacial structures were performed using the standard protocol without intravenous contrast. Multiplanar CT image reconstructions of the cervical spine and maxillofacial structures were also generated. RADIATION DOSE REDUCTION: This exam was performed according to the departmental dose-optimization program which includes automated exposure control, adjustment of the mA and/or kV according to patient size and/or use of iterative reconstruction technique. COMPARISON:  CT head 05/11/2021. FINDINGS: CT HEAD FINDINGS Brain: No acute intracranial hemorrhage. No CT evidence of acute infarct. Nonspecific hypoattenuation in the periventricular and subcortical white matter favored to reflect chronic microvascular ischemic changes. Small remote infarct in the left corona radiata. No edema, mass effect, or midline shift. The basilar cisterns are patent. Ventricles: The ventricles are normal. Vascular: Atherosclerotic calcifications of the carotid siphons. No hyperdense vessel. Skull: No acute or aggressive finding. Other: Mastoid air cells are clear. CT MAXILLOFACIAL FINDINGS Osseous: Maxilla: Intact. Multiple chronically absent maxillary teeth with associated chronic bone loss.  Periapical lucencies of multiple remaining maxillary teeth. Dental caries. Pterygoid Plates: Intact Zygomatic Arch: Intact Orbits: Intact Ethmoid: Intact Sphenoid: Intact Frontal:Intact Mandible: Intact. No condylar dislocation. Multiple chronically absent mandibular teeth. Dental caries and periapical lucencies. Nasal: Intact Nasal Septum: Intact.  Leftward deviation of the nasal septum. Orbits: Globes are intact. Lenses are normally located. The extraocular muscles and optic nerve sheath complexes are unremarkable. Normal appearance of the retro bulbar fat. No significant preseptal soft tissue swelling. Sinuses: Mild mucosal thickening in the bilateral ethmoid sinuses and in the alveolar recesses of the bilateral maxillary sinuses. No air-fluid levels. Soft tissues: No acute findings of the visualized facial soft tissues. CT CERVICAL SPINE FINDINGS Alignment: Slight reversal of the normal cervical lordosis. No listhesis. No facet subluxation or dislocation. Skull base and vertebrae: No acute fracture. No primary bone lesion or focal pathologic process. Soft tissues and spinal canal: No prevertebral fluid or swelling. No visible canal hematoma. Disc levels: Intervertebral disc spaces are relatively maintained. Small disc bulges at multiple levels in the cervical spine. Mild spinal canal stenosis at C2-3. Mild-to-moderate spinal canal stenosis at C3-4 and C4-5. Facet arthrosis at multiple levels. No high-grade osseous foraminal stenosis. Upper chest: Negative. Other: None. IMPRESSION: No CT evidence of acute intracranial abnormality. No acute maxillofacial fracture. No acute fracture or traumatic malalignment of the cervical spine. Chronic microvascular ischemic changes. Small remote infarct in the left corona radiata. Degenerative changes of the cervical spine as above. Dental caries and periapical lucencies noted. Electronically Signed   By: Denny Flack M.D.   On: 09/13/2023 21:19   CT Maxillofacial Wo  Contrast Result Date: 09/13/2023 CLINICAL  DATA:  Trauma, fall from truck with head and facial injury. Evaluation for hemorrhage in facial fractures. EXAM: CT HEAD WITHOUT CONTRAST CT MAXILLOFACIAL WITHOUT CONTRAST CT CERVICAL SPINE WITHOUT CONTRAST TECHNIQUE: Multidetector CT imaging of the head, cervical spine, and maxillofacial structures were performed using the standard protocol without intravenous contrast. Multiplanar CT image reconstructions of the cervical spine and maxillofacial structures were also generated. RADIATION DOSE REDUCTION: This exam was performed according to the departmental dose-optimization program which includes automated exposure control, adjustment of the mA and/or kV according to patient size and/or use of iterative reconstruction technique. COMPARISON:  CT head 05/11/2021. FINDINGS: CT HEAD FINDINGS Brain: No acute intracranial hemorrhage. No CT evidence of acute infarct. Nonspecific hypoattenuation in the periventricular and subcortical white matter favored to reflect chronic microvascular ischemic changes. Small remote infarct in the left corona radiata. No edema, mass effect, or midline shift. The basilar cisterns are patent. Ventricles: The ventricles are normal. Vascular: Atherosclerotic calcifications of the carotid siphons. No hyperdense vessel. Skull: No acute or aggressive finding. Other: Mastoid air cells are clear. CT MAXILLOFACIAL FINDINGS Osseous: Maxilla: Intact. Multiple chronically absent maxillary teeth with associated chronic bone loss. Periapical lucencies of multiple remaining maxillary teeth. Dental caries. Pterygoid Plates: Intact Zygomatic Arch: Intact Orbits: Intact Ethmoid: Intact Sphenoid: Intact Frontal:Intact Mandible: Intact. No condylar dislocation. Multiple chronically absent mandibular teeth. Dental caries and periapical lucencies. Nasal: Intact Nasal Septum: Intact.  Leftward deviation of the nasal septum. Orbits: Globes are intact. Lenses are normally  located. The extraocular muscles and optic nerve sheath complexes are unremarkable. Normal appearance of the retro bulbar fat. No significant preseptal soft tissue swelling. Sinuses: Mild mucosal thickening in the bilateral ethmoid sinuses and in the alveolar recesses of the bilateral maxillary sinuses. No air-fluid levels. Soft tissues: No acute findings of the visualized facial soft tissues. CT CERVICAL SPINE FINDINGS Alignment: Slight reversal of the normal cervical lordosis. No listhesis. No facet subluxation or dislocation. Skull base and vertebrae: No acute fracture. No primary bone lesion or focal pathologic process. Soft tissues and spinal canal: No prevertebral fluid or swelling. No visible canal hematoma. Disc levels: Intervertebral disc spaces are relatively maintained. Small disc bulges at multiple levels in the cervical spine. Mild spinal canal stenosis at C2-3. Mild-to-moderate spinal canal stenosis at C3-4 and C4-5. Facet arthrosis at multiple levels. No high-grade osseous foraminal stenosis. Upper chest: Negative. Other: None. IMPRESSION: No CT evidence of acute intracranial abnormality. No acute maxillofacial fracture. No acute fracture or traumatic malalignment of the cervical spine. Chronic microvascular ischemic changes. Small remote infarct in the left corona radiata. Degenerative changes of the cervical spine as above. Dental caries and periapical lucencies noted. Electronically Signed   By: Denny Flack M.D.   On: 09/13/2023 21:19   CT Cervical Spine Wo Contrast Result Date: 09/13/2023 CLINICAL DATA:  Trauma, fall from truck with head and facial injury. Evaluation for hemorrhage in facial fractures. EXAM: CT HEAD WITHOUT CONTRAST CT MAXILLOFACIAL WITHOUT CONTRAST CT CERVICAL SPINE WITHOUT CONTRAST TECHNIQUE: Multidetector CT imaging of the head, cervical spine, and maxillofacial structures were performed using the standard protocol without intravenous contrast. Multiplanar CT image  reconstructions of the cervical spine and maxillofacial structures were also generated. RADIATION DOSE REDUCTION: This exam was performed according to the departmental dose-optimization program which includes automated exposure control, adjustment of the mA and/or kV according to patient size and/or use of iterative reconstruction technique. COMPARISON:  CT head 05/11/2021. FINDINGS: CT HEAD FINDINGS Brain: No acute intracranial hemorrhage. No CT evidence of  acute infarct. Nonspecific hypoattenuation in the periventricular and subcortical white matter favored to reflect chronic microvascular ischemic changes. Small remote infarct in the left corona radiata. No edema, mass effect, or midline shift. The basilar cisterns are patent. Ventricles: The ventricles are normal. Vascular: Atherosclerotic calcifications of the carotid siphons. No hyperdense vessel. Skull: No acute or aggressive finding. Other: Mastoid air cells are clear. CT MAXILLOFACIAL FINDINGS Osseous: Maxilla: Intact. Multiple chronically absent maxillary teeth with associated chronic bone loss. Periapical lucencies of multiple remaining maxillary teeth. Dental caries. Pterygoid Plates: Intact Zygomatic Arch: Intact Orbits: Intact Ethmoid: Intact Sphenoid: Intact Frontal:Intact Mandible: Intact. No condylar dislocation. Multiple chronically absent mandibular teeth. Dental caries and periapical lucencies. Nasal: Intact Nasal Septum: Intact.  Leftward deviation of the nasal septum. Orbits: Globes are intact. Lenses are normally located. The extraocular muscles and optic nerve sheath complexes are unremarkable. Normal appearance of the retro bulbar fat. No significant preseptal soft tissue swelling. Sinuses: Mild mucosal thickening in the bilateral ethmoid sinuses and in the alveolar recesses of the bilateral maxillary sinuses. No air-fluid levels. Soft tissues: No acute findings of the visualized facial soft tissues. CT CERVICAL SPINE FINDINGS Alignment:  Slight reversal of the normal cervical lordosis. No listhesis. No facet subluxation or dislocation. Skull base and vertebrae: No acute fracture. No primary bone lesion or focal pathologic process. Soft tissues and spinal canal: No prevertebral fluid or swelling. No visible canal hematoma. Disc levels: Intervertebral disc spaces are relatively maintained. Small disc bulges at multiple levels in the cervical spine. Mild spinal canal stenosis at C2-3. Mild-to-moderate spinal canal stenosis at C3-4 and C4-5. Facet arthrosis at multiple levels. No high-grade osseous foraminal stenosis. Upper chest: Negative. Other: None. IMPRESSION: No CT evidence of acute intracranial abnormality. No acute maxillofacial fracture. No acute fracture or traumatic malalignment of the cervical spine. Chronic microvascular ischemic changes. Small remote infarct in the left corona radiata. Degenerative changes of the cervical spine as above. Dental caries and periapical lucencies noted. Electronically Signed   By: Denny Flack M.D.   On: 09/13/2023 21:19     Medications:     [START ON 09/15/2023] amLODipine   10 mg Oral Daily   atorvastatin   20 mg Oral Daily   [START ON 09/16/2023] calcitRIOL   0.25 mcg Oral Once per day on Monday Wednesday Friday   [START ON 09/15/2023] chlorthalidone   25 mg Oral Daily   ferrous sulfate   325 mg Oral BID WC   heparin   5,000 Units Subcutaneous Q8H   insulin  aspart  0-15 Units Subcutaneous TID WC   insulin  aspart  0-5 Units Subcutaneous QHS   [START ON 09/15/2023] isosorbide  mononitrate  30 mg Oral Daily   levETIRAcetam   500 mg Oral BID   sodium bicarbonate   650 mg Oral BID   acetaminophen  **OR** acetaminophen  (TYLENOL ) oral liquid 160 mg/5 mL **OR** acetaminophen , HYDROcodone -acetaminophen , morphine  injection, ondansetron  **OR** ondansetron  (ZOFRAN ) IV  Assessment/ Plan:  [name] is a 63 y.o.  male with past medical history of diabetes, hypertension, nicotine  dependence,  dyslipidemia, and end stage renal disease on hemodialysis.   [facility]/MWF/left aVF  Acute CVA with gait disturbances, status post fall.  MRI confirms presence of multiple acute parietal infarcts.  Neurology consulted and will initiate Keppra  500 mg twice daily and follow-up with patient outpatient.  2.  End-stage renal disease on hemodialysis.  Last treatment received on Friday.  If patient remains inpatient, we will plan for dialysis on Monday.  3. Anemia of chronic kidney disease Lab Results  Component Value Date   HGB 10.7 (L) 09/13/2023    Hemoglobin within desired range.    4. Secondary Hyperparathyroidism: with outpatient labs: phosphorus 5.5, calcium  9.4 on 09/04/2023.   Lab Results  Component Value Date   CALCIUM  9.9 09/13/2023   PHOS 4.9 (H) 03/30/2021    Will monitor bone minerals during this admission.   LOS: 0 Jamen Loiseau 4/19/20251:47 PM

## 2023-09-14 NOTE — Evaluation (Signed)
 Occupational Therapy Evaluation Patient Details Name: TANIS BURNLEY MRN: [id number] DOB: Mar 28, 1961 Today's Date: 09/14/2023   History of Present Illness   YSIDRO RAMSAY is a 63 y.o. male with medical history significant for diabetes mellitu ESRD on dialysis MWF, hypertension, seizure disorder, tobacco use disorder, being admitted with an acute stroke. Presented for a fall after he fell over from a sitting position while sitting in his truck tailgate, sustaining multiple abrasions to the midline of his face. MRI brain showed multiple acute left parietal infarcts, posterior left MCA territory.    Clinical Impressions Mr Doolin was seen for OT evaluation this date. Prior to hospital admission, pt was IND. Pt lives with girlfriend in mobile home with 3 STE. Pt currently requires SUPERVISION for ADL t/f ~160 ft no AD use, mild balance deficits pt self-corrects with U support or stagger step. Improves to MOD I with RW use for additional ~160 ft.  MOD I standing grooming tasks. No skilled acute OT needs identified, will sign off. Upon hospital discharge, recommend no OT follow up.     If plan is discharge home, recommend the following:   Help with stairs or ramp for entrance     Functional Status Assessment   Patient has not had a recent decline in their functional status     Equipment Recommendations   Other (comment) (RW)     Recommendations for Other Services         Precautions/Restrictions   Precautions Precautions: Fall Restrictions Weight Bearing Restrictions Per Provider Order: No     Mobility Bed Mobility Overal bed mobility: Modified Independent                  Transfers Overall transfer level: Modified independent Equipment used: None                      Balance Overall balance assessment: Mild deficits observed, not formally tested                                         ADL either performed or assessed with  clinical judgement   ADL Overall ADL's : Needs assistance/impaired                                       General ADL Comments: SUPERVISION for ADL t/f no AD use, mild balance deficits pt self-corrects with U support or stagger step. MOD I standing grooming tasks.      Pertinent Vitals/Pain Pain Assessment Pain Assessment: No/denies pain     Extremity/Trunk Assessment Upper Extremity Assessment Upper Extremity Assessment: Overall WFL for tasks assessed   Lower Extremity Assessment Lower Extremity Assessment: LLE deficits/detail LLE Deficits / Details: mild difficulty heel to shin, otherwise [facility]       Communication Communication Communication: No apparent difficulties   Cognition Arousal: Alert Behavior During Therapy: WFL for tasks assessed/performed Cognition: No apparent impairments                               Following commands: Intact                  Home Living Family/patient expects to be discharged to:: Private residence Living Arrangements: Spouse/significant other Available Help at  Discharge: Family Type of Home: Mobile home Home Access: Stairs to enter Entrance Stairs-Number of Steps: 3 Entrance Stairs-Rails: Can reach both Home Layout: One level               Home Equipment: None          Prior Functioning/Environment Prior Level of Function : Independent/Modified Independent;Driving                    OT Problem List: Impaired balance (sitting and/or standing)        OT Goals(Current goals can be found in the care plan section)   Acute Rehab OT Goals Patient Stated Goal: to go home OT Goal Formulation: With patient/family Time For Goal Achievement: 09/14/23 Potential to Achieve Goals: Good   AM-PAC OT "6 Clicks" Daily Activity     Outcome Measure Help from another person eating meals?: None Help from another person taking care of personal grooming?: None Help from another person  toileting, which includes using toliet, bedpan, or urinal?: None Help from another person bathing (including washing, rinsing, drying)?: A Little Help from another person to put on and taking off regular upper body clothing?: None Help from another person to put on and taking off regular lower body clothing?: None 6 Click Score: 23   End of Session Equipment Utilized During Treatment: Gait belt Nurse Communication: Mobility status  Activity Tolerance: Patient tolerated treatment well Patient left: in bed;with call bell/phone within reach;with bed alarm set;with family/visitor present  OT Visit Diagnosis: Unsteadiness on feet (R26.81)                Time: 1610-9604 OT Time Calculation (min): 18 min Charges:  OT General Charges $OT Visit: 1 Visit OT Evaluation $OT Eval Low Complexity: 1 Low  Gordan Latina, M.S. OTR/L  09/14/23, 9:39 AM  ascom [phone]

## 2023-09-14 NOTE — Assessment & Plan Note (Addendum)
 Patient states he has not taken his medication in about a year Will get Keppra  level Resume Keppra  twice daily Follow-up EEG

## 2023-09-14 NOTE — TOC Transition Note (Signed)
 Transition of Care [facility]) - Discharge Note   Patient Details  Name: Chase Rodriguez MRN: [id number] Date of Birth: 1961/01/08  Transition of Care [facility]) CM/SW Contact:  Areta Beer, RN Phone Number: 09/14/2023, 11:30 AM   Clinical Narrative:  4/19: Patient will be discharging to home with home health. RN CM spoke with patient who id not have a preference for home health agency and had not had home health before. Does not not have DME at home. PCP is [facility] per patient. Gasper Karst accepted for home health Pt and [employer] for DME RW which will be delivered to the room prior to discharge. Patient will transport home on discharge with brother, he stated. NO SDOH alerts noted on this admission.    Katheryn Pandy MSN RN CM  RN Case Manager [place]  Transitions of Care Direct Dial: [phone] (Weekends Only) [facility] Main Office Phone: [phone] [facility] Fax: [phone] [email or web address]     Final next level of care: Home w Home Health Services Barriers to Discharge: No Barriers Identified   Patient Goals and CMS Choice   CMS Medicare.gov Compare Post Acute Care list provided to:: Patient Choice offered to / list presented to : Patient      Discharge Placement                       Discharge Plan and Services Additional resources added to the After Visit Summary for                  DME Arranged: Walker rolling DME Agency: [facility] Date DME Agency Contacted: 09/14/23 Time DME Agency Contacted: 1129 Representative spoke with at DME Agency: Hamilton Levin HH Arranged: PT HH Agency: [facility] Date [facility] Agency Contacted: 09/14/23 Time HH Agency Contacted: 1129 Representative spoke with at [facility] Agency: Randel Buss  Social Drivers of Health (SDOH) Interventions SDOH Screenings   Food Insecurity: No Food Insecurity (09/14/2023)  Housing: Low Risk  (09/14/2023)  Transportation Needs: No Transportation Needs (09/14/2023)  Utilities: Not At Risk (09/14/2023)  Financial  Resource Strain: Low Risk  (07/29/2023)  Tobacco Use: High Risk (09/13/2023)  Health Literacy: Adequate Health Literacy (07/29/2023)     Readmission Risk Interventions     No data to display

## 2023-09-14 NOTE — Consult Note (Addendum)
 NEUROLOGY CONSULT NOTE   Date of service: September 14, 2023 Patient Name: Chase Rodriguez MRN:  [id number] DOB:  May 08, 1961 Chief Complaint: "fall" Requesting Provider: Tiajuana Fluke, MD  History of Present Illness  Chase Rodriguez is a 63 y.o. male with hx of DM2, ESRD on HD, HTN, seizures and non compliant with AEDs, smoking, who fell from the bed of a truck. Has poor recollection of the event. He was brought in to the ED where he was noted to have multiple abrasions on his face. He had dialysis earlier and was hypotensive afterwards. CT head and face and C spine with no fractures. Upon getting him to walk, was noted to have difficulty with walking and ataxia. MRI Brain was initially read as left MCA stroke but I did not notice any strokes.  He is also supposed to be on keppra  500mg  BID but is non compliant.  LKW: non, no stroke Modified rankin score: 1-No significant post stroke disability and can perform usual duties with stroke symptoms IV Thrombolysis: not offered, no stroke EVT: not offered, no stroke  NIHSS components Score: Comment  1a Level of Conscious 0[x]  1[]  2[]  3[]      1b LOC Questions 0[x]  1[]  2[]       1c LOC Commands 0[x]  1[]  2[]       2 Best Gaze 0[x]  1[]  2[]       3 Visual 0[x]  1[]  2[]  3[]      4 Facial Palsy 0[x]  1[]  2[]  3[]      5a Motor Arm - left 0[x]  1[]  2[]  3[]  4[]  UN[]    5b Motor Arm - Right 0[x]  1[]  2[]  3[]  4[]  UN[]    6a Motor Leg - Left 0[x]  1[]  2[]  3[]  4[]  UN[]    6b Motor Leg - Right 0[x]  1[]  2[]  3[]  4[]  UN[]    7 Limb Ataxia 0[x]  1[]  2[]  3[]  UN[]     8 Sensory 0[x]  1[]  2[]  UN[]      9 Best Language 0[x]  1[]  2[]  3[]      10 Dysarthria 0[x]  1[]  2[]  UN[]      11 Extinct. and Inattention 0[x]  1[]  2[]       TOTAL: 0      ROS  Comprehensive ROS performed and pertinent positives documented in HPI   Past History   Past Medical History:  Diagnosis Date   Diabetes mellitus without complication (HCC)    Hepatitis    High cholesterol    History of kidney  stones    Hypertension    Stage 3b chronic kidney disease (CKD) (HCC)     Past Surgical History:  Procedure Laterality Date   AV FISTULA PLACEMENT Left 06/14/2022   Procedure: ARTERIOVENOUS (AV) FISTULA CREATION (BRACHIAL CEPHALIC);  Surgeon: Celso College, MD;  Location: [facility] ORS;  Service: Vascular;  Laterality: Left;   TONSILLECTOMY      Family History: Family History  Problem Relation Age of Onset   Diabetes Mother    Vascular Disease Father     Social History  reports that he has been smoking cigarettes. He has a 19.5 pack-year smoking history. He has never used smokeless tobacco. He reports current alcohol use. He reports that he does not use drugs.  No Known Allergies  Medications   Current Facility-Administered Medications:    [START ON 09/15/2023]  stroke: early stages of recovery book, , Does not apply, Once, Lanetta Pion, MD   acetaminophen  (TYLENOL ) tablet 650 mg, 650 mg, Oral, Q4H PRN **OR** acetaminophen  (TYLENOL ) 160 MG/5ML solution 650 mg, 650 mg, Per  Tube, Q4H PRN **OR** acetaminophen  (TYLENOL ) suppository 650 mg, 650 mg, Rectal, Q4H PRN, Lanetta Pion, MD   clopidogrel  (PLAVIX ) tablet 75 mg, 75 mg, Oral, Daily, Sreenath, Sudheer B, MD   heparin  injection 5,000 Units, 5,000 Units, Subcutaneous, Q8H, Vallarie Gauze, Herbie Loll, MD   HYDROcodone -acetaminophen  (NORCO/VICODIN) 5-325 MG per tablet 1-2 tablet, 1-2 tablet, Oral, Q4H PRN, Lanetta Pion, MD   insulin  aspart (novoLOG ) injection 0-15 Units, 0-15 Units, Subcutaneous, TID WC, Lanetta Pion, MD   insulin  aspart (novoLOG ) injection 0-5 Units, 0-5 Units, Subcutaneous, QHS, Lanetta Pion, MD   morphine  (PF) 2 MG/ML injection 2 mg, 2 mg, Intravenous, Q2H PRN, Lanetta Pion, MD   ondansetron  (ZOFRAN ) tablet 4 mg, 4 mg, Oral, Q6H PRN **OR** ondansetron  (ZOFRAN ) injection 4 mg, 4 mg, Intravenous, Q6H PRN, Lanetta Pion, MD  Vitals   Vitals:   09/14/23 0230 09/14/23 0312 09/14/23 0533 09/14/23 0819  BP: 135/73  (!) 120/96 (!) 143/67 134/65  Pulse: 74 87 76 79  Resp: 16 19 18 18   Temp: (!) 97.4 F (36.3 C) 98.6 F (37 C) 98.5 F (36.9 C) 98.2 F (36.8 C)  TempSrc: Oral Oral Oral   SpO2: 95% 100% 98% 99%  Weight:      Height:        Body mass index is 30.13 kg/m.  Physical Exam   General: Laying comfortably in bed; in no acute distress.  HENT: Normal oropharynx and mucosa. Normal external appearance of ears and nose.  Neck: Supple, no pain or tenderness  CV: No JVD. No peripheral edema.  Pulmonary: Symmetric Chest rise. Normal respiratory effort.  Abdomen: Soft to touch, non-tender.  Ext: No cyanosis, edema, or deformity  Skin: No rash. Normal palpation of skin.   Musculoskeletal: Normal digits and nails by inspection. No clubbing.   Neurologic Examination  Mental status/Cognition: Alert, oriented to self, place, month and year, good attention.  Speech/language: Fluent, comprehension intact, object naming intact, repetition intact.  Cranial nerves:   CN II Pupils equal and reactive to light, no VF deficits    CN III,IV,VI EOM intact, no gaze preference or deviation, no nystagmus    CN V normal sensation in V1, V2, and V3 segments bilaterally    CN VII no asymmetry, no nasolabial fold flattening    CN VIII normal hearing to speech    CN IX & X normal palatal elevation, no uvular deviation    CN XI 5/5 head turn and 5/5 shoulder shrug bilaterally    CN XII midline tongue protrusion    Motor:  Muscle bulk: normal, tone normal, pronator drift noted in LUE. tremor none Mvmt Root Nerve  Muscle Right Left Comments  SA C5/6 Ax Deltoid 5 5   EF C5/6 Mc Biceps 5 5   EE C6/7/8 Rad Triceps 5 4+   WF C6/7 Med FCR     WE C7/8 PIN ECU     F Ab C8/T1 U ADM/FDI 5 5   HF L1/2/3 Fem Illopsoas 5 4+   KE L2/3/4 Fem Quad 5 5   DF L4/5 D Peron Tib Ant 5 5   PF S1/2 Tibial Grc/Sol 5 5    Sensation:  Light touch Intact throughout   Pin prick    Temperature    Vibration   Proprioception     Coordination/Complex Motor:  - Finger to Nose intact BL - Heel to shin intact bilaterally - Rapid alternating movement are slowed in left upper extremity. - Gait:  Stride length normal. Arm swing poor. Base width narrow.  Able to toe walk and heel walk.  Has some difficulty with tandem walking due to left leg weakness.  Labs/Imaging/Neurodiagnostic studies   CBC:  Recent Labs  Lab 2023/09/28 2005  WBC 9.5  NEUTROABS 7.4  HGB 10.7*  HCT 33.8*  MCV 84.3  PLT 263   Basic Metabolic Panel:  Lab Results  Component Value Date   NA 139 09/28/2023   K 2.9 (L) 2023-09-28   CO2 31 09/28/23   GLUCOSE 167 (H) 09/28/2023   BUN 15 Sep 28, 2023   CREATININE 4.70 (H) 2023-09-28   CALCIUM  9.9 09/28/23   GFRNONAA 13 (L) Sep 28, 2023   Lipid Panel:  Lab Results  Component Value Date   LDLCALC 126 (H) 09/14/2023   HgbA1c:  Lab Results  Component Value Date   HGBA1C 12.5 (H) 03/25/2021   Urine Drug Screen:     Component Value Date/Time   LABOPIA NONE DETECTED 03/25/2021 0130   COCAINSCRNUR NONE DETECTED 03/25/2021 0130   LABBENZ NONE DETECTED 03/25/2021 0130   AMPHETMU NONE DETECTED 03/25/2021 0130   THCU NONE DETECTED 03/25/2021 0130   LABBARB NONE DETECTED 03/25/2021 0130    Alcohol Level     Component Value Date/Time   ETH <10 09-28-2023 2005   INR  Lab Results  Component Value Date   INR 1.0 03/24/2021   APTT  Lab Results  Component Value Date   APTT 37 (H) 03/24/2021   AED levels: No results found for: "PHENYTOIN", "ZONISAMIDE", "LAMOTRIGINE", "LEVETIRACETA"  CT Head without contrast(Personally reviewed): CTH was negative for a large hypodensity concerning for a large territory infarct or hyperdensity concerning for an ICH  MRI Brain(Personally reviewed): No acute stroke, chronic subdural hematoma stable.  ASSESSMENT   Chase Rodriguez is a 63 y.o. male with hx of DM2, ESRD on HD, HTN, seizures and non compliant with AEDs, smoking, who fell from the bed of a  truck and has poor recollection of the event.  He was hypotensive postdialysis yesterday.  He also has a history of seizures and is noncompliant with Keppra .  He has not taken Keppra  in a year.  Trauma imaging in the ED with CT head, CT face and CT C-spine were negative for any acute intracranial abnormalities.  MRI of the brain was initially read as an acute left posterior MCA distribution stroke.  However upon review of the MRI, I did not see an acute stroke.  On my exam today, he has known slight left leg weakness with mild incoordination but that is chronic and residual from his prior injuries.  He is able to toe walk and heel walk but has some difficulty with tandem walking.  I suspect that he probably had a seizure and therefore has poor recollection of the event.  However, the clear provoking factor for his seizures likely noncompliance with his Keppra .  RECOMMENDATIONS  -Continue Keppra  500 mg twice daily. -I discussed with patient and significant other at the bedside that patient should not drive for 6 months.  He has to be free of any seizures before he can resume driving. -Full seizure precautions listed at the bottom of this note.  Please have them placed in the discharge instructions. -Patient should also follow-up with neurology outpatient. -Neurology inpatient team will sign off.  Please feel free to contact us  with any questions or concerns. ______________________________________________________________________  Plan was discussed in detail with the patient, his wife at the bedside and with Dr. Mosetta Areola with  the hospitalist team.  Signed, Arael Piccione, MD [facility]  Seizure precautions: Per [place]  DMV statutes, patients with seizures are not allowed to drive until they have been seizure-free for six months and cleared by a physician    Use caution when using heavy equipment or power tools. Avoid working on ladders or at heights. Take showers instead of  baths. Ensure the water temperature is not too high on the home water heater. Do not go swimming alone. Do not lock yourself in a room alone (i.e. bathroom). When caring for infants or small children, sit down when holding, feeding, or changing them to minimize risk of injury to the child in the event you have a seizure. Maintain good sleep hygiene. Avoid alcohol.    If patient has another seizure, call 911 and bring them back to the ED if: A.  The seizure lasts longer than 5 minutes.      B.  The patient doesn't wake shortly after the seizure or has new problems such as difficulty seeing, speaking or moving following the seizure C.  The patient was injured during the seizure D.  The patient has a temperature over 102 F (39C) E.  The patient vomited during the seizure and now is having trouble breathing    During the Seizure   - First, ensure adequate ventilation and place patients on the floor on their left side  Loosen clothing around the neck and ensure the airway is patent. If the patient is clenching the teeth, do not force the mouth open with any object as this can cause severe damage - Remove all items from the surrounding that can be hazardous. The patient may be oblivious to what's happening and may not even know what he or she is doing. If the patient is confused and wandering, either gently guide him/her away and block access to outside areas - Reassure the individual and be comforting - Call 911. In most cases, the seizure ends before EMS arrives. However, there are cases when seizures may last over 3 to 5 minutes. Or the individual may have developed breathing difficulties or severe injuries. If a pregnant patient or a person with diabetes develops a seizure, it is prudent to call an ambulance. - Finally, if the patient does not regain full consciousness, then call EMS. Most patients will remain confused for about 45 to 90 minutes after a seizure, so you must use judgment in calling for  help. - Avoid restraints but make sure the patient is in a bed with padded side rails - Place the individual in a lateral position with the neck slightly flexed; this will help the saliva drain from the mouth and prevent the tongue from falling backward - Remove all nearby furniture and other hazards from the area - Provide verbal assurance as the individual is regaining consciousness - Provide the patient with privacy if possible - Call for help and start treatment as ordered by the caregiver    After the Seizure (Postictal Stage)   After a seizure, most patients experience confusion, fatigue, muscle pain and/or a headache. Thus, one should permit the individual to sleep. For the next few days, reassurance is essential. Being calm and helping reorient the person is also of importance.   Most seizures are painless and end spontaneously. Seizures are not harmful to others but can lead to complications such as stress on the lungs, brain and the heart. Individuals with prior lung problems may develop labored breathing and respiratory distress.

## 2023-09-14 NOTE — Assessment & Plan Note (Addendum)
 Anemia of ESRD Nephrology consult for continuation of dialysis Hemoglobin at baseline

## 2023-09-14 NOTE — Progress Notes (Signed)
 [facility] representative approached the nurse's station advising that the pt's walker was delivered to Room 106a; upon entering the pt's room there was no one in the room and the pt's belongings were not on the counter; went to the [facility] entrance looking for the pt and his family member, unable to locate the pt and/or his family member; the unit clerk called the contact number for the pt's emergency contact and spoke with George Kinder who told the unit clerk that she came to visit the pt and upon entering the [facility] entrance, the pt and the other family member were walking out the [facility] entrance verbalizing they were ready to go; the unit clerk advised Shelvy Dickens that the pt was waiting on DME/rolling walker and that he needs to come back and get it; the pt verbalized that he did not want the walker

## 2023-09-14 NOTE — Assessment & Plan Note (Signed)
 Wound care Verify tetanus shot status Pain control

## 2023-09-14 NOTE — Progress Notes (Signed)
 MD order received in [facility] to discharge pt home with home health today; TOC previously established Home Health PT with [facility], DME/rolling walker to be delivered to the pt's room; verbally reviewed AVS with pt and the family member at the bedside; verbally instructed them to use the nurse callbell when the rolling walker is delivered to the room, they will need to call the pt's brother, Khalen Styer and advise them that the pt is ready for discharge and to come pick him up; discharge pending delivery of DME

## 2023-09-14 NOTE — Evaluation (Signed)
 Physical Therapy Evaluation Patient Details Name: Chase Rodriguez MRN: [id number] DOB: 08-27-1960 Today's Date: 09/14/2023  History of Present Illness  Chase Rodriguez is a 63 y.o. male with medical history significant for diabetes mellitu ESRD on dialysis MWF, hypertension, seizure disorder, tobacco use disorder, being admitted with an acute stroke. Presented for a fall after he fell over from a sitting position while sitting in his truck tailgate, sustaining multiple abrasions to the midline of his face. MRI brain showed multiple acute left parietal infarcts, posterior left MCA territory.  Clinical Impression  Responded to room due to bed alarm going off; patient noted to have ambulated self to bathroom.  Assisted, as needed, with task and returned to bed for initiation of evaluation. Patient alert and oriented to basic information; follows commands and agreeable to participation with session.  Follows commands, pleasant and cooperative; generally flat affect.  Denies pain; denies acute change in functional status.  Assessment significant for L UE (4-/5)  > LE (4/5) weakness and fine motor/coordination deficits; mild L visual inattention with all functional tasks.  Patient appears grossly unaware of deficits; unable to describe whether this is a change or baseline.  Higher-level balance deficits noted (BERG 40/56); fair insight into deficits and use of compensatory strategies as needed to maximize safety. Able to complete bed mobility with mod indep; sit/stand, basic transfers and gait (200') without assist device.  Demonstrates reciprocal stepping pattern with decreased cadence/gait speed; mildly excessive L LE ER with intermittent foot drag and persistent L genu recurvatum in loading phases of gait cycle. Limited trunk rotation and arm swing (L > R), R visual preference noted. Mild sway/deviation with head turns, LE step strategy for balance recovery.  Did trial RW, but minimal change in gait  performance/level of assist noted; patient prefers to continue without at this time. Would benefit from skilled PT to address above deficits and promote optimal return to PLOF.; recommend post-acute PT follow up as indicated by interdisciplinary care team.          If plan is discharge home, recommend the following: A little help with walking and/or transfers;A little help with bathing/dressing/bathroom   Can travel by private vehicle        Equipment Recommendations    Recommendations for Other Services       Functional Status Assessment Patient has had a recent decline in their functional status and demonstrates the ability to make significant improvements in function in a reasonable and predictable amount of time.     Precautions / Restrictions Precautions Precautions: Fall Restrictions Weight Bearing Restrictions Per Provider Order: No      Mobility  Bed Mobility Overal bed mobility: Modified Independent                  Transfers Overall transfer level: Needs assistance Equipment used: None, Rolling walker (2 wheels) Transfers: Sit to/from Stand Sit to Stand: Contact guard assist, Supervision           General transfer comment: sup with and without RW, minimal change in performance, level of assist/supervision required    Ambulation/Gait Ambulation/Gait assistance: Supervision Gait Distance (Feet): 200 Feet Assistive device: None         General Gait Details: reciprocal stepping pattern with decreased cadence/gait speed; mildly excessive L LE ER with intermittent foot drag and persistent L genu recurvatum in loading phases of gait cycle.  Limited trunk rotation and arm swing (L > R), R visual preference noted.  Mild sway/deviation with head turns,  LE step strategy for balance recovery  Stairs            Wheelchair Mobility     Tilt Bed    Modified Rankin (Stroke Patients Only)       Balance Overall balance assessment: Needs  assistance Sitting-balance support: No upper extremity supported, Feet supported Sitting balance-Leahy Scale: Good     Standing balance support: No upper extremity supported Standing balance-Leahy Scale: Fair                   Standardized Balance Assessment Standardized Balance Assessment : Berg Balance Test Berg Balance Test Sit to Stand: Able to stand without using hands and stabilize independently Standing Unsupported: Able to stand safely 2 minutes Sitting with Back Unsupported but Feet Supported on Floor or Stool: Able to sit safely and securely 2 minutes Stand to Sit: Sits safely with minimal use of hands Transfers: Able to transfer safely, minor use of hands Standing Unsupported with Eyes Closed: Able to stand 10 seconds with supervision Standing Ubsupported with Feet Together: Able to place feet together independently but unable to hold for 30 seconds From Standing, Reach Forward with Outstretched Arm: Can reach forward >12 cm safely (5") From Standing Position, Pick up Object from Floor: Able to pick up shoe safely and easily From Standing Position, Turn to Look Behind Over each Shoulder: Turn sideways only but maintains balance Turn 360 Degrees: Needs close supervision or verbal cueing Standing Unsupported, Alternately Place Feet on Step/Stool: Able to complete >2 steps/needs minimal assist Standing Unsupported, One Foot in Front: Able to take small step independently and hold 30 seconds Standing on One Leg: Able to lift leg independently and hold equal to or more than 3 seconds Total Score: 40         Pertinent Vitals/Pain Pain Assessment Pain Assessment: No/denies pain    Home Living Family/patient expects to be discharged to:: Private residence Living Arrangements: Spouse/significant other Available Help at Discharge: Family Type of Home: Mobile home Home Access: Stairs to enter Entrance Stairs-Rails: Can reach both Entrance Stairs-Number of Steps: 3    Home Layout: One level Home Equipment: None      Prior Function Prior Level of Function : Independent/Modified Independent;Driving                     Extremity/Trunk Assessment   Upper Extremity Assessment Upper Extremity Assessment: Right hand dominant;LUE deficits/detail LUE Deficits / Details: L UE grossly 4-/5 throughout; mild coordination/fine motor deficit (requiring increased time to complete functional tasks).  Denies sensory deficit    Lower Extremity Assessment Lower Extremity Assessment: LLE deficits/detail LLE Deficits / Details: L LE grossly 4 to 4+/5 throughout; denies sensory deficit    Cervical / Trunk Assessment Cervical / Trunk Assessment:  (mild preference for cervical rotation/visual attention to R)  Communication   Communication Communication: No apparent difficulties    Cognition Arousal: Alert Behavior During Therapy: WFL for tasks assessed/performed, Flat affect                             Following commands: Intact       Cueing       General Comments      Exercises Other Exercises Other Exercises: Gait x200' with RW, cga/close sup-minimal change in gait peformance or mechanics with use of RW; patient prefers mobility without   Assessment/Plan    PT Assessment Patient needs continued PT services  PT  Problem List Decreased strength;Decreased balance;Decreased activity tolerance;Decreased mobility;Decreased cognition;Decreased coordination;Decreased knowledge of use of DME;Decreased safety awareness;Decreased knowledge of precautions       PT Treatment Interventions DME instruction;Gait training;Stair training;Functional mobility training;Therapeutic activities;Therapeutic exercise;Balance training;Neuromuscular re-education;Cognitive remediation;Patient/family education    PT Goals (Current goals can be found in the Care Plan section)  Acute Rehab PT Goals Patient Stated Goal: to return home PT Goal Formulation: With  patient/family Time For Goal Achievement: 09/28/23 Potential to Achieve Goals: Good    Frequency Min 2X/week     Co-evaluation               AM-PAC PT "6 Clicks" Mobility  Outcome Measure Help needed turning from your back to your side while in a flat bed without using bedrails?: None Help needed moving from lying on your back to sitting on the side of a flat bed without using bedrails?: None Help needed moving to and from a bed to a chair (including a wheelchair)?: None Help needed standing up from a chair using your arms (e.g., wheelchair or bedside chair)?: None Help needed to walk in hospital room?: None Help needed climbing 3-5 steps with a railing? : A Little 6 Click Score: 23    End of Session   Activity Tolerance: Patient tolerated treatment well Patient left: in bed;with call bell/phone within reach;with bed alarm set;with family/visitor present Nurse Communication: Mobility status PT Visit Diagnosis: Muscle weakness (generalized) (M62.81);Difficulty in walking, not elsewhere classified (R26.2);Other symptoms and signs involving the nervous system (R29.898)    Time: 2841-3244 PT Time Calculation (min) (ACUTE ONLY): 22 min   Charges:   PT Evaluation $PT Eval Moderate Complexity: 1 Mod   PT General Charges $$ ACUTE PT VISIT: 1 Visit         Etherine Mackowiak H. Bevin Bucks, PT, DPT, NCS 09/14/23, 11:00 AM [phone]

## 2023-09-16 ENCOUNTER — Ambulatory Visit: Admitting: Oncology

## 2023-09-16 ENCOUNTER — Ambulatory Visit

## 2023-09-16 LAB — LEVETIRACETAM LEVEL: Levetiracetam Lvl: 8.6 ug/mL — ABNORMAL LOW (ref 10.0–40.0)

## 2023-09-25 ENCOUNTER — Other Ambulatory Visit: Payer: Self-pay

## 2024-04-17 ENCOUNTER — Encounter: Payer: Self-pay | Admitting: Oncology

## 2024-04-30 ENCOUNTER — Ambulatory Visit: Admitting: Podiatry

## 2024-04-30 ENCOUNTER — Encounter: Payer: Self-pay | Admitting: Oncology

## 2024-04-30 DIAGNOSIS — M79675 Pain in left toe(s): Secondary | ICD-10-CM | POA: Diagnosis not present

## 2024-04-30 DIAGNOSIS — M79674 Pain in right toe(s): Secondary | ICD-10-CM | POA: Diagnosis not present

## 2024-04-30 DIAGNOSIS — B351 Tinea unguium: Secondary | ICD-10-CM | POA: Diagnosis not present

## 2024-04-30 NOTE — Progress Notes (Signed)
  Subjective:  Patient ID: Chase Rodriguez, male    DOB: [birth year],  MRN: [id number]  Chief Complaint  Patient presents with   Nail Problem    Nail trim    63 y.o. male returns for the above complaint.  Patient presents with thickened onychodystrophy mycotic toenails x 10 mild pain on palpation worse with ambulation or shoe pressure  Objective:  There were no vitals filed for this visit. Podiatric Exam: Vascular: dorsalis pedis and posterior tibial pulses are palpable bilateral. Capillary return is immediate. Temperature gradient is WNL. Skin turgor WNL  Sensorium: Normal Semmes Weinstein monofilament test. Normal tactile sensation bilaterally. Nail Exam: Pt has thick disfigured discolored nails with subungual debris noted bilateral entire nail hallux through fifth toenails.  Pain on palpation to the nails. Ulcer Exam: There is no evidence of ulcer or pre-ulcerative changes or infection. Orthopedic Exam: Muscle tone and strength are WNL. No limitations in general ROM. No crepitus or effusions noted.  Skin: No Porokeratosis. No infection or ulcers    Assessment & Plan:   1. Pain due to onychomycosis of toenails of both feet     Patient was evaluated and treated and all questions answered.  Onychomycosis with pain  -Nails palliatively debrided as below. -Educated on self-care  Procedure: Nail Debridement Rationale: pain  Type of Debridement: manual, sharp debridement. Instrumentation: Nail nipper, rotary burr. Number of Nails: 10  Procedures and Treatment: Consent by patient was obtained for treatment procedures. The patient understood the discussion of treatment and procedures well. All questions were answered thoroughly reviewed. Debridement of mycotic and hypertrophic toenails, 1 through 5 bilateral and clearing of subungual debris. No ulceration, no infection noted.  Return Visit-Office Procedure: Patient instructed to return to the office for a follow up visit 3 months for  continued evaluation and treatment.  Franky Blanch, DPM    Return in about 3 months (around 07/29/2024) for [facility].

## 2024-07-30 ENCOUNTER — Ambulatory Visit: Admitting: Podiatry
